# Patient Record
Sex: Male | Born: 1970 | Race: White | Hispanic: No | Marital: Married | Smoking: Former smoker
Health system: Southern US, Community
[De-identification: ages and names within clinical notes are randomized; demographics above are authoritative.]

## PROBLEM LIST (undated history)

## (undated) DIAGNOSIS — M1990 Unspecified osteoarthritis, unspecified site: Secondary | ICD-10-CM

## (undated) DIAGNOSIS — R002 Palpitations: Secondary | ICD-10-CM

## (undated) DIAGNOSIS — E785 Hyperlipidemia, unspecified: Secondary | ICD-10-CM

## (undated) DIAGNOSIS — F419 Anxiety disorder, unspecified: Secondary | ICD-10-CM

## (undated) DIAGNOSIS — M545 Low back pain, unspecified: Secondary | ICD-10-CM

## (undated) DIAGNOSIS — R5381 Other malaise: Secondary | ICD-10-CM

## (undated) DIAGNOSIS — F32A Depression, unspecified: Secondary | ICD-10-CM

## (undated) DIAGNOSIS — R12 Heartburn: Secondary | ICD-10-CM

## (undated) DIAGNOSIS — E559 Vitamin D deficiency, unspecified: Secondary | ICD-10-CM

## (undated) DIAGNOSIS — F329 Major depressive disorder, single episode, unspecified: Secondary | ICD-10-CM

## (undated) DIAGNOSIS — R5383 Other fatigue: Secondary | ICD-10-CM

## (undated) DIAGNOSIS — R319 Hematuria, unspecified: Secondary | ICD-10-CM

## (undated) DIAGNOSIS — K219 Gastro-esophageal reflux disease without esophagitis: Secondary | ICD-10-CM

## (undated) DIAGNOSIS — R7989 Other specified abnormal findings of blood chemistry: Secondary | ICD-10-CM

## (undated) DIAGNOSIS — E119 Type 2 diabetes mellitus without complications: Secondary | ICD-10-CM

## (undated) HISTORY — DX: Other malaise: R53.81

## (undated) HISTORY — DX: Heartburn: R12

## (undated) HISTORY — DX: Other fatigue: R53.83

## (undated) HISTORY — DX: Palpitations: R00.2

## (undated) HISTORY — DX: Low back pain: M54.5

## (undated) HISTORY — DX: Other specified abnormal findings of blood chemistry: R79.89

## (undated) HISTORY — DX: Hematuria, unspecified: R31.9

## (undated) HISTORY — DX: Low back pain, unspecified: M54.50

## (undated) HISTORY — DX: Hyperlipidemia, unspecified: E78.5

## (undated) HISTORY — DX: Vitamin D deficiency, unspecified: E55.9

---

## 2002-02-11 ENCOUNTER — Encounter: Admission: RE | Admit: 2002-02-11 | Discharge: 2002-02-11 | Payer: Self-pay | Admitting: *Deleted

## 2002-02-11 ENCOUNTER — Encounter: Payer: Self-pay | Admitting: *Deleted

## 2003-03-25 ENCOUNTER — Encounter
Admission: RE | Admit: 2003-03-25 | Discharge: 2003-06-23 | Payer: Self-pay | Admitting: Physical Medicine and Rehabilitation

## 2004-02-26 ENCOUNTER — Ambulatory Visit: Payer: Self-pay | Admitting: Internal Medicine

## 2004-03-26 ENCOUNTER — Ambulatory Visit: Payer: Self-pay | Admitting: Internal Medicine

## 2004-11-30 ENCOUNTER — Ambulatory Visit: Payer: Self-pay | Admitting: Internal Medicine

## 2005-04-29 ENCOUNTER — Ambulatory Visit: Payer: Self-pay | Admitting: Internal Medicine

## 2009-09-18 ENCOUNTER — Ambulatory Visit: Payer: Self-pay | Admitting: Internal Medicine

## 2009-09-18 DIAGNOSIS — M5137 Other intervertebral disc degeneration, lumbosacral region: Secondary | ICD-10-CM

## 2009-09-18 DIAGNOSIS — S43429A Sprain of unspecified rotator cuff capsule, initial encounter: Secondary | ICD-10-CM

## 2009-09-18 DIAGNOSIS — K219 Gastro-esophageal reflux disease without esophagitis: Secondary | ICD-10-CM

## 2009-09-18 DIAGNOSIS — G894 Chronic pain syndrome: Secondary | ICD-10-CM | POA: Insufficient documentation

## 2009-09-18 DIAGNOSIS — M545 Low back pain, unspecified: Secondary | ICD-10-CM | POA: Insufficient documentation

## 2009-09-18 DIAGNOSIS — M7730 Calcaneal spur, unspecified foot: Secondary | ICD-10-CM | POA: Insufficient documentation

## 2009-11-12 ENCOUNTER — Telehealth: Payer: Self-pay | Admitting: Internal Medicine

## 2009-11-17 ENCOUNTER — Encounter: Payer: Self-pay | Admitting: Internal Medicine

## 2009-12-14 ENCOUNTER — Telehealth: Payer: Self-pay | Admitting: Internal Medicine

## 2010-03-14 ENCOUNTER — Encounter: Payer: Self-pay | Admitting: Internal Medicine

## 2010-03-23 NOTE — Medication Information (Signed)
Summary: Dates of Oxycodone Dispensed 12-19-08 to 11-17-09  Dates of Oxycodone Dispensed 12-19-08 to 11-17-09   Imported By: Sherian Rein 12/16/2009 11:49:55  _____________________________________________________________________  External Attachment:    Type:   Image     Comment:   External Document

## 2010-03-23 NOTE — Progress Notes (Signed)
Summary: Oxycodone  Phone Note Call from Patient Call back at Home Phone [phone]   Caller: Patient Summary of Call: Pt called stating he has appt scheduled with Dr Robby Sermon at [facility] Dec 9th. Pt is requesting either a 45 day supply of pain med or a 30 day supply and 15 day supply to last until this appt. Please advise.  Appt verified with [facility] 12/09 @ 9:15a Initial call taken by: Margaret Pyle, CMA,  December 14, 2009 2:16 PM  Follow-up for Phone Call        controlled substance database reviewed;  ok for rx as per pt reqeust  done hardcopy to [facility] side B - dahlia  Follow-up by: Corwin Levins MD,  December 14, 2009 2:34 PM  Additional Follow-up for Phone Call Additional follow up Details #1::        Pt informed and Rx in cabinet for pt pick up Additional Follow-up by: Margaret Pyle, CMA,  December 14, 2009 2:42 PM    New/Updated Medications: OXYCODONE HCL 30 MG TABS (OXYCODONE HCL) 1 by mouth every 4 to 6 hours as needed- to fill Dec 15, 2009 OXYCODONE HCL 30 MG TABS (OXYCODONE HCL) 1 by mouth every 4 to 6 hours as needed- to fill Jan 14, 2010 Prescriptions: OXYCODONE HCL 30 MG TABS (OXYCODONE HCL) 1 by mouth every 4 to 6 hours as needed- to fill Jan 14, 2010  #90 x 0   Entered and Authorized by:   Corwin Levins MD   Signed by:   Corwin Levins MD on 12/14/2009   Method used:   Print then Give to Patient   RxID:   [id number] OXYCODONE HCL 30 MG TABS (OXYCODONE HCL) 1 by mouth every 4 to 6 hours as needed- to fill Dec 15, 2009  #180 x 0   Entered and Authorized by:   Corwin Levins MD   Signed by:   Corwin Levins MD on 12/14/2009   Method used:   Print then Give to Patient   RxID:   [id number]

## 2010-03-23 NOTE — Progress Notes (Signed)
Summary: Oxycodone/referral  Phone Note Call from Patient Call back at Home Phone [phone]   Caller: Patient Summary of Call: Pt called stating that he has not heard anything from our office regarding pain clinic referral. I informed pt that [facility] declined eval but a new referral was done to [facility]. Pt stated that he referred himself to another clinic but he willnot be able to be seen until Dec and he will be out of pain meds on Monday. Pt was a little upset that he was not informed about referral status. Please advise. Initial call taken by: Margaret Pyle, CMA,  November 12, 2009 11:25 AM  Follow-up for Phone Call        please ask for name of pain clinic and verify  rx done hardcopy to [facility] side B - dahlia  Follow-up by: Corwin Levins MD,  November 12, 2009 12:38 PM  Additional Follow-up for Phone Call Additional follow up Details #1::        Pt informed, Rx in cabinet for pt pick up [facility] Dr Gale Journey. Additional Follow-up by: Margaret Pyle, CMA,  November 12, 2009 1:41 PM    New/Updated Medications: OXYCODONE HCL 30 MG TABS (OXYCODONE HCL) 1 by mouth every 4 to 6 hours as needed- to fill sept 26, 2011 Prescriptions: OXYCODONE HCL 30 MG TABS (OXYCODONE HCL) 1 by mouth every 4 to 6 hours as needed- to fill sept 26, 2011  #180 x 0   Entered and Authorized by:   Corwin Levins MD   Signed by:   Corwin Levins MD on 11/12/2009   Method used:   Print then Give to Patient   RxID:   [id number]
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
## 2010-03-26 ENCOUNTER — Encounter: Payer: Self-pay | Admitting: Internal Medicine

## 2010-04-08 NOTE — Letter (Signed)
Summary: [facility]   Imported By: Sherian Rein 04/01/2010 10:39:51  _____________________________________________________________________  External Attachment:    Type:   Image     Comment:   External Document

## 2010-07-09 NOTE — Group Therapy Note (Signed)
REASON FOR CONSULTATION:  Degenerative disk disease.   CHIEF COMPLAINT:  Chronic low back pain.   HISTORY OF PRESENT ILLNESS:  Taylor Kidd is a 40 year old gentleman who has  a 1 to 2-year history of progressive low back pain.  He has had an MRI back  in December, 2003 which showed broad based disk possibly pushing on the  thecal sac in either S1 nerve root.   He denies any bowel or bladder symptoms, denies any numbness or tingling,  denies any weakness.  He denies any gait problems.  He has had some problem  with a shoulder in the past but it has improved somewhat.  His back is his  main problem, it goes up to about a 7 on a scale of 10.  He does not have  any kind of leg pain.  He is a high functioning gentleman.  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  He was a previous patient of  Dr. Lahoma Rocker and had been on OxyContin and Lorcet.  He is asking if he can  have these medications again.  He overall is very high functioning.  In  addition to working he at times has time to do some exercises which include  leg lifts and free weights.  Overall his emotional status is good.  He has  good coping skills.  Denies harm to self or others.  Denies any suicidal  ideation.   SOCIAL HISTORY:  The patient has been married for 10 years, has a 9-year-  old, an 34-year-old and a 28-year-old.  The last time he worked was today.  He  denies alcohol and illegal drug use.  He does smoke a half-pack of  cigarettes a day.   FAMILY HISTORY:  Mother and father are both age 29 and he denies any medical  problems with them.  He has a brother who is also healthy.   ALLERGIES:  None.   MEDICATIONS:  None.   PAST MEDICAL HISTORY:  Is unremarkable.   PAST SURGICAL HISTORY:  Unremarkable.   REVIEW OF SYSTEMS:  Negative for fever, chills, sweats, weight gain or  weight loss.  No problems with diabetes,  ulcers, cancers, kidney problems,  thyroid problems, heart problems or high blood pressure.   PHYSICAL EXAMINATION:  He appeared well-groomed, comfortable.  He is a well-  developed, well-nourished gentleman in no apparent distress.  He is  cooperative as well as appropriate during our interview.  His blood pressure  is 133/76, pulse of 100, with 97% saturation on room air.   He was able to walk in the room without any difficulty, was able to get of  the chair without any difficulty.  His cranial nerves were grossly intact.  Sensory exam was normal to pin prick.  Romberg's test was negative.  Gait  was normal.  Seated, reflexes were 2+ upper and lower extremities.  Toes  were down going, no clonus was noted.  Motor strength is 5/5 in lower  extremities, no obvious wasting was noted in the lower extremities, no  spasticity or tremors were noted.  He had a negative straight leg raise.   IMPRESSION:  1. 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  2. Bilateral shoulder pain which is mild as well.   This patient's pain had been controlled by Dr. Lew Dawes using 80 mg of  OxyContin t.i.d. which  would equal 240 mg per day of OxyContin.  He was also  given some breakthrough medication to go along with this.   PLAN:  We will be switching Taylor Kidd over from his OxyContin to a  Duragesic patch, he is given one 100 mcg/Hr. patch as well as a 25 mcg/Hr.  patch, he will use the 2 together.  He was given 5 units of each in his  prescription.  We will follow him back up in approximately one month.  He  will call and let us know if he has any problems with this.  He tells me he  has some Oxycodone left over.  He has about 100 tablets of 15 mg Oxycodone  which he can use for breakthrough.   Will see him back in one month.     Taylor Kidd, M.D.   DMK/MedQ  D:  03/26/2003 18:02:00  T:  03/26/2003 18:54:21  Job #:  [id number]   cc:   Corwin Levins, M.D. [facility]

## 2012-05-15 ENCOUNTER — Other Ambulatory Visit: Payer: Self-pay | Admitting: *Deleted

## 2012-05-15 MED ORDER — OXYCODONE HCL 30 MG PO TABS: ORAL_TABLET | ORAL | Status: DC

## 2012-05-28 ENCOUNTER — Other Ambulatory Visit: Payer: BC Managed Care – PPO

## 2012-05-28 ENCOUNTER — Other Ambulatory Visit: Payer: Self-pay | Admitting: *Deleted

## 2012-05-28 DIAGNOSIS — R739 Hyperglycemia, unspecified: Secondary | ICD-10-CM

## 2012-05-28 DIAGNOSIS — E785 Hyperlipidemia, unspecified: Secondary | ICD-10-CM

## 2012-05-28 DIAGNOSIS — R5383 Other fatigue: Secondary | ICD-10-CM

## 2012-05-29 LAB — CBC WITH DIFFERENTIAL/PLATELET
Eos: 2 % (ref 0–5)
Eosinophils Absolute: 0.2 10*3/uL (ref 0.0–0.4)
Immature Grans (Abs): 0.1 10*3/uL (ref 0.0–0.1)
Immature Granulocytes: 1 % (ref 0–2)
MCV: 80 fL (ref 79–97)
Monocytes Absolute: 0.5 10*3/uL (ref 0.1–0.9)
Neutrophils Relative %: 68 % (ref 40–74)
RDW: 12.7 % (ref 12.3–15.4)
WBC: 8.6 10*3/uL (ref 3.4–10.8)

## 2012-05-29 LAB — COMPREHENSIVE METABOLIC PANEL
ALT: 29 IU/L (ref 0–44)
AST: 22 IU/L (ref 0–40)
Albumin/Globulin Ratio: 1.5 (ref 1.1–2.5)
Albumin: 4.3 g/dL (ref 3.5–5.5)
Alkaline Phosphatase: 114 IU/L (ref 39–117)
BUN/Creatinine Ratio: 18 (ref 9–20)
BUN: 16 mg/dL (ref 6–24)
CO2: 25 mmol/L (ref 19–28)
Calcium: 9.5 mg/dL (ref 8.7–10.2)
Chloride: 99 mmol/L (ref 97–108)
Creatinine, Ser: 0.9 mg/dL (ref 0.76–1.27)
GFR calc Af Amer: 122 mL/min/{1.73_m2} (ref >59)
GFR calc non Af Amer: 106 mL/min/{1.73_m2} (ref >59)
Globulin, Total: 2.9 g/dL (ref 1.5–4.5)
Glucose: 107 mg/dL — ABNORMAL HIGH (ref 65–99)
Potassium: 4.4 mmol/L (ref 3.5–5.2)
Sodium: 139 mmol/L (ref 134–144)
Total Bilirubin: 0.3 mg/dL (ref 0.0–1.2)
Total Protein: 7.2 g/dL (ref 6.0–8.5)

## 2012-05-29 LAB — LIPID PANEL
HDL: 41 mg/dL (ref >39)
VLDL Cholesterol Cal: 54 mg/dL — ABNORMAL HIGH (ref 5–40)

## 2012-06-11 ENCOUNTER — Other Ambulatory Visit: Payer: Self-pay | Admitting: *Deleted

## 2012-06-11 MED ORDER — OXYCODONE HCL 30 MG PO TABS: ORAL_TABLET | ORAL | Status: DC

## 2012-07-09 ENCOUNTER — Other Ambulatory Visit: Payer: Self-pay | Admitting: *Deleted

## 2012-07-09 MED ORDER — OXYCODONE HCL 30 MG PO TABS: ORAL_TABLET | ORAL | Status: DC

## 2012-08-03 ENCOUNTER — Other Ambulatory Visit: Payer: Self-pay | Admitting: *Deleted

## 2012-08-06 ENCOUNTER — Other Ambulatory Visit: Payer: Self-pay | Admitting: Geriatric Medicine

## 2012-08-06 MED ORDER — OXYCODONE HCL 30 MG PO TABS: ORAL_TABLET | ORAL | Status: DC

## 2012-09-03 ENCOUNTER — Other Ambulatory Visit: Payer: Self-pay | Admitting: Geriatric Medicine

## 2012-09-04 ENCOUNTER — Other Ambulatory Visit: Payer: Self-pay | Admitting: Geriatric Medicine

## 2012-09-04 MED ORDER — OXYCODONE HCL 30 MG PO TABS: ORAL_TABLET | ORAL | Status: DC

## 2012-10-01 ENCOUNTER — Other Ambulatory Visit: Payer: Self-pay | Admitting: *Deleted

## 2012-10-01 MED ORDER — OXYCODONE HCL 30 MG PO TABS: ORAL_TABLET | ORAL | Status: DC

## 2012-10-02 ENCOUNTER — Other Ambulatory Visit: Payer: Self-pay | Admitting: Geriatric Medicine

## 2012-10-02 MED ORDER — OXYCODONE HCL 30 MG PO TABS: ORAL_TABLET | ORAL | Status: DC

## 2012-10-17 ENCOUNTER — Encounter: Payer: Self-pay | Admitting: *Deleted

## 2012-10-17 ENCOUNTER — Ambulatory Visit: Payer: BC Managed Care – PPO | Admitting: Internal Medicine

## 2012-10-30 ENCOUNTER — Other Ambulatory Visit: Payer: Self-pay | Admitting: *Deleted

## 2012-10-30 MED ORDER — OXYCODONE HCL 30 MG PO TABS: ORAL_TABLET | ORAL | Status: DC

## 2012-11-26 ENCOUNTER — Other Ambulatory Visit: Payer: Self-pay | Admitting: *Deleted

## 2012-11-26 MED ORDER — OXYCODONE HCL 30 MG PO TABS: ORAL_TABLET | ORAL | Status: DC

## 2012-12-24 ENCOUNTER — Other Ambulatory Visit: Payer: Self-pay | Admitting: *Deleted

## 2012-12-26 ENCOUNTER — Other Ambulatory Visit: Payer: Self-pay | Admitting: *Deleted

## 2012-12-26 MED ORDER — OXYCODONE HCL 30 MG PO TABS: ORAL_TABLET | ORAL | Status: DC

## 2013-01-23 ENCOUNTER — Other Ambulatory Visit: Payer: Self-pay | Admitting: *Deleted

## 2013-01-23 MED ORDER — OXYCODONE HCL 30 MG PO TABS: ORAL_TABLET | ORAL | Status: DC

## 2013-02-19 ENCOUNTER — Other Ambulatory Visit: Payer: Self-pay | Admitting: *Deleted

## 2013-02-19 MED ORDER — OXYCODONE HCL 30 MG PO TABS: ORAL_TABLET | ORAL | Status: DC

## 2013-03-20 ENCOUNTER — Other Ambulatory Visit: Payer: Self-pay | Admitting: *Deleted

## 2013-03-20 MED ORDER — OXYCODONE HCL 30 MG PO TABS: ORAL_TABLET | ORAL | Status: DC

## 2013-04-16 ENCOUNTER — Other Ambulatory Visit: Payer: Self-pay | Admitting: *Deleted

## 2013-04-16 MED ORDER — OXYCODONE HCL 30 MG PO TABS: ORAL_TABLET | ORAL | Status: DC

## 2013-04-16 NOTE — Telephone Encounter (Signed)
Patient picked up 2 days early due to the weather and snow.

## 2013-05-08 ENCOUNTER — Encounter: Payer: Self-pay | Admitting: Internal Medicine

## 2013-05-13 ENCOUNTER — Other Ambulatory Visit: Payer: Self-pay | Admitting: *Deleted

## 2013-05-13 MED ORDER — OXYCODONE HCL 30 MG PO TABS: ORAL_TABLET | ORAL | Status: DC

## 2013-06-12 ENCOUNTER — Other Ambulatory Visit: Payer: Self-pay | Admitting: *Deleted

## 2013-06-12 MED ORDER — OXYCODONE HCL 30 MG PO TABS: ORAL_TABLET | ORAL | Status: DC

## 2013-06-21 ENCOUNTER — Other Ambulatory Visit: Payer: Self-pay | Admitting: *Deleted

## 2013-06-21 ENCOUNTER — Other Ambulatory Visit: Payer: BC Managed Care – PPO

## 2013-06-21 DIAGNOSIS — Z0000 Encounter for general adult medical examination without abnormal findings: Secondary | ICD-10-CM

## 2013-06-22 LAB — COMPREHENSIVE METABOLIC PANEL
ALBUMIN: 4.4 g/dL (ref 3.5–5.5)
ALT: 21 IU/L (ref 0–44)
AST: 20 IU/L (ref 0–40)
Albumin/Globulin Ratio: 1.8 (ref 1.1–2.5)
Alkaline Phosphatase: 115 IU/L (ref 39–117)
BUN/Creatinine Ratio: 13 (ref 9–20)
BUN: 12 mg/dL (ref 6–24)
CALCIUM: 9.5 mg/dL (ref 8.7–10.2)
CO2: 25 mmol/L (ref 18–29)
CREATININE: 0.92 mg/dL (ref 0.76–1.27)
Chloride: 100 mmol/L (ref 97–108)
GFR calc Af Amer: 118 mL/min/{1.73_m2} (ref >59)
GFR, EST NON AFRICAN AMERICAN: 102 mL/min/{1.73_m2} (ref >59)
GLOBULIN, TOTAL: 2.4 g/dL (ref 1.5–4.5)
Glucose: 93 mg/dL (ref 65–99)
Potassium: 4.5 mmol/L (ref 3.5–5.2)
Sodium: 139 mmol/L (ref 134–144)
TOTAL PROTEIN: 6.8 g/dL (ref 6.0–8.5)
Total Bilirubin: 0.4 mg/dL (ref 0.0–1.2)

## 2013-06-22 LAB — CBC WITH DIFFERENTIAL/PLATELET
BASOS ABS: 0 10*3/uL (ref 0.0–0.2)
Basos: 0 %
EOS ABS: 0.1 10*3/uL (ref 0.0–0.4)
Eos: 1 %
HCT: 44 % (ref 37.5–51.0)
HEMOGLOBIN: 14.9 g/dL (ref 12.6–17.7)
Immature Grans (Abs): 0 10*3/uL (ref 0.0–0.1)
Immature Granulocytes: 0 %
LYMPHS ABS: 2.5 10*3/uL (ref 0.7–3.1)
Lymphs: 26 %
MCH: 26.8 pg (ref 26.6–33.0)
MCHC: 33.9 g/dL (ref 31.5–35.7)
MCV: 79 fL (ref 79–97)
MONOS ABS: 0.5 10*3/uL (ref 0.1–0.9)
Monocytes: 5 %
NEUTROS ABS: 6.5 10*3/uL (ref 1.4–7.0)
Neutrophils Relative %: 68 %
RBC: 5.56 x10E6/uL (ref 4.14–5.80)
RDW: 13.6 % (ref 12.3–15.4)
WBC: 9.7 10*3/uL (ref 3.4–10.8)

## 2013-06-22 LAB — VITAMIN D 25 HYDROXY (VIT D DEFICIENCY, FRACTURES): VIT D 25 HYDROXY: 24.7 ng/mL — AB (ref 30.0–100.0)

## 2013-06-22 LAB — PSA: PSA: 0.5 ng/mL (ref 0.0–4.0)

## 2013-06-26 ENCOUNTER — Encounter: Payer: Self-pay | Admitting: Internal Medicine

## 2013-06-26 ENCOUNTER — Ambulatory Visit (INDEPENDENT_AMBULATORY_CARE_PROVIDER_SITE_OTHER): Payer: BC Managed Care – PPO | Admitting: Internal Medicine

## 2013-06-26 VITALS — BP 142/78 | HR 85 | Temp 98.5°F | Ht 75.0 in | Wt 258.0 lb

## 2013-06-26 DIAGNOSIS — M545 Low back pain, unspecified: Secondary | ICD-10-CM

## 2013-06-26 DIAGNOSIS — K219 Gastro-esophageal reflux disease without esophagitis: Secondary | ICD-10-CM

## 2013-06-26 DIAGNOSIS — R739 Hyperglycemia, unspecified: Secondary | ICD-10-CM

## 2013-06-26 DIAGNOSIS — M51379 Other intervertebral disc degeneration, lumbosacral region without mention of lumbar back pain or lower extremity pain: Secondary | ICD-10-CM

## 2013-06-26 DIAGNOSIS — E785 Hyperlipidemia, unspecified: Secondary | ICD-10-CM

## 2013-06-26 DIAGNOSIS — M5430 Sciatica, unspecified side: Secondary | ICD-10-CM

## 2013-06-26 DIAGNOSIS — G894 Chronic pain syndrome: Secondary | ICD-10-CM

## 2013-06-26 DIAGNOSIS — E559 Vitamin D deficiency, unspecified: Secondary | ICD-10-CM

## 2013-06-26 DIAGNOSIS — M5137 Other intervertebral disc degeneration, lumbosacral region: Secondary | ICD-10-CM

## 2013-06-26 DIAGNOSIS — R7309 Other abnormal glucose: Secondary | ICD-10-CM

## 2013-06-26 DIAGNOSIS — K648 Other hemorrhoids: Secondary | ICD-10-CM

## 2013-06-26 NOTE — Patient Instructions (Signed)
Use Vitamin D daily.

## 2013-06-26 NOTE — Progress Notes (Signed)
Patient ID: Taylor MediciDavid Kidd, male   DOB: 11-27-1970, 43 y.o.   MRN: [id number]    Location:    [facility]  Place of Service:  OFFICE  PCP: Kimber RelicGREEN, Siddarth Hsiung G, MD  Code Status: LIVING WILL  Extended Emergency Contact Information Primary Emergency Contact: Mirkin,TONYA Address: [address]          [place],  [id number] Home Phone: [phone] Relation: None  No Known Allergies  Chief Complaint  Patient presents with  . Annual Exam    physical & discuss labs (printed)  . other    pain in LT buttocks/leg when flexingLT foot & groin area x 3 months.    HPI:  Unspecified vitamin D deficiency - continues to take Vitamin D, 25-hydroxy  Chronic pain syndrome: continues to benefit from his pain medications. He asks about adding a long acting pain medication. He continues to drive and work for [employer] event though he is using oxycodone.  LOW BACK PAIN, CHRONIC - persistent. seems to be mainly in the right lower area. He does stretching exercises that he has been taught like Williams Flexion exercises. He has been going to [place] pain [place] [facility] [place] called [facility] a the division of [facility]. He is using oxycodone and OxyContin 40 mg tablets prescribed by [facility]. His contact there is Haskell RilingHeather [place]arrison, [place]. He has never had surgery of his back.  Current pain meds seem to control pain most of the time.  Pain is now reported as worst in the left buttocks and leg especially when flexing the left foot and groin area. Pains seemed to increase about 3 months ago. Using Aleve, but it does not provide relief. Oxycodone helps. Has had steroid injections in the past. They did not help.   DISC DISEASE, LUMBAR: contributes to his chronic back pain.  GERD: Using Tums  a couple of days each week.  Sciatica: pain down the left leg  Hyperglycemia - mild and diet controlled  Other and unspecified hyperlipidemia - controlled  Internal hemorrhoids without  mention of complication: no bleeding or pain      Past Medical History  Diagnosis Date  . Unspecified vitamin D deficiency   . Other abnormal blood chemistry   . Unspecified essential hypertension   . Hematuria, unspecified   . Lumbago   . Other malaise and fatigue   . Palpitations   . Heartburn     History reviewed. No pertinent past surgical history.  CONSULTANTS Orthopedic: Farris HasKramer Urol: Annabell HowellsWrenn Podiatry:  PAST PROCEDURES 02/11/2002 MRI Lumbar Spine, single level pathology at L5-S1 with a moderate sized broad based disc herniation which indents. The ventral aspect of the thecal sac and could affect either S1 nerve root. 02/11/2002 MRI Thoracic Spine, small central to right sided disc herniation at T7-8 of questionable s significance.Minimal right sided spondylosis at T3-4 of questionable significance.  2013 Cystoscopy (Dr. Annabell HowellsWrenn) for microscopic hematuria. No abnormalities.   Social History: History   Social History  . Marital Status: Married    Spouse Name: N/A    Number of Children: N/A  . Years of Education: N/A   Social History Main Topics  . Smoking status: Current Every Day Smoker -- 0.50 packs/day    Types: Cigarettes  . Smokeless tobacco: None  . Alcohol Use: Yes     Comment: socially/moderation  . Drug Use: No  . Sexual Activity: None   Other Topics Concern  . None   Social History Narrative  . None  Family History Family Status  Relation Status Death Age  . Mother Alive   . Father Alive   . Brother Alive   . Daughter Alive   . Son Alive   . Daughter Alive    History reviewed. No pertinent family history.   Medications: Patient's Medications  New Prescriptions   No medications on file  Previous Medications   CHOLECALCIFEROL (VITAMIN D-3) 5000 UNITS TABS    Take 5,000 Units by mouth. Take 1 tablet on Mon, Wed, Fri for vitamin supplement.   OXYCODONE (ROXICODONE) 30 MG IMMEDIATE RELEASE TABLET    Take one tablet every 3 hours daily  as needed for pain  Modified Medications   No medications on file  Discontinued Medications   TESTOSTERONE (AXIRON) 30 MG/ACT SOLN    Place onto the skin. Apply daily under the arm to supplement testosterone.    Immunization History  Administered Date(s) Administered  . Td 08/22/2007     Review of Systems  Constitutional: Positive for fatigue.  HENT: Negative.   Eyes: Negative.   Respiratory: Negative.   Cardiovascular: Positive for palpitations (mild. No pains).  Gastrointestinal: Negative.   Endocrine: Negative.   Genitourinary: Negative.   Musculoskeletal: Positive for back pain and myalgias.  Skin: Negative.   Allergic/Immunologic: Negative.   Neurological: Negative.   Hematological: Negative.   Psychiatric/Behavioral: Negative.       Filed Vitals:   06/26/13 1355  BP: 142/78  Pulse: 85  Temp: 98.5 F (36.9 C)  TempSrc: Oral  Height: 6\' 3"  (1.905 m)  Weight: 258 lb (117.028 kg)  SpO2: 96%   Physical Exam  Constitutional: He is oriented to person, place, and time. He appears well-developed and well-nourished. No distress.  HENT:  Right Ear: External ear normal.  Left Ear: External ear normal.  Nose: Nose normal.  Mouth/Throat: Oropharynx is clear and moist. No oropharyngeal exudate.  Eyes: Conjunctivae and EOM are normal. Pupils are equal, round, and reactive to light.  Neck: No JVD present. No tracheal deviation present. No thyromegaly present.  Cardiovascular: Normal rate, regular rhythm, normal heart sounds and intact distal pulses.  Exam reveals no gallop and no friction rub.   No murmur heard. Respiratory: No respiratory distress. He has no wheezes. He has no rales. He exhibits no tenderness.  GI: He exhibits no distension. There is no tenderness. There is no rebound.  Genitourinary: Rectum normal, prostate normal and penis normal. Guaiac negative stool. No penile tenderness.  Musculoskeletal: Normal range of motion. He exhibits no edema and no  tenderness.  Lymphadenopathy:    He has no cervical adenopathy.  Neurological: He is alert and oriented to person, place, and time. He has normal reflexes. No cranial nerve deficit. Coordination normal.  Skin: No rash noted. No erythema. No pallor.  Psychiatric: He has a normal mood and affect. His behavior is normal. Judgment and thought content normal.       Labs reviewed: Appointment on 06/21/2013  Component Date Value Ref Range Status  . WBC 06/21/2013 9.7  3.4 - 10.8 x10E3/uL Final  . RBC 06/21/2013 5.56  4.14 - 5.80 x10E6/uL Final  . Hemoglobin 06/21/2013 14.9  12.6 - 17.7 g/dL Final  . HCT 16/10/960405/02/2013 44.0  37.5 - 51.0 % Final  . MCV 06/21/2013 79  79 - 97 fL Final  . MCH 06/21/2013 26.8  26.6 - 33.0 pg Final  . MCHC 06/21/2013 33.9  31.5 - 35.7 g/dL Final  . RDW 54/09/811905/02/2013 13.6  12.3 - 15.4 %  Final  . Neutrophils Relative % 06/21/2013 68   Final  . Lymphs 06/21/2013 26   Final  . Monocytes 06/21/2013 5   Final  . Eos 06/21/2013 1   Final  . Basos 06/21/2013 0   Final  . Neutrophils Absolute 06/21/2013 6.5  1.4 - 7.0 x10E3/uL Final  . Lymphocytes Absolute 06/21/2013 2.5  0.7 - 3.1 x10E3/uL Final  . Monocytes Absolute 06/21/2013 0.5  0.1 - 0.9 x10E3/uL Final  . Eosinophils Absolute 06/21/2013 0.1  0.0 - 0.4 x10E3/uL Final  . Basophils Absolute 06/21/2013 0.0  0.0 - 0.2 x10E3/uL Final  . Immature Granulocytes 06/21/2013 0   Final  . Immature Grans (Abs) 06/21/2013 0.0  0.0 - 0.1 x10E3/uL Final  . Glucose 06/21/2013 93  65 - 99 mg/dL Final  . BUN 16/11/9602 12  6 - 24 mg/dL Final  . Creatinine, Ser 06/21/2013 0.92  0.76 - 1.27 mg/dL Final  . GFR calc non Af Amer 06/21/2013 102  >59 mL/min/1.73 Final  . GFR calc Af Amer 06/21/2013 118  >59 mL/min/1.73 Final  . BUN/Creatinine Ratio 06/21/2013 13  9 - 20 Final  . Sodium 06/21/2013 139  134 - 144 mmol/L Final  . Potassium 06/21/2013 4.5  3.5 - 5.2 mmol/L Final  . Chloride 06/21/2013 100  97 - 108 mmol/L Final  . CO2  06/21/2013 25  18 - 29 mmol/L Final  . Calcium 06/21/2013 9.5  8.7 - 10.2 mg/dL Final  . Total Protein 06/21/2013 6.8  6.0 - 8.5 g/dL Final  . Albumin 54/10/8117 4.4  3.5 - 5.5 g/dL Final  . Globulin, Total 06/21/2013 2.4  1.5 - 4.5 g/dL Final  . Albumin/Globulin Ratio 06/21/2013 1.8  1.1 - 2.5 Final  . Total Bilirubin 06/21/2013 0.4  0.0 - 1.2 mg/dL Final  . Alkaline Phosphatase 06/21/2013 115  39 - 117 IU/L Final  . AST 06/21/2013 20  0 - 40 IU/L Final  . ALT 06/21/2013 21  0 - 44 IU/L Final  . PSA 06/21/2013 0.5  0.0 - 4.0 ng/mL Final   Comment: Roche ECLIA methodology.                          According to the American Urological Association, Serum PSA should                          decrease and remain at undetectable levels after radical                          prostatectomy. The AUA defines biochemical recurrence as an initial                          PSA value 0.2 ng/mL or greater followed by a subsequent confirmatory                          PSA value 0.2 ng/mL or greater.                          Values obtained with different assay methods or kits cannot be used                          interchangeably. Results cannot be interpreted as absolute evidence  of the presence or absence of malignant disease.  . Vit D, 25-Hydroxy 06/21/2013 24.7* 30.0 - 100.0 ng/mL Final   Comment: Vitamin D deficiency has been defined by the Institute of                          Medicine and an Endocrine Society practice guideline as a                          level of serum 25-OH vitamin D less than 20 ng/mL (1,2).                          The Endocrine Society went on to further define vitamin D                          insufficiency as a level between 21 and 29 ng/mL (2).                          1. IOM (Institute of Medicine). 2010. Dietary reference                             intakes for calcium and D. [place]: The                             [employer].                          2. Holick MF, Binkley [place], Bischoff-Ferrari HA, et al.                             Evaluation, treatment, and prevention of vitamin D                             deficiency: an Endocrine Society clinical practice                             guideline. JCEM. 2011 Jul; 96(7):1911-30.     Assessment/Plan  1. Unspecified vitamin D deficiency Take supplement regularly - Vitamin D, 25-hydroxy; Future  2. Chronic pain syndrome Continue oxycodone  3. LOW BACK PAIN, CHRONIC Continue oxycodone - PSA; Future  4. DISC DISEASE, LUMBAR Offered to arrange referral to neurosurgeon, but he refused.  5. GERD Continue Tums as needed  6. Sciatica refer to neurologist when he is ready  7. Hyperglycemia Controlled with diet alone - Comprehensive metabolic panel; Future  8. Other and unspecified hyperlipidemia controlled - Lipid panel; Future  9. Internal hemorrhoids without mention of complication observe

## 2013-07-11 ENCOUNTER — Other Ambulatory Visit: Payer: Self-pay | Admitting: *Deleted

## 2013-07-11 MED ORDER — OXYCODONE HCL 30 MG PO TABS: ORAL_TABLET | ORAL | Status: DC

## 2013-08-08 ENCOUNTER — Other Ambulatory Visit: Payer: Self-pay | Admitting: *Deleted

## 2013-08-08 MED ORDER — OXYCODONE HCL 30 MG PO TABS: ORAL_TABLET | ORAL | Status: DC

## 2013-09-05 ENCOUNTER — Other Ambulatory Visit: Payer: Self-pay | Admitting: *Deleted

## 2013-09-05 MED ORDER — OXYCODONE HCL 30 MG PO TABS: ORAL_TABLET | ORAL | Status: DC

## 2013-09-05 NOTE — Telephone Encounter (Signed)
Patient Requested 

## 2013-10-03 ENCOUNTER — Other Ambulatory Visit: Payer: Self-pay | Admitting: *Deleted

## 2013-10-03 MED ORDER — OXYCODONE HCL 30 MG PO TABS: ORAL_TABLET | ORAL | Status: DC

## 2013-10-31 ENCOUNTER — Other Ambulatory Visit: Payer: Self-pay | Admitting: *Deleted

## 2013-10-31 MED ORDER — OXYCODONE HCL 30 MG PO TABS: ORAL_TABLET | ORAL | Status: DC

## 2013-10-31 NOTE — Telephone Encounter (Signed)
Patient Requested and Taylor Kidd will pick up

## 2013-11-27 ENCOUNTER — Other Ambulatory Visit: Payer: Self-pay | Admitting: *Deleted

## 2013-11-27 MED ORDER — OXYCODONE HCL 30 MG PO TABS: ORAL_TABLET | ORAL | Status: DC

## 2013-11-27 NOTE — Telephone Encounter (Signed)
Patient Requested and will pick up. Patient is leaving for [place] in the morning with [employer].

## 2013-12-25 ENCOUNTER — Other Ambulatory Visit: Payer: Self-pay | Admitting: *Deleted

## 2013-12-25 MED ORDER — OXYCODONE HCL 30 MG PO TABS: ORAL_TABLET | ORAL | Status: DC

## 2013-12-25 NOTE — Telephone Encounter (Signed)
Patient Requested and Taylor EvertWilliam Kidd will pick up

## 2014-01-21 ENCOUNTER — Other Ambulatory Visit: Payer: Self-pay | Admitting: *Deleted

## 2014-01-21 MED ORDER — OXYCODONE HCL 30 MG PO TABS: ORAL_TABLET | ORAL | Status: DC

## 2014-01-21 NOTE — Telephone Encounter (Signed)
Patient called and requested and will pick up. Patient is going out of town tonight with [employer] to [place] and will not be able to pick up on Friday.

## 2014-02-18 ENCOUNTER — Other Ambulatory Visit: Payer: Self-pay | Admitting: *Deleted

## 2014-02-18 MED ORDER — OXYCODONE HCL 30 MG PO TABS: ORAL_TABLET | ORAL | Status: DC

## 2014-02-18 NOTE — Telephone Encounter (Signed)
Patient Requested and will pick up 

## 2014-03-17 ENCOUNTER — Other Ambulatory Visit: Payer: Self-pay | Admitting: *Deleted

## 2014-03-17 MED ORDER — OXYCODONE HCL 30 MG PO TABS: ORAL_TABLET | ORAL | Status: DC
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
## 2014-04-08 ENCOUNTER — Encounter: Payer: Self-pay | Admitting: Internal Medicine

## 2014-04-08 ENCOUNTER — Ambulatory Visit (INDEPENDENT_AMBULATORY_CARE_PROVIDER_SITE_OTHER): Payer: BLUE CROSS/BLUE SHIELD | Admitting: Internal Medicine

## 2014-04-08 ENCOUNTER — Other Ambulatory Visit: Payer: Self-pay | Admitting: *Deleted

## 2014-04-08 VITALS — BP 136/64 | HR 101 | Temp 98.3°F | Resp 20 | Ht 75.0 in | Wt 233.4 lb

## 2014-04-08 DIAGNOSIS — F17200 Nicotine dependence, unspecified, uncomplicated: Secondary | ICD-10-CM

## 2014-04-08 DIAGNOSIS — M5432 Sciatica, left side: Secondary | ICD-10-CM

## 2014-04-08 DIAGNOSIS — M545 Low back pain: Secondary | ICD-10-CM

## 2014-04-08 DIAGNOSIS — M51379 Other intervertebral disc degeneration, lumbosacral region without mention of lumbar back pain or lower extremity pain: Secondary | ICD-10-CM

## 2014-04-08 DIAGNOSIS — F411 Generalized anxiety disorder: Secondary | ICD-10-CM

## 2014-04-08 DIAGNOSIS — Z720 Tobacco use: Secondary | ICD-10-CM

## 2014-04-08 DIAGNOSIS — M5137 Other intervertebral disc degeneration, lumbosacral region: Secondary | ICD-10-CM

## 2014-04-08 DIAGNOSIS — R739 Hyperglycemia, unspecified: Secondary | ICD-10-CM

## 2014-04-08 DIAGNOSIS — E785 Hyperlipidemia, unspecified: Secondary | ICD-10-CM

## 2014-04-08 DIAGNOSIS — F329 Major depressive disorder, single episode, unspecified: Secondary | ICD-10-CM | POA: Insufficient documentation

## 2014-04-08 DIAGNOSIS — G894 Chronic pain syndrome: Secondary | ICD-10-CM

## 2014-04-08 DIAGNOSIS — F32A Depression, unspecified: Secondary | ICD-10-CM

## 2014-04-08 MED ORDER — LEVOMILNACIPRAN HCL ER 80 MG PO CP24: ORAL_CAPSULE | ORAL | Status: DC

## 2014-04-08 MED ORDER — OXYCODONE HCL 30 MG PO TABS: ORAL_TABLET | ORAL | Status: DC

## 2014-04-08 MED ORDER — OXYCODONE HCL ER 30 MG PO T12A: EXTENDED_RELEASE_TABLET | ORAL | Status: DC

## 2014-04-08 NOTE — Telephone Encounter (Signed)
Patient requested at appointment 04/08/2014 with Dr. Chilton SiGreen and Dr. Chilton SiGreen approved to print for patient. Printed and Dr. Chilton SiGreen signed.

## 2014-04-08 NOTE — Progress Notes (Signed)
Patient ID: Taylor Kidd, male   DOB: 08/28/1970, 44 y.o.   MRN: [id number]    Facility  [facility]    Place of Service:   OFFICE   No Known Allergies  Chief Complaint  Patient presents with  . Acute Visit    HPI:   Chronic pain syndrome: Chronic low back discomfort related to lumbar disc disease. Some radiation down the left leg. No loss of strength.  Hyperglycemia: Last lab done May 2015. Follow-up labs be done next visit.  Hyperlipidemia: Follow-up labs be done next visit  Sciatica, left: Discomfort intermittently down the left leg  Anxiety state: Distressed about his marriage failing  Tobacco use disorder: Continues to smoke.   Social Patient and his wife have separated. She has moved out. He is living in a house with his children. He says he still loves her. It is not certain whether there is a chance to get back together. He is distressed about this and sad.   Medications: Patient's Medications  New Prescriptions   No medications on file  Previous Medications   CHOLECALCIFEROL (VITAMIN D-3) 5000 UNITS TABS    Take 5,000 Units by mouth. Take 1 tablet on Mon, Wed, Fri for vitamin supplement.   OXYCODONE (ROXICODONE) 30 MG IMMEDIATE RELEASE TABLET    Take one tablet every 3 hours daily as needed for pain  Modified Medications   No medications on file  Discontinued Medications   No medications on file     Review of Systems  Constitutional: Positive for fatigue.  HENT: Negative.   Eyes: Negative.   Respiratory: Negative.   Cardiovascular: Positive for palpitations (mild. No pains).  Gastrointestinal: Negative.   Endocrine: Negative.   Genitourinary: Negative.   Musculoskeletal: Positive for myalgias and back pain.  Skin: Negative.   Allergic/Immunologic: Negative.   Neurological: Negative.   Hematological: Negative.   Psychiatric/Behavioral: Positive for dysphoric mood.    Filed Vitals:   04/08/14 1558  BP: 136/64  Pulse: 101  Temp: 98.3 F (36.8 C)    TempSrc: Oral  Resp: 20  Height:  (1.905 m)  Weight: 233 lb 6.4 oz (105.87 kg)  SpO2: 98%   Body mass index is 29.17 kg/(m^2).  Physical Exam  Constitutional: He is oriented to person, place, and time. He appears well-developed and well-nourished. No distress.  HENT:  Right Ear: External ear normal.  Left Ear: External ear normal.  Nose: Nose normal.  Mouth/Throat: Oropharynx is clear and moist. No oropharyngeal exudate.  Eyes: Conjunctivae and EOM are normal. Pupils are equal, round, and reactive to light.  Neck: No JVD present. No tracheal deviation present. No thyromegaly present.  Cardiovascular: Normal rate, regular rhythm, normal heart sounds and intact distal pulses.  Exam reveals no gallop and no friction rub.   No murmur heard. Pulmonary/Chest: No respiratory distress. He has no wheezes. He has no rales. He exhibits no tenderness.  Abdominal: He exhibits no distension and no mass. There is no tenderness.  Musculoskeletal: Normal range of motion. He exhibits no edema or tenderness.  Lymphadenopathy:    He has no cervical adenopathy.  Neurological: He is alert and oriented to person, place, and time. He has normal reflexes. No cranial nerve deficit. Coordination normal.  Skin: No rash noted. No erythema. No pallor.  Psychiatric: He has a normal mood and affect. His behavior is normal. Judgment and thought content normal.     Labs reviewed: No visits with results within 3 Month(s) from this visit. Latest known visit  with results is:  Appointment on 06/21/2013  Component Date Value Ref Range Status  . WBC 06/21/2013 9.7  3.4 - 10.8 x10E3/uL Final  . RBC 06/21/2013 5.56  4.14 - 5.80 x10E6/uL Final  . Hemoglobin 06/21/2013 14.9  12.6 - 17.7 g/dL Final  . HCT 64/40/3474 44.0  37.5 - 51.0 % Final  . MCV 06/21/2013 79  79 - 97 fL Final  . MCH 06/21/2013 26.8  26.6 - 33.0 pg Final  . MCHC 06/21/2013 33.9  31.5 - 35.7 g/dL Final  . RDW 25/95/6387 13.6  12.3 - 15.4 %  Final  . Neutrophils Relative % 06/21/2013 68   Final  . Lymphs 06/21/2013 26   Final  . Monocytes 06/21/2013 5   Final  . Eos 06/21/2013 1   Final  . Basos 06/21/2013 0   Final  . Neutrophils Absolute 06/21/2013 6.5  1.4 - 7.0 x10E3/uL Final  . Lymphocytes Absolute 06/21/2013 2.5  0.7 - 3.1 x10E3/uL Final  . Monocytes Absolute 06/21/2013 0.5  0.1 - 0.9 x10E3/uL Final  . Eosinophils Absolute 06/21/2013 0.1  0.0 - 0.4 x10E3/uL Final  . Basophils Absolute 06/21/2013 0.0  0.0 - 0.2 x10E3/uL Final  . Immature Granulocytes 06/21/2013 0   Final  . Immature Grans (Abs) 06/21/2013 0.0  0.0 - 0.1 x10E3/uL Final  . Glucose 06/21/2013 93  65 - 99 mg/dL Final  . BUN 56/43/3295 12  6 - 24 mg/dL Final  . Creatinine, Ser 06/21/2013 0.92  0.76 - 1.27 mg/dL Final  . GFR calc non Af Amer 06/21/2013 102  >59 mL/min/1.73 Final  . GFR calc Af Amer 06/21/2013 118  >59 mL/min/1.73 Final  . BUN/Creatinine Ratio 06/21/2013 13  9 - 20 Final  . Sodium 06/21/2013 139  134 - 144 mmol/L Final  . Potassium 06/21/2013 4.5  3.5 - 5.2 mmol/L Final  . Chloride 06/21/2013 100  97 - 108 mmol/L Final  . CO2 06/21/2013 25  18 - 29 mmol/L Final  . Calcium 06/21/2013 9.5  8.7 - 10.2 mg/dL Final  . Total Protein 06/21/2013 6.8  6.0 - 8.5 g/dL Final  . Albumin 18/84/1660 4.4  3.5 - 5.5 g/dL Final  . Globulin, Total 06/21/2013 2.4  1.5 - 4.5 g/dL Final  . Albumin/Globulin Ratio 06/21/2013 1.8  1.1 - 2.5 Final  . Total Bilirubin 06/21/2013 0.4  0.0 - 1.2 mg/dL Final  . Alkaline Phosphatase 06/21/2013 115  39 - 117 IU/L Final  . AST 06/21/2013 20  0 - 40 IU/L Final  . ALT 06/21/2013 21  0 - 44 IU/L Final  . PSA 06/21/2013 0.5  0.0 - 4.0 ng/mL Final   Comment: Roche ECLIA methodology.                          According to the American Urological Association, Serum PSA should                          decrease and remain at undetectable levels after radical                          prostatectomy. The AUA defines biochemical  recurrence as an initial                          PSA value 0.2 ng/mL or greater followed by a subsequent confirmatory  PSA value 0.2 ng/mL or greater.                          Values obtained with different assay methods or kits cannot be used                          interchangeably. Results cannot be interpreted as absolute evidence                          of the presence or absence of malignant disease.  . Vit D, 25-Hydroxy 06/21/2013 24.7* 30.0 - 100.0 ng/mL Final   Comment: Vitamin D deficiency has been defined by the Institute of                          Medicine and an Endocrine Society practice guideline as a                          level of serum 25-OH vitamin D less than 20 ng/mL (1,2).                          The Endocrine Society went on to further define vitamin D                          insufficiency as a level between 21 and 29 ng/mL (2).                          1. IOM (Institute of Medicine). 2010. Dietary reference                             intakes for calcium and D. [place]: The                             [employer].                          2. Holick MF, Binkley [place], Bischoff-Ferrari HA, et al.                             Evaluation, treatment, and prevention of vitamin D                             deficiency: an Endocrine Society clinical practice                             guideline. JCEM. 2011 Jul; 96(7):1911-30.     Assessment/Plan   1. Chronic pain syndrome Continue use oxycodone  2. DISC DISEASE, LUMBAR He does not want surgical referral at this time  3. Left low back pain, with sciatica presence unspecified - OxyCODONE HCl ER 30 MG T12A; One every 12 hours to relieve pain  Dispense: 60 each; Refill: 0  4. Hyperglycemia -CMP, A1c--future  5. Hyperlipidemia -Lipid panel, future  6. Sciatica, left Continue with pain medications  7. Anxiety state Given samples of Fetzima  8. Tobacco use  disorder  Counseled on smoking cessation  9. Depression - Levomilnacipran HCl ER (FETZIMA) 80 MG CP24; One daily to help anxiety and depression  Dispense: 30 capsule; Refill: 5

## 2014-05-06 ENCOUNTER — Other Ambulatory Visit: Payer: Self-pay | Admitting: *Deleted

## 2014-05-06 DIAGNOSIS — M545 Low back pain: Secondary | ICD-10-CM

## 2014-05-06 MED ORDER — OXYCODONE HCL 30 MG PO TABS: ORAL_TABLET | ORAL | Status: DC

## 2014-05-06 MED ORDER — OXYCODONE HCL ER 30 MG PO T12A: EXTENDED_RELEASE_TABLET | ORAL | Status: DC

## 2014-05-06 NOTE — Telephone Encounter (Signed)
Patient Requested and will pick up 

## 2014-06-03 ENCOUNTER — Ambulatory Visit (INDEPENDENT_AMBULATORY_CARE_PROVIDER_SITE_OTHER): Payer: BLUE CROSS/BLUE SHIELD | Admitting: Internal Medicine

## 2014-06-03 ENCOUNTER — Encounter: Payer: Self-pay | Admitting: Internal Medicine

## 2014-06-03 VITALS — BP 120/78 | HR 90 | Temp 97.8°F | Resp 18 | Ht 75.0 in | Wt 234.6 lb

## 2014-06-03 DIAGNOSIS — M5137 Other intervertebral disc degeneration, lumbosacral region: Secondary | ICD-10-CM | POA: Diagnosis not present

## 2014-06-03 DIAGNOSIS — F411 Generalized anxiety disorder: Secondary | ICD-10-CM | POA: Diagnosis not present

## 2014-06-03 DIAGNOSIS — E785 Hyperlipidemia, unspecified: Secondary | ICD-10-CM

## 2014-06-03 DIAGNOSIS — G894 Chronic pain syndrome: Secondary | ICD-10-CM

## 2014-06-03 DIAGNOSIS — F17200 Nicotine dependence, unspecified, uncomplicated: Secondary | ICD-10-CM

## 2014-06-03 DIAGNOSIS — M545 Low back pain: Secondary | ICD-10-CM

## 2014-06-03 DIAGNOSIS — Z720 Tobacco use: Secondary | ICD-10-CM

## 2014-06-03 DIAGNOSIS — R739 Hyperglycemia, unspecified: Secondary | ICD-10-CM

## 2014-06-03 MED ORDER — OXYCODONE HCL ER 30 MG PO T12A
EXTENDED_RELEASE_TABLET | ORAL | Status: DC
Start: 2014-06-03 — End: 2014-07-02

## 2014-06-03 MED ORDER — ALPRAZOLAM 0.5 MG PO TBDP: 0.5000 mg | ORAL_TABLET | Freq: Two times a day (BID) | ORAL | Status: DC | PRN

## 2014-06-03 MED ORDER — OXYCODONE HCL 30 MG PO TABS: ORAL_TABLET | ORAL | Status: DC

## 2014-06-03 NOTE — Progress Notes (Signed)
Patient ID: Taylor Kidd, male   DOB: 12-Oct-1970, 44 y.o.   MRN: [id number]    Facility  [facility]    Place of Service:   OFFICE    No Known Allergies  Chief Complaint  Patient presents with  . Medical Management of Chronic Issues    2 month follow-up, no recent labs   . Medication Refill    Discuss if Oxycodone rx's can be filled 3 days early    HPI:  Fetzima gave him confusion and weird dreams. Does not want to take anymore.  Left low back pain, with sciatica presence unspecified - benefits from OxyCODONE HCl ER 30 MG T12A  Chronic pain syndrome: Using oxycodone for control  DISC DISEASE, LUMBAR: Chronic pain. Using oxycodone for pain control.  Anxiety state: Continues to be separated from his wife although he states that they are "getting along better"  Hyperglycemia: Follow-up lab  Hyperlipidemia: Follow-up lab  Tobacco use disorder: Advised to stop smoking   Medications: Patient's Medications  New Prescriptions   No medications on file  Previous Medications   CHOLECALCIFEROL (VITAMIN D-3) 5000 UNITS TABS    Take 5,000 Units by mouth. Take 1 tablet on Mon, Wed, Fri for vitamin supplement.   LEVOMILNACIPRAN HCL ER (FETZIMA) 80 MG CP24    One daily to help anxiety and depression   OXYCODONE (ROXICODONE) 30 MG IMMEDIATE RELEASE TABLET    Take one tablet every 3 hours daily as needed for pain   OXYCODONE HCL ER 30 MG T12A    One every 12 hours to relieve pain  Modified Medications   No medications on file  Discontinued Medications   No medications on file     Review of Systems  Constitutional: Positive for fatigue.  HENT: Negative.   Eyes: Negative.   Respiratory: Negative.   Cardiovascular: Positive for palpitations (mild. No pains).  Gastrointestinal: Negative.   Endocrine: Negative.   Genitourinary: Negative.   Musculoskeletal: Positive for myalgias and back pain.  Skin: Negative.   Allergic/Immunologic: Negative.   Neurological: Negative.     Hematological: Negative.   Psychiatric/Behavioral: Positive for dysphoric mood.    Filed Vitals:   06/03/14 1609  BP: 120/78  Pulse: 90  Temp: 97.8 F (36.6 C)  TempSrc: Oral  Resp: 18  Height: 6\' 3"  (1.905 m)  Weight: 234 lb 9.6 oz (106.414 kg)  SpO2: 96%   Body mass index is 29.32 kg/(m^2).  Physical Exam  Constitutional: He is oriented to person, place, and time. He appears well-developed and well-nourished. No distress.  HENT:  Right Ear: External ear normal.  Left Ear: External ear normal.  Nose: Nose normal.  Mouth/Throat: Oropharynx is clear and moist. No oropharyngeal exudate.  Eyes: Conjunctivae and EOM are normal. Pupils are equal, round, and reactive to light.  Neck: No JVD present. No tracheal deviation present. No thyromegaly present.  Cardiovascular: Normal rate, regular rhythm, normal heart sounds and intact distal pulses.  Exam reveals no gallop and no friction rub.   No murmur heard. Pulmonary/Chest: No respiratory distress. He has no wheezes. He has no rales. He exhibits no tenderness.  Abdominal: He exhibits no distension and no mass. There is no tenderness.  Musculoskeletal: Normal range of motion. He exhibits no edema or tenderness.  Lymphadenopathy:    He has no cervical adenopathy.  Neurological: He is alert and oriented to person, place, and time. He has normal reflexes. No cranial nerve deficit. Coordination normal.  Skin: No rash noted. No erythema. No pallor.  Psychiatric: He has a normal mood and affect. His behavior is normal. Judgment and thought content normal.     Labs reviewed: No visits with results within 3 Month(s) from this visit. Latest known visit with results is:  Appointment on 06/21/2013  Component Date Value Ref Range Status  . WBC 06/21/2013 9.7  3.4 - 10.8 x10E3/uL Final  . RBC 06/21/2013 5.56  4.14 - 5.80 x10E6/uL Final  . Hemoglobin 06/21/2013 14.9  12.6 - 17.7 g/dL Final  . HCT 16/10/960405/02/2013 44.0  37.5 - 51.0 % Final  .  MCV 06/21/2013 79  79 - 97 fL Final  . MCH 06/21/2013 26.8  26.6 - 33.0 pg Final  . MCHC 06/21/2013 33.9  31.5 - 35.7 g/dL Final  . RDW 54/09/811905/02/2013 13.6  12.3 - 15.4 % Final  . Neutrophils Relative % 06/21/2013 68   Final  . Lymphs 06/21/2013 26   Final  . Monocytes 06/21/2013 5   Final  . Eos 06/21/2013 1   Final  . Basos 06/21/2013 0   Final  . Neutrophils Absolute 06/21/2013 6.5  1.4 - 7.0 x10E3/uL Final  . Lymphocytes Absolute 06/21/2013 2.5  0.7 - 3.1 x10E3/uL Final  . Monocytes Absolute 06/21/2013 0.5  0.1 - 0.9 x10E3/uL Final  . Eosinophils Absolute 06/21/2013 0.1  0.0 - 0.4 x10E3/uL Final  . Basophils Absolute 06/21/2013 0.0  0.0 - 0.2 x10E3/uL Final  . Immature Granulocytes 06/21/2013 0   Final  . Immature Grans (Abs) 06/21/2013 0.0  0.0 - 0.1 x10E3/uL Final  . Glucose 06/21/2013 93  65 - 99 mg/dL Final  . BUN 14/78/295605/02/2013 12  6 - 24 mg/dL Final  . Creatinine, Ser 06/21/2013 0.92  0.76 - 1.27 mg/dL Final  . GFR calc non Af Amer 06/21/2013 102  >59 mL/min/1.73 Final  . GFR calc Af Amer 06/21/2013 118  >59 mL/min/1.73 Final  . BUN/Creatinine Ratio 06/21/2013 13  9 - 20 Final  . Sodium 06/21/2013 139  134 - 144 mmol/L Final  . Potassium 06/21/2013 4.5  3.5 - 5.2 mmol/L Final  . Chloride 06/21/2013 100  97 - 108 mmol/L Final  . CO2 06/21/2013 25  18 - 29 mmol/L Final  . Calcium 06/21/2013 9.5  8.7 - 10.2 mg/dL Final  . Total Protein 06/21/2013 6.8  6.0 - 8.5 g/dL Final  . Albumin 21/30/865705/02/2013 4.4  3.5 - 5.5 g/dL Final  . Globulin, Total 06/21/2013 2.4  1.5 - 4.5 g/dL Final  . Albumin/Globulin Ratio 06/21/2013 1.8  1.1 - 2.5 Final  . Total Bilirubin 06/21/2013 0.4  0.0 - 1.2 mg/dL Final  . Alkaline Phosphatase 06/21/2013 115  39 - 117 IU/L Final  . AST 06/21/2013 20  0 - 40 IU/L Final  . ALT 06/21/2013 21  0 - 44 IU/L Final  . PSA 06/21/2013 0.5  0.0 - 4.0 ng/mL Final   Comment: Roche ECLIA methodology.                          According to the American Urological Association,  Serum PSA should                          decrease and remain at undetectable levels after radical                          prostatectomy. The AUA defines biochemical recurrence as an initial  PSA value 0.2 ng/mL or greater followed by a subsequent confirmatory                          PSA value 0.2 ng/mL or greater.                          Values obtained with different assay methods or kits cannot be used                          interchangeably. Results cannot be interpreted as absolute evidence                          of the presence or absence of malignant disease.  . Vit D, 25-Hydroxy 06/21/2013 24.7* 30.0 - 100.0 ng/mL Final   Comment: Vitamin D deficiency has been defined by the Institute of                          Medicine and an Endocrine Society practice guideline as a                          level of serum 25-OH vitamin D less than 20 ng/mL (1,2).                          The Endocrine Society went on to further define vitamin D                          insufficiency as a level between 21 and 29 ng/mL (2).                          1. IOM (Institute of Medicine). 2010. Dietary reference                             intakes for calcium and D. [place]: The                             [employer].                          2. Holick MF, Binkley [place], Bischoff-Ferrari HA, et al.                             Evaluation, treatment, and prevention of vitamin D                             deficiency: an Endocrine Society clinical practice                             guideline. JCEM. 2011 Jul; 96(7):1911-30.     Assessment/Plan  1. Left low back pain, with sciatica presence unspecified - OxyCODONE HCl ER 30 MG T12A; One every 12 hours to relieve pain  Dispense: 60 each; Refill: 0  2. Chronic pain syndrome - oxycodone (ROXICODONE) 30 MG immediate release tablet; Take one tablet every 3 hours daily  as needed for pain  Dispense: 240 tablet;  Refill: 0 - OxyCODONE HCl ER 30 MG T12A; One every 12 hours to relieve pain  Dispense: 60 each; Refill: 0  3. DISC DISEASE, LUMBAR Refill oxycodone  4. Anxiety state Discontinued Fetzima - ALPRAZolam (NIRAVAM) 0.5 MG dissolvable tablet; Take 1 tablet (0.5 mg total) by mouth 2 (two) times daily as needed for anxiety.  Dispense: 60 tablet; Refill: 3  5. Hyperglycemia -CMP  6. Hyperlipidemia -Lipid panel  7. Tobacco use disorder Advised to stop smoking

## 2014-06-03 NOTE — Patient Instructions (Signed)
Return for lab work prior to the next visit.

## 2014-07-02 ENCOUNTER — Other Ambulatory Visit: Payer: Self-pay | Admitting: *Deleted

## 2014-07-02 DIAGNOSIS — M545 Low back pain: Secondary | ICD-10-CM

## 2014-07-02 DIAGNOSIS — G894 Chronic pain syndrome: Secondary | ICD-10-CM

## 2014-07-02 MED ORDER — VITAMIN D3 125 MCG (5000 UT) PO CAPS
ORAL_CAPSULE | ORAL | Status: DC
Start: 2014-07-02 — End: 2014-12-03

## 2014-07-02 MED ORDER — OXYCODONE HCL 30 MG PO TABS: ORAL_TABLET | ORAL | Status: DC

## 2014-07-02 MED ORDER — OXYCODONE HCL ER 30 MG PO T12A: EXTENDED_RELEASE_TABLET | ORAL | Status: DC

## 2014-07-02 NOTE — Telephone Encounter (Signed)
Patient Requested and will pick up 

## 2014-08-01 ENCOUNTER — Other Ambulatory Visit: Payer: Self-pay

## 2014-08-01 DIAGNOSIS — G894 Chronic pain syndrome: Secondary | ICD-10-CM

## 2014-08-01 DIAGNOSIS — M545 Low back pain: Secondary | ICD-10-CM

## 2014-08-01 MED ORDER — OXYCODONE HCL ER 30 MG PO T12A: EXTENDED_RELEASE_TABLET | ORAL | Status: DC

## 2014-08-01 MED ORDER — OXYCODONE HCL 30 MG PO TABS: ORAL_TABLET | ORAL | Status: DC

## 2014-08-01 NOTE — Telephone Encounter (Signed)
Patient called for refill on both Oxycodone last refill 07/02/14

## 2014-08-01 NOTE — Telephone Encounter (Signed)
Re-printed Oxycodone HCI ER 30 mg because didn't print on blue paper

## 2014-08-20 ENCOUNTER — Telehealth: Payer: Self-pay | Admitting: *Deleted

## 2014-08-20 NOTE — Telephone Encounter (Signed)
Patient called and wants to know if you will prescribe him a Rx for Chantix to help him stop smoking or whatever you prefer. Also wants to know if he can pick up his Pain Medication early due to having to pick up his mothers also.  Please Advise.

## 2014-08-21 MED ORDER — VARENICLINE TARTRATE 0.5 MG PO TABS
0.5000 mg | ORAL_TABLET | Freq: Two times a day (BID) | ORAL | Status: DC
Start: 2014-08-21 — End: 2015-06-30

## 2014-08-21 NOTE — Telephone Encounter (Signed)
Per Dr. Chilton SiGreen- ok to fax Rx for Chantix to patient's pharmacy.  Patient notified and Rx faxed.

## 2014-08-29 ENCOUNTER — Other Ambulatory Visit: Payer: Self-pay | Admitting: *Deleted

## 2014-08-29 DIAGNOSIS — G894 Chronic pain syndrome: Secondary | ICD-10-CM

## 2014-08-29 DIAGNOSIS — M545 Low back pain: Secondary | ICD-10-CM

## 2014-08-29 MED ORDER — OXYCODONE HCL ER 30 MG PO T12A: EXTENDED_RELEASE_TABLET | ORAL | Status: DC

## 2014-08-29 MED ORDER — OXYCODONE HCL 30 MG PO TABS: ORAL_TABLET | ORAL | Status: DC

## 2014-09-02 ENCOUNTER — Telehealth: Payer: Self-pay | Admitting: *Deleted

## 2014-09-02 ENCOUNTER — Encounter: Payer: Self-pay | Admitting: Nurse Practitioner

## 2014-09-02 ENCOUNTER — Ambulatory Visit (INDEPENDENT_AMBULATORY_CARE_PROVIDER_SITE_OTHER): Payer: BLUE CROSS/BLUE SHIELD | Admitting: Nurse Practitioner

## 2014-09-02 VITALS — BP 128/82 | HR 89 | Temp 98.1°F | Resp 18 | Ht 75.0 in | Wt 248.0 lb

## 2014-09-02 DIAGNOSIS — J209 Acute bronchitis, unspecified: Secondary | ICD-10-CM

## 2014-09-02 MED ORDER — DOXYCYCLINE HYCLATE 100 MG PO TABS: 100.0000 mg | ORAL_TABLET | Freq: Two times a day (BID) | ORAL | Status: DC

## 2014-09-02 NOTE — Progress Notes (Signed)
Patient ID: Taylor Kidd, male   DOB: May 23, 1970, 44 y.o.   MRN: [id number]    PCP: Taylor Relic, MD  No Known Allergies  Chief Complaint  Patient presents with  . Acute Visit    congestion and cough     HPI: Patient is a 44 y.o. male seen in the office today for cough and congestion for 5 days.  Reports congestion in chest, feels it rattling. Stuffy nose, not runny. Headache at times.  Granddaughter brought home something from daycare.  Does not have a cough all the times but does at times.  No fevers or chills No shortness of breath.  Taken dayquil and drinking orange juice.   Review of Systems:  Review of Systems  Constitutional: Positive for fatigue. Negative for fever, chills, activity change and appetite change.  HENT: Positive for congestion and postnasal drip. Negative for ear pain, facial swelling, hearing loss, nosebleeds, rhinorrhea, sinus pressure, sore throat and trouble swallowing.   Respiratory: Negative for shortness of breath and wheezing.   Cardiovascular: Negative for chest pain and palpitations.  Neurological: Positive for headaches.    Past Medical History  Diagnosis Date  . Unspecified vitamin D deficiency   . Other abnormal blood chemistry   . Unspecified essential hypertension   . Hematuria, unspecified   . Lumbago   . Other malaise and fatigue   . Palpitations   . Heartburn    History reviewed. No pertinent past surgical history. Social History:   reports that he has been smoking Cigarettes.  He has been smoking about 0.50 packs per day. He has never used smokeless tobacco. He reports that he drinks alcohol. He reports that he does not use illicit drugs.  History reviewed. No pertinent family history.  Medications: Patient's Medications  New Prescriptions   No medications on file  Previous Medications   ALPRAZOLAM (NIRAVAM) 0.5 MG DISSOLVABLE TABLET    Take 1 tablet (0.5 mg total) by mouth 2 (two) times daily as needed for anxiety.   CHOLECALCIFEROL (VITAMIN D3) 5000 UNITS CAPS    Take one tablet by mouth Monday, Wednesday and Friday for Vitamin D Supplement.   OXYCODONE (ROXICODONE) 30 MG IMMEDIATE RELEASE TABLET    Take one tablet every 3 hours daily as needed for pain   OXYCODONE HCL ER 30 MG T12A    One every 12 hours to relieve pain   VARENICLINE (CHANTIX) 0.5 MG TABLET    Take 1 tablet (0.5 mg total) by mouth 2 (two) times daily. For smoking cessation  Modified Medications   No medications on file  Discontinued Medications   CHOLECALCIFEROL (VITAMIN D-3) 5000 UNITS TABS    Take 5,000 Units by mouth. Take 1 tablet on Mon, Wed, Fri for vitamin supplement.     Physical Exam:  Filed Vitals:   09/02/14 1508  BP: 128/82  Pulse: 89  Temp: 98.1 F (36.7 C)  TempSrc: Oral  Resp: 18  Height:  (1.905 m)  Weight: 248 lb (112.492 kg)  SpO2: 96%    Physical Exam  Constitutional: He appears well-developed and well-nourished. No distress.  HENT:  Head: Normocephalic and atraumatic.  Right Ear: External ear normal.  Left Ear: External ear normal.  Nose: Nose normal.  Mouth/Throat: Oropharynx is clear and moist. No oropharyngeal exudate.  Eyes: Conjunctivae and EOM are normal. Pupils are equal, round, and reactive to light.  Neck: Normal range of motion. Neck supple.  Cardiovascular: Normal rate, regular rhythm and normal heart sounds.  Pulmonary/Chest: Effort normal.  Decreased breath sounds throughout   Neurological: He is alert.  Skin: Skin is warm and dry. He is not diaphoretic.  Psychiatric: He has a normal mood and affect.    Labs reviewed: Basic Metabolic Panel: No results for input(s): NA, K, CL, CO2, GLUCOSE, BUN, CREATININE, CALCIUM, MG, PHOS, TSH in the last 8760 hours. Liver Function Tests: No results for input(s): AST, ALT, ALKPHOS, BILITOT, PROT, ALBUMIN in the last 8760 hours. No results for input(s): LIPASE, AMYLASE in the last 8760 hours. No results for input(s): AMMONIA in the last  8760 hours. CBC: No results for input(s): WBC, NEUTROABS, HGB, HCT, MCV, PLT in the last 8760 hours. Lipid Panel: No results for input(s): CHOL, HDL, LDLCALC, TRIG, CHOLHDL, LDLDIRECT in the last 8760 hours. TSH: No results for input(s): TSH in the last 8760 hours. A1C: No results found for: HGBA1C   Assessment/Plan  1. Acute bronchitis, unspecified organism -supportive care, most likely viral due to sick contact -to take mucinex DM twice daily for next 5 days with full glass of water -if persist after 1 week or gets worse may take doxycyline  - doxycycline (VIBRA-TABS) 100 MG tablet; Take 1 tablet (100 mg total) by mouth 2 (two) times daily.  Dispense: 14 tablet; Refill: 0 - follow up/seek medical attention if symptoms persist or get worse despite treatment   Taylor Kidd, AGNP  [facility] [phone](Monday-Friday 8 am - 5 pm) [phone] (after hours)

## 2014-09-02 NOTE — Patient Instructions (Signed)
Mucinex DM by mouth twice daily for next 5 days then as needed Increase water intake    Acute Bronchitis Bronchitis is inflammation of the airways that extend from the windpipe into the lungs (bronchi). The inflammation often causes mucus to develop. This leads to a cough, which is the most common symptom of bronchitis.  In acute bronchitis, the condition usually develops suddenly and goes away over time, usually in a couple weeks. Smoking, allergies, and asthma can make bronchitis worse. Repeated episodes of bronchitis may cause further lung problems.  CAUSES Acute bronchitis is most often caused by the same virus that causes a cold. The virus can spread from person to person (contagious) through coughing, sneezing, and touching contaminated objects. SIGNS AND SYMPTOMS   Cough.   Fever.   Coughing up mucus.   Body aches.   Chest congestion.   Chills.   Shortness of breath.   Sore throat.  DIAGNOSIS  Acute bronchitis is usually diagnosed through a physical exam. Your health care provider will also ask you questions about your medical history. Tests, such as chest X-rays, are sometimes done to rule out other conditions.  TREATMENT  Acute bronchitis usually goes away in a couple weeks. Oftentimes, no medical treatment is necessary. Medicines are sometimes given for relief of fever or cough. Antibiotic medicines are usually not needed but may be prescribed in certain situations. In some cases, an inhaler may be recommended to help reduce shortness of breath and control the cough. A cool mist vaporizer may also be used to help thin bronchial secretions and make it easier to clear the chest.  HOME CARE INSTRUCTIONS  Get plenty of rest.   Drink enough fluids to keep your urine clear or pale yellow (unless you have a medical condition that requires fluid restriction). Increasing fluids may help thin your respiratory secretions (sputum) and reduce chest congestion, and it will  prevent dehydration.   Take medicines only as directed by your health care provider.  If you were prescribed an antibiotic medicine, finish it all even if you start to feel better.  Avoid smoking and secondhand smoke. Exposure to cigarette smoke or irritating chemicals will make bronchitis worse. If you are a smoker, consider using nicotine gum or skin patches to help control withdrawal symptoms. Quitting smoking will help your lungs heal faster.   Reduce the chances of another bout of acute bronchitis by washing your hands frequently, avoiding people with cold symptoms, and trying not to touch your hands to your mouth, nose, or eyes.   Keep all follow-up visits as directed by your health care provider.  SEEK MEDICAL CARE IF: Your symptoms do not improve after 1 week of treatment.  SEEK IMMEDIATE MEDICAL CARE IF:  You develop an increased fever or chills.   You have chest pain.   You have severe shortness of breath.  You have bloody sputum.   You develop dehydration.  You faint or repeatedly feel like you are going to pass out.  You develop repeated vomiting.  You develop a severe headache. MAKE SURE YOU:   Understand these instructions.  Will watch your condition.  Will get help right away if you are not doing well or get worse. Document Released: 03/17/2004 Document Revised: 06/24/2013 Document Reviewed: 07/31/2012 [facility] Patient Information 2015 [employer] [place]. This information is not intended to replace advice given to you by your health care provider. Make sure you discuss any questions you have with your health care provider.

## 2014-09-02 NOTE — Telephone Encounter (Signed)
Patient called and left message and requested something be called in for Congestion and Head Cold. Called patient and left message that he would need an appointment to be evaluated for antibiotic.

## 2014-09-24 ENCOUNTER — Telehealth: Payer: Self-pay | Admitting: *Deleted

## 2014-09-24 NOTE — Telephone Encounter (Signed)
Called regarding his pain medications wants to pick them up on Friday. I return his call and informed him it will be ready after 9:00am for pick- up.

## 2014-09-26 ENCOUNTER — Other Ambulatory Visit: Payer: Self-pay | Admitting: *Deleted

## 2014-09-26 DIAGNOSIS — M545 Low back pain: Secondary | ICD-10-CM

## 2014-09-26 DIAGNOSIS — G894 Chronic pain syndrome: Secondary | ICD-10-CM

## 2014-09-26 MED ORDER — OXYCODONE HCL ER 30 MG PO T12A: EXTENDED_RELEASE_TABLET | ORAL | Status: DC

## 2014-09-26 MED ORDER — OXYCODONE HCL 30 MG PO TABS: ORAL_TABLET | ORAL | Status: DC

## 2014-09-26 MED ORDER — ALPRAZOLAM 0.5 MG PO TABS: ORAL_TABLET | ORAL | Status: DC

## 2014-09-26 NOTE — Telephone Encounter (Signed)
Patient requested and will pick up 

## 2014-10-10 ENCOUNTER — Telehealth: Payer: Self-pay

## 2014-10-10 NOTE — Telephone Encounter (Signed)
Schedule him for 11 AM next Wednesday, 10/15/2014.

## 2014-10-10 NOTE — Telephone Encounter (Signed)
Patient with constant left knee pain x 2 weeks, no known injury. Patient requesting appointment for next week.  Patient unable to be seen on Monday with Dr. Renato Gails. Patient requested to be seen on Tuesday or Wednesday. Please advise if patient can be worked on Tuesday or Wednesday.

## 2014-10-10 NOTE — Telephone Encounter (Signed)
Discussed with patient, scheduled appointment for next Wednesday at 11:00 am

## 2014-10-14 ENCOUNTER — Other Ambulatory Visit: Payer: BLUE CROSS/BLUE SHIELD

## 2014-10-14 DIAGNOSIS — R739 Hyperglycemia, unspecified: Secondary | ICD-10-CM

## 2014-10-14 DIAGNOSIS — E785 Hyperlipidemia, unspecified: Secondary | ICD-10-CM

## 2014-10-15 ENCOUNTER — Ambulatory Visit: Payer: BLUE CROSS/BLUE SHIELD | Admitting: Internal Medicine

## 2014-10-15 LAB — LIPID PANEL
CHOLESTEROL TOTAL: 168 mg/dL (ref 100–199)
Chol/HDL Ratio: 3.2 ratio units (ref 0.0–5.0)
HDL: 52 mg/dL (ref >39)
LDL CALC: 87 mg/dL (ref 0–99)
Triglycerides: 143 mg/dL (ref 0–149)
VLDL CHOLESTEROL CAL: 29 mg/dL (ref 5–40)

## 2014-10-15 LAB — COMPREHENSIVE METABOLIC PANEL
ALK PHOS: 88 IU/L (ref 39–117)
ALT: 21 IU/L (ref 0–44)
AST: 20 IU/L (ref 0–40)
Albumin/Globulin Ratio: 1.7 (ref 1.1–2.5)
Albumin: 4.3 g/dL (ref 3.5–5.5)
BUN/Creatinine Ratio: 23 — ABNORMAL HIGH (ref 9–20)
BUN: 16 mg/dL (ref 6–24)
Bilirubin Total: 0.3 mg/dL (ref 0.0–1.2)
CO2: 27 mmol/L (ref 18–29)
CREATININE: 0.69 mg/dL — AB (ref 0.76–1.27)
Calcium: 9.7 mg/dL (ref 8.7–10.2)
Chloride: 97 mmol/L (ref 97–108)
GFR calc Af Amer: 133 mL/min/{1.73_m2} (ref >59)
GFR calc non Af Amer: 115 mL/min/{1.73_m2} (ref >59)
GLUCOSE: 100 mg/dL — AB (ref 65–99)
Globulin, Total: 2.6 g/dL (ref 1.5–4.5)
Potassium: 4.3 mmol/L (ref 3.5–5.2)
Sodium: 139 mmol/L (ref 134–144)
Total Protein: 6.9 g/dL (ref 6.0–8.5)

## 2014-10-15 LAB — HEMOGLOBIN A1C
Est. average glucose Bld gHb Est-mCnc: 114 mg/dL
HEMOGLOBIN A1C: 5.6 % (ref 4.8–5.6)

## 2014-10-16 LAB — PSA: Prostate Specific Ag, Serum: 0.5 ng/mL (ref 0.0–4.0)

## 2014-10-16 LAB — SPECIMEN STATUS REPORT

## 2014-10-17 ENCOUNTER — Encounter: Payer: Self-pay | Admitting: Internal Medicine

## 2014-10-24 ENCOUNTER — Other Ambulatory Visit: Payer: Self-pay

## 2014-10-24 DIAGNOSIS — M545 Low back pain: Secondary | ICD-10-CM

## 2014-10-24 DIAGNOSIS — G894 Chronic pain syndrome: Secondary | ICD-10-CM

## 2014-10-24 MED ORDER — OXYCODONE HCL 30 MG PO TABS: ORAL_TABLET | ORAL | Status: DC

## 2014-10-24 MED ORDER — OXYCODONE HCL ER 30 MG PO T12A: EXTENDED_RELEASE_TABLET | ORAL | Status: DC

## 2014-10-24 NOTE — Telephone Encounter (Signed)
Refill request

## 2014-11-21 ENCOUNTER — Other Ambulatory Visit: Payer: Self-pay | Admitting: *Deleted

## 2014-11-21 DIAGNOSIS — G894 Chronic pain syndrome: Secondary | ICD-10-CM

## 2014-11-21 DIAGNOSIS — M545 Low back pain: Secondary | ICD-10-CM

## 2014-11-21 MED ORDER — OXYCODONE HCL ER 30 MG PO T12A: EXTENDED_RELEASE_TABLET | ORAL | Status: DC

## 2014-11-21 MED ORDER — OXYCODONE HCL 30 MG PO TABS: ORAL_TABLET | ORAL | Status: DC

## 2014-11-21 NOTE — Telephone Encounter (Signed)
Patient requested and Taylor Kidd will pick up

## 2014-12-03 ENCOUNTER — Encounter: Payer: Self-pay | Admitting: Internal Medicine

## 2014-12-03 ENCOUNTER — Ambulatory Visit (INDEPENDENT_AMBULATORY_CARE_PROVIDER_SITE_OTHER): Payer: BLUE CROSS/BLUE SHIELD | Admitting: Internal Medicine

## 2014-12-03 VITALS — BP 118/82 | HR 74 | Temp 98.0°F | Resp 18 | Ht 75.0 in | Wt 245.4 lb

## 2014-12-03 DIAGNOSIS — E291 Testicular hypofunction: Secondary | ICD-10-CM

## 2014-12-03 DIAGNOSIS — G894 Chronic pain syndrome: Secondary | ICD-10-CM

## 2014-12-03 DIAGNOSIS — M5432 Sciatica, left side: Secondary | ICD-10-CM

## 2014-12-03 DIAGNOSIS — E785 Hyperlipidemia, unspecified: Secondary | ICD-10-CM | POA: Diagnosis not present

## 2014-12-03 DIAGNOSIS — E559 Vitamin D deficiency, unspecified: Secondary | ICD-10-CM

## 2014-12-03 DIAGNOSIS — F17200 Nicotine dependence, unspecified, uncomplicated: Secondary | ICD-10-CM

## 2014-12-03 DIAGNOSIS — R739 Hyperglycemia, unspecified: Secondary | ICD-10-CM

## 2014-12-03 DIAGNOSIS — R7989 Other specified abnormal findings of blood chemistry: Secondary | ICD-10-CM

## 2014-12-03 MED ORDER — VITAMIN D3 125 MCG (5000 UT) PO CAPS: ORAL_CAPSULE | ORAL | Status: DC

## 2014-12-03 NOTE — Progress Notes (Signed)
Patient ID: Taylor Kidd, male   DOB: 10-03-1970, 44 y.o.   MRN: [id number]    Facility  [facility] [facility]    Place of Service:   OFFICE    No Known Allergies  Chief Complaint  Patient presents with  . Medical Management of Chronic Issues    follow-up    HPI:  Sciatica of left side - patient had some pain in the left knee in August. He says the pain actually started in his back and radiated through the hip down to the left knee. It is resolved this time. Patient has a previous history of sciatica was also on this left side. He has known disc disease in lower back. He says he may want to see a specialist next year.  Hyperglycemia - very mild elevations in glucose in the past. Most recently normal.  Hyperlipidemia - controlled  Chronic pain syndrome - related to chronic back problems  Tobacco use disorder - continues to smoke. Using Chantix to try to quit.  Low testosterone - prior history of low testosterone for which he uses AndroGel. He did not like the gel form and asked if he could start on oral supplement. I recommended that he get a repeat testosterone level done first.  Vitamin D deficiency - continues to take 5000 units vitamin D supplement 3 days weekly.    Medications: Patient's Medications  New Prescriptions   No medications on file  Previous Medications   ALPRAZOLAM (XANAX) 0.5 MG TABLET    Take one tablet by mouth twice daily as needed for anxiety   CHOLECALCIFEROL (VITAMIN D3) 5000 UNITS CAPS    Take one tablet by mouth Monday, Wednesday and Friday for Vitamin D Supplement.   DOXYCYCLINE (VIBRA-TABS) 100 MG TABLET    Take 1 tablet (100 mg total) by mouth 2 (two) times daily.   OXYCODONE (ROXICODONE) 30 MG IMMEDIATE RELEASE TABLET    Take one tablet every 3 hours daily as needed for pain   OXYCODONE HCL ER 30 MG T12A    One every 12 hours to relieve pain   VARENICLINE (CHANTIX) 0.5 MG TABLET    Take 1 tablet (0.5 mg total) by mouth 2 (two) times daily. For smoking cessation    Modified Medications   No medications on file  Discontinued Medications   No medications on file    Review of Systems  Constitutional: Positive for fatigue.  HENT: Negative.   Eyes: Negative.   Respiratory: Negative.   Cardiovascular: Positive for palpitations (mild. No pains).  Gastrointestinal: Negative.   Endocrine: Negative.   Genitourinary: Negative.   Musculoskeletal: Positive for myalgias and back pain (DDD of low back).  Skin: Negative.   Allergic/Immunologic: Negative.   Neurological: Negative.   Hematological: Negative.   Psychiatric/Behavioral: Positive for dysphoric mood.    Filed Vitals:   12/03/14 1133  BP: 118/82  Pulse: 74  Temp: 98 F (36.7 C)  TempSrc: Oral  Resp: 18  Height: _0  (1.905 m)  Weight: 245 lb 6.4 oz (111.313 kg)  SpO2: 96%   Body mass index is 30.67 kg/(m^2).  Physical Exam  Constitutional: He is oriented to person, place, and time. He appears well-developed and well-nourished. No distress.  HENT:  Right Ear: External ear normal.  Left Ear: External ear normal.  Nose: Nose normal.  Mouth/Throat: Oropharynx is clear and moist. No oropharyngeal exudate.  Eyes: Conjunctivae and EOM are normal. Pupils are equal, round, and reactive to light.  Neck: No JVD present. No tracheal deviation  present. No thyromegaly present.  Cardiovascular: Normal rate, regular rhythm, normal heart sounds and intact distal pulses.  Exam reveals no gallop and no friction rub.   No murmur heard. Pulmonary/Chest: No respiratory distress. He has no wheezes. He has no rales. He exhibits no tenderness.  Abdominal: He exhibits no distension and no mass. There is no tenderness.  Musculoskeletal: Normal range of motion. He exhibits no edema or tenderness.  Lymphadenopathy:    He has no cervical adenopathy.  Neurological: He is alert and oriented to person, place, and time. He has normal reflexes. No cranial nerve deficit. Coordination normal.  Skin: No rash noted.  No erythema. No pallor.  Psychiatric: He has a normal mood and affect. His behavior is normal. Judgment and thought content normal.    Labs reviewed: Lab Summary Latest Ref Rng 10/14/2014 06/21/2013  Hemoglobin 12.6 - 17.7 g/dL (None) 14.9  Hematocrit 37.5 - 51.0 % (None) 44.0  White count 3.4 - 10.8 x10E3/uL (None) 9.7  Platelet count - (None) (None)  Sodium 134 - 144 mmol/L 139 139  Potassium 3.5 - 5.2 mmol/L 4.3 4.5  Calcium 8.7 - 10.2 mg/dL 9.7 9.5  Phosphorus - (None) (None)  Creatinine 0.76 - 1.27 mg/dL 0.69(L) 0.92  AST 0 - 40 IU/L 20 20  Alk Phos 39 - 117 IU/L 88 115  Bilirubin 0.0 - 1.2 mg/dL 0.3 0.4  Glucose 65 - 99 mg/dL 100(H) 93  Cholesterol - (None) (None)  HDL cholesterol >39 mg/dL 52 (None)  Triglycerides 0 - 149 mg/dL 143 (None)  LDL Direct - (None) (None)  LDL Calc 0 - 99 mg/dL 87 (None)  Total protein - (None) (None)  Albumin 3.5 - 5.5 g/dL 4.3 4.4   Lab Results  Component Value Date   TSH 0.875 05/28/2012   Lab Results  Component Value Date   BUN 16 10/14/2014   Lab Results  Component Value Date   HGBA1C 5.6 10/14/2014    Assessment/Plan  1. Sciatica of left side Continue current pain medication  2. Hyperglycemia - Basic metabolic panel; Future  3. Hyperlipidemia Lipid values were normal this time. Repeat in one year.  4. Chronic pain syndrome Continue current pain medication  5. Tobacco use disorder Advised to stop smoking. Continue Chantix.  6. Low testosterone - Testosterone  7. Vitamin D deficiency - Cholecalciferol (VITAMIN D3) 5000 UNITS CAPS; Take one tablet by mouth Monday, Wednesday and Friday for Vitamin D Supplement.  Dispense: 30 capsule; Refill: 4

## 2014-12-04 ENCOUNTER — Other Ambulatory Visit: Payer: Self-pay | Admitting: Internal Medicine

## 2014-12-04 ENCOUNTER — Telehealth: Payer: Self-pay

## 2014-12-04 DIAGNOSIS — R7989 Other specified abnormal findings of blood chemistry: Secondary | ICD-10-CM

## 2014-12-04 LAB — TESTOSTERONE: Testosterone: 130 ng/dL — ABNORMAL LOW (ref 348–1197)

## 2014-12-04 MED ORDER — METHYLTESTOSTERONE 10 MG PO TABS: ORAL_TABLET | ORAL | Status: DC

## 2014-12-04 NOTE — Telephone Encounter (Signed)
-----   Message from Kimber RelicArthur G Green, MD sent at 12/04/2014 11:29 AM EDT ----- Tell patient that we did confirm a low testosterone level again. We will need to fax a prescription for methyl testosterone 10 mg 3 times daily. I added this to his drug list.

## 2014-12-04 NOTE — Telephone Encounter (Signed)
Discussed with patient, patient verbalized understanding of results. RX printed [facility]

## 2014-12-08 ENCOUNTER — Telehealth: Payer: Self-pay | Admitting: *Deleted

## 2014-12-08 MED ORDER — TESTOSTERONE 30 MG/ACT TD SOLN: TRANSDERMAL | Status: DC

## 2014-12-08 NOTE — Telephone Encounter (Signed)
Faxed Rx to pharmacy and patient notified. 

## 2014-12-08 NOTE — Telephone Encounter (Signed)
Rx: Axiron  Disp: 90 cc Sig: Apply one pump actuation under arm daily Refill: 5  Advise patient there may be a money saving coupon on the internet.

## 2014-12-08 NOTE — Telephone Encounter (Signed)
Patient called and stated that the Testosterone is too much and needs an alternative, he stated it was $6400.84. Patient stated that he is ok going back on Andro Gel. Please Advise.

## 2014-12-09 ENCOUNTER — Other Ambulatory Visit: Payer: Self-pay | Admitting: *Deleted

## 2014-12-09 MED ORDER — TESTOSTERONE 30 MG/ACT TD SOLN: TRANSDERMAL | Status: DC

## 2014-12-09 NOTE — Telephone Encounter (Signed)
Pharmacy did not receive Rx. Phoned to pharmacy.

## 2014-12-17 ENCOUNTER — Telehealth: Payer: Self-pay

## 2014-12-17 ENCOUNTER — Other Ambulatory Visit: Payer: Self-pay | Admitting: Internal Medicine

## 2014-12-17 DIAGNOSIS — M545 Low back pain: Secondary | ICD-10-CM

## 2014-12-17 DIAGNOSIS — G894 Chronic pain syndrome: Secondary | ICD-10-CM

## 2014-12-17 MED ORDER — OXYCODONE HCL 30 MG PO TABS: ORAL_TABLET | ORAL | Status: DC

## 2014-12-17 MED ORDER — OXYCODONE HCL ER 30 MG PO T12A: EXTENDED_RELEASE_TABLET | ORAL | Status: DC

## 2014-12-17 NOTE — Telephone Encounter (Signed)
Patient called requesting that rx's for Oxycodone be released tomorrow (last filled 11/21/14). 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 Patient states if Dr.Green does not approve for early release he will need to get his friend Benetta SparLee Rumbley to pick-up rx's on Friday.  Patient did not sign Controlled Substance Contract at last refill and will need to when these rx's are picked up  Dr.Green please advise

## 2014-12-17 NOTE — Telephone Encounter (Signed)
Prescriptions were printed and signed today

## 2014-12-17 NOTE — Telephone Encounter (Signed)
Patient aware rx's available and contract to be signed

## 2015-01-14 ENCOUNTER — Other Ambulatory Visit: Payer: Self-pay

## 2015-01-14 DIAGNOSIS — G894 Chronic pain syndrome: Secondary | ICD-10-CM

## 2015-01-14 DIAGNOSIS — M545 Low back pain: Secondary | ICD-10-CM

## 2015-01-14 MED ORDER — OXYCODONE HCL ER 30 MG PO T12A: EXTENDED_RELEASE_TABLET | ORAL | Status: DC

## 2015-01-14 MED ORDER — OXYCODONE HCL 30 MG PO TABS
ORAL_TABLET | ORAL | Status: DC
Start: 2015-01-14 — End: 2015-02-13

## 2015-02-09 ENCOUNTER — Other Ambulatory Visit: Payer: Self-pay | Admitting: Internal Medicine

## 2015-02-13 ENCOUNTER — Other Ambulatory Visit: Payer: Self-pay | Admitting: *Deleted

## 2015-02-13 DIAGNOSIS — M545 Low back pain: Secondary | ICD-10-CM

## 2015-02-13 DIAGNOSIS — G894 Chronic pain syndrome: Secondary | ICD-10-CM

## 2015-02-13 MED ORDER — OXYCODONE HCL ER 30 MG PO T12A: EXTENDED_RELEASE_TABLET | ORAL | Status: DC

## 2015-02-13 MED ORDER — OXYCODONE HCL 30 MG PO TABS: ORAL_TABLET | ORAL | Status: DC

## 2015-02-13 NOTE — Telephone Encounter (Signed)
Patient requested and will pick up 

## 2015-03-12 ENCOUNTER — Encounter: Payer: Self-pay | Admitting: Nurse Practitioner

## 2015-03-12 ENCOUNTER — Ambulatory Visit (INDEPENDENT_AMBULATORY_CARE_PROVIDER_SITE_OTHER): Payer: BLUE CROSS/BLUE SHIELD | Admitting: Nurse Practitioner

## 2015-03-12 VITALS — BP 130/80 | HR 93 | Temp 98.6°F | Resp 20 | Ht 75.0 in | Wt 258.0 lb

## 2015-03-12 DIAGNOSIS — R7989 Other specified abnormal findings of blood chemistry: Secondary | ICD-10-CM

## 2015-03-12 DIAGNOSIS — M25511 Pain in right shoulder: Secondary | ICD-10-CM

## 2015-03-12 DIAGNOSIS — E291 Testicular hypofunction: Secondary | ICD-10-CM

## 2015-03-12 DIAGNOSIS — E559 Vitamin D deficiency, unspecified: Secondary | ICD-10-CM

## 2015-03-12 NOTE — Progress Notes (Signed)
Patient ID: Taylor Kidd, male   DOB: 1970-03-12, 45 y.o.   MRN: [id number]    PCP: Kimber Relic, MD  No Known Allergies  Chief Complaint  Patient presents with  . Medical Management of Chronic Issues    f/u for testosterone     HPI: Patient is a 45 y.o. male seen in the office today to follow up testosterone.  Was prescribed testosterone tablet however medication was in the thousands of dollars so he opted for gel form. Needs follow up on level since he has been consistently taking medication.  Needs refills on medication. Having mild headaches, notes headaches when he does not take Vit D supplement, no visual changes, no shortness of breath or chest pains.    Right shoulder pain in the last 2-3 months. Has had chronic pain but is now worse. 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 Already taking oxycodone ER and IR Taking aleve which has helped. Still able to move shoulder without problem.  No decrease in strength.    Review of Systems: Review of Systems  Constitutional: Negative for fatigue.  Eyes: Negative.   Respiratory: Negative for cough and shortness of breath.   Cardiovascular: Negative for palpitations.  Gastrointestinal: Negative.   Endocrine: Negative.   Genitourinary: Negative.   Musculoskeletal: Positive for myalgias and back pain (DDD of low back).       Right shoulder tenderness  Skin: Negative.   Allergic/Immunologic: Negative.   Neurological: Positive for headaches (mild). Negative for dizziness, weakness and numbness.  Hematological: Negative.     Past Medical History  Diagnosis Date  . Unspecified vitamin D deficiency   . Other abnormal blood chemistry   . Unspecified essential hypertension   . Hematuria, unspecified   . Lumbago   . Other malaise and fatigue   . Palpitations   . Heartburn    No past surgical history on file. Social History:   reports that he has been smoking  Cigarettes.  He has been smoking about 0.50 packs per day. He has never used smokeless tobacco. He reports that he drinks alcohol. He reports that he does not use illicit drugs.  No family history on file.  Medications: Patient's Medications  New Prescriptions   No medications on file  Previous Medications   ALPRAZOLAM (XANAX) 0.5 MG TABLET    TAKE 1 TABLET TWICE DAILY AS NEEDED FOR ANXIETY.   CHOLECALCIFEROL (VITAMIN D3) 5000 UNITS CAPS    Take one tablet by mouth Monday, Wednesday and Friday for Vitamin D Supplement.   OXYCODONE (ROXICODONE) 30 MG IMMEDIATE RELEASE TABLET    Take one tablet every 3 hours daily as needed for pain   OXYCODONE 30 MG 12 HR TABLET    One every 12 hours to relieve pain   TESTOSTERONE (AXIRON) 30 MG/ACT SOLN    Apply one pump actuation under arm daily   VARENICLINE (CHANTIX) 0.5 MG TABLET    Take 1 tablet (0.5 mg total) by mouth 2 (two) times daily. For smoking cessation  Modified Medications   No medications on file  Discontinued Medications   No medications on file     Physical Exam:  Filed Vitals:   03/12/15 1015  BP: 130/80  Pulse: 93  Temp: 98.6 F (37 C)  TempSrc: Oral  Resp: 20  Height:  (1.905 m)  Weight: 258 lb (117.028 kg)  SpO2: 96%   Body mass index is 32.25 kg/(m^2).  Physical Exam  Constitutional: He is oriented to person, place, and time. He appears well-developed and well-nourished.  Eyes: Conjunctivae and EOM are normal. Pupils are equal, round, and reactive to light.  Neck: Normal range of motion. Neck supple.  Cardiovascular: Normal rate, regular rhythm and normal heart sounds.   Pulmonary/Chest: Effort normal and breath sounds normal.  Musculoskeletal: He exhibits tenderness.       Right shoulder: He exhibits tenderness (point tenderness to posterier lateral shoulder, good ROM and strength). He exhibits normal range of motion, no swelling, no effusion and no crepitus.  Neurological: He is alert and oriented to person,  place, and time.  Skin: Skin is warm and dry.  Psychiatric: He has a normal mood and affect.    Labs reviewed: Basic Metabolic Panel:  Recent Labs  16/10/96 0940  NA 139  K 4.3  CL 97  CO2 27  GLUCOSE 100*  BUN 16  CREATININE 0.69*  CALCIUM 9.7   Liver Function Tests:  Recent Labs  10/14/14 0940  AST 20  ALT 21  ALKPHOS 88  BILITOT 0.3  PROT 6.9  ALBUMIN 4.3   No results for input(s): LIPASE, AMYLASE in the last 8760 hours. No results for input(s): AMMONIA in the last 8760 hours. CBC: No results for input(s): WBC, NEUTROABS, HGB, HCT, MCV, PLT in the last 8760 hours. Lipid Panel:  Recent Labs  10/14/14 0940  CHOL 168  HDL 52  LDLCALC 87  TRIG 143  CHOLHDL 3.2   TSH: No results for input(s): TSH in the last 8760 hours. A1C: Lab Results  Component Value Date   HGBA1C 5.6 10/14/2014     Assessment/Plan 1. Low testosterone -cont on Axiron 1 pump daily, will follow up teosterone level  - Testosterone, free  2. Vitamin D deficiency -discussed taking medication as prescribed, will follow up Vit D level - Vitamin D, 25-hydroxy  3. Right shoulder pain Due to over use over christmas with different truck for work.  - to rest shoulder and avoid overuse (has regular truck back at this time) -to use aleve twice daily routinely for 1 week then as needed -ice twice daily for 20 mins -notify if symptoms worsen of fail to improve   Keep follow up with Dr Chilton Si as scheduled.  Janene Harvey. Biagio Borg  [facility] [phone] 8 am - 5 pm) [phone] (after hours)

## 2015-03-12 NOTE — Patient Instructions (Signed)
To use Aleve twice daily for 1 week, ice shoulder twice daily for 20 mins for 1 week Avoid overuse May use biofreeze as needed throughout the day   Keep follow up with Dr Chilton Si

## 2015-03-13 ENCOUNTER — Other Ambulatory Visit: Payer: Self-pay | Admitting: *Deleted

## 2015-03-13 DIAGNOSIS — G894 Chronic pain syndrome: Secondary | ICD-10-CM

## 2015-03-13 DIAGNOSIS — E559 Vitamin D deficiency, unspecified: Secondary | ICD-10-CM

## 2015-03-13 DIAGNOSIS — M545 Low back pain: Secondary | ICD-10-CM

## 2015-03-13 LAB — VITAMIN D 25 HYDROXY (VIT D DEFICIENCY, FRACTURES): Vit D, 25-Hydroxy: 22.7 ng/mL — ABNORMAL LOW (ref 30.0–100.0)

## 2015-03-13 LAB — TESTOSTERONE, FREE: Testosterone, Free: 12 pg/mL (ref 6.8–21.5)

## 2015-03-13 MED ORDER — OXYCODONE HCL 30 MG PO TABS: ORAL_TABLET | ORAL | Status: DC

## 2015-03-13 MED ORDER — ALPRAZOLAM 0.5 MG PO TABS: ORAL_TABLET | ORAL | Status: DC

## 2015-03-13 MED ORDER — VITAMIN D3 125 MCG (5000 UT) PO CAPS: ORAL_CAPSULE | ORAL | Status: DC

## 2015-03-13 MED ORDER — OXYCODONE HCL ER 30 MG PO T12A: EXTENDED_RELEASE_TABLET | ORAL | Status: DC

## 2015-03-13 NOTE — Telephone Encounter (Signed)
Patient requested and faxed to pharmacy 

## 2015-03-13 NOTE — Telephone Encounter (Signed)
Patient requested and will pick up 

## 2015-04-10 ENCOUNTER — Other Ambulatory Visit: Payer: Self-pay | Admitting: *Deleted

## 2015-04-10 DIAGNOSIS — G894 Chronic pain syndrome: Secondary | ICD-10-CM

## 2015-04-10 DIAGNOSIS — M545 Low back pain: Secondary | ICD-10-CM

## 2015-04-10 MED ORDER — OXYCODONE HCL ER 30 MG PO T12A: EXTENDED_RELEASE_TABLET | ORAL | Status: DC

## 2015-04-10 MED ORDER — ALPRAZOLAM 0.5 MG PO TABS: ORAL_TABLET | ORAL | Status: DC

## 2015-04-10 MED ORDER — OXYCODONE HCL 30 MG PO TABS: ORAL_TABLET | ORAL | Status: DC

## 2015-04-10 NOTE — Telephone Encounter (Signed)
Patient requested and will pick up 

## 2015-05-07 ENCOUNTER — Other Ambulatory Visit: Payer: Self-pay | Admitting: *Deleted

## 2015-05-07 MED ORDER — TESTOSTERONE 30 MG/ACT TD SOLN: TRANSDERMAL | Status: DC

## 2015-05-07 NOTE — Telephone Encounter (Signed)
Medication to be printed tomorrow 05/08/15 with other medication to be picked up. Cannot fax this one to pharmacy e scribe.

## 2015-05-08 ENCOUNTER — Other Ambulatory Visit: Payer: Self-pay | Admitting: *Deleted

## 2015-05-08 DIAGNOSIS — G894 Chronic pain syndrome: Secondary | ICD-10-CM

## 2015-05-08 DIAGNOSIS — M545 Low back pain: Secondary | ICD-10-CM

## 2015-05-08 MED ORDER — TESTOSTERONE 30 MG/ACT TD SOLN: TRANSDERMAL | Status: DC

## 2015-05-08 MED ORDER — OXYCODONE HCL 30 MG PO TABS: ORAL_TABLET | ORAL | Status: DC

## 2015-05-08 MED ORDER — OXYCODONE HCL ER 30 MG PO T12A: EXTENDED_RELEASE_TABLET | ORAL | Status: DC

## 2015-05-08 MED ORDER — ALPRAZOLAM 0.5 MG PO TABS: ORAL_TABLET | ORAL | Status: DC

## 2015-05-08 NOTE — Telephone Encounter (Signed)
Patient requested all Rx's and will pick up

## 2015-06-01 ENCOUNTER — Other Ambulatory Visit: Payer: BLUE CROSS/BLUE SHIELD

## 2015-06-01 DIAGNOSIS — R739 Hyperglycemia, unspecified: Secondary | ICD-10-CM

## 2015-06-02 LAB — BASIC METABOLIC PANEL
BUN/Creatinine Ratio: 21 — ABNORMAL HIGH (ref 9–20)
BUN: 18 mg/dL (ref 6–24)
CALCIUM: 9.7 mg/dL (ref 8.7–10.2)
CHLORIDE: 99 mmol/L (ref 96–106)
CO2: 24 mmol/L (ref 18–29)
Creatinine, Ser: 0.84 mg/dL (ref 0.76–1.27)
GFR calc Af Amer: 123 mL/min/{1.73_m2} (ref >59)
GFR calc non Af Amer: 106 mL/min/{1.73_m2} (ref >59)
GLUCOSE: 124 mg/dL — AB (ref 65–99)
POTASSIUM: 4.3 mmol/L (ref 3.5–5.2)
Sodium: 140 mmol/L (ref 134–144)

## 2015-06-03 ENCOUNTER — Ambulatory Visit: Payer: BLUE CROSS/BLUE SHIELD | Admitting: Internal Medicine

## 2015-06-04 ENCOUNTER — Other Ambulatory Visit: Payer: Self-pay | Admitting: *Deleted

## 2015-06-04 DIAGNOSIS — G894 Chronic pain syndrome: Secondary | ICD-10-CM

## 2015-06-04 DIAGNOSIS — M545 Low back pain: Secondary | ICD-10-CM

## 2015-06-04 MED ORDER — OXYCODONE HCL ER 30 MG PO T12A: EXTENDED_RELEASE_TABLET | ORAL | Status: DC

## 2015-06-04 MED ORDER — OXYCODONE HCL 30 MG PO TABS: ORAL_TABLET | ORAL | Status: DC

## 2015-06-04 MED ORDER — ALPRAZOLAM 0.5 MG PO TABS: ORAL_TABLET | ORAL | Status: DC

## 2015-06-04 NOTE — Telephone Encounter (Signed)
Patient requested and will pick up 

## 2015-06-23 ENCOUNTER — Ambulatory Visit: Payer: Self-pay | Admitting: Internal Medicine

## 2015-06-30 ENCOUNTER — Encounter: Payer: Self-pay | Admitting: Internal Medicine

## 2015-06-30 ENCOUNTER — Ambulatory Visit (INDEPENDENT_AMBULATORY_CARE_PROVIDER_SITE_OTHER): Payer: BLUE CROSS/BLUE SHIELD | Admitting: Internal Medicine

## 2015-06-30 VITALS — BP 142/100 | HR 72 | Temp 98.1°F | Ht 75.0 in | Wt 264.0 lb

## 2015-06-30 DIAGNOSIS — E1140 Type 2 diabetes mellitus with diabetic neuropathy, unspecified: Secondary | ICD-10-CM

## 2015-06-30 DIAGNOSIS — M545 Low back pain: Secondary | ICD-10-CM

## 2015-06-30 DIAGNOSIS — G8929 Other chronic pain: Secondary | ICD-10-CM | POA: Diagnosis not present

## 2015-06-30 DIAGNOSIS — G894 Chronic pain syndrome: Secondary | ICD-10-CM | POA: Diagnosis not present

## 2015-06-30 DIAGNOSIS — E1165 Type 2 diabetes mellitus with hyperglycemia: Secondary | ICD-10-CM | POA: Diagnosis not present

## 2015-06-30 DIAGNOSIS — R5383 Other fatigue: Secondary | ICD-10-CM | POA: Insufficient documentation

## 2015-06-30 DIAGNOSIS — E559 Vitamin D deficiency, unspecified: Secondary | ICD-10-CM

## 2015-06-30 DIAGNOSIS — R5382 Chronic fatigue, unspecified: Secondary | ICD-10-CM

## 2015-06-30 DIAGNOSIS — IMO0002 Reserved for concepts with insufficient information to code with codable children: Secondary | ICD-10-CM | POA: Insufficient documentation

## 2015-06-30 MED ORDER — OXYCODONE HCL ER 30 MG PO T12A: EXTENDED_RELEASE_TABLET | ORAL | Status: DC

## 2015-06-30 MED ORDER — OXYCODONE HCL 30 MG PO TABS: ORAL_TABLET | ORAL | Status: DC

## 2015-06-30 MED ORDER — ALPRAZOLAM 0.5 MG PO TABS: ORAL_TABLET | ORAL | Status: DC

## 2015-06-30 MED ORDER — METFORMIN HCL 500 MG PO TABS: ORAL_TABLET | ORAL | Status: DC

## 2015-06-30 NOTE — Patient Instructions (Signed)
Weight loss is important. \Stop drinking soda with sugar. Sugar free drinks are OK.  Reduce intake of pasta, bread, and potatoes.

## 2015-06-30 NOTE — Progress Notes (Signed)
Patient ID: Taylor Kidd, male   DOB: 1970-04-17, 45 y.o.   MRN: [id number]    Facility  [facility]    Place of Service:   OFFICE    No Known Allergies  Chief Complaint  Patient presents with  . Medical Management of Chronic Issues    follow up on lab work 06/01/15 blood sugar slightly high at 124 mg.   . Fatigue    past few months, hasn't been taking his Testosterone like he should    HPI:  DM type 2, uncontrolled, with neuropathy ([place]) - controlled  Chronic left-sided low back pain, with sciatica presence unspecified - Unchanged. Requesting refill of his chronic pain medication.  Vitamin D deficiency - continues over-the-counter supplementation  Chronic fatigue - etiology uncertain. Patient thinks it may be related to his inability to get the testosterone and use it regularly.    Medications: Patient's Medications  New Prescriptions   No medications on file  Previous Medications   ALPRAZOLAM (XANAX) 0.5 MG TABLET    Take one tablet by mouth twice daily as needed for anxiety   CHOLECALCIFEROL (VITAMIN D3) 5000 UNITS CAPS    Take one tablet by mouth Monday, Wednesday and Friday for Vitamin D Supplement.   OXYCODONE (ROXICODONE) 30 MG IMMEDIATE RELEASE TABLET    Take one tablet every 3 hours daily as needed for pain   OXYCODONE 30 MG 12 HR TABLET    One every 12 hours to relieve pain   TESTOSTERONE (AXIRON) 30 MG/ACT SOLN    Apply one pump actuation under arm daily  Modified Medications   No medications on file  Discontinued Medications   VARENICLINE (CHANTIX) 0.5 MG TABLET    Take 1 tablet (0.5 mg total) by mouth 2 (two) times daily. For smoking cessation    Review of Systems  Constitutional: Positive for fatigue.  HENT: Negative.   Eyes: Negative.   Respiratory: Negative.   Cardiovascular: Positive for palpitations (mild. No pains).  Gastrointestinal: Negative.   Endocrine: Negative.   Genitourinary: Negative.   Musculoskeletal: Positive for myalgias and back pain (DDD  of low back).  Skin: Negative.   Allergic/Immunologic: Negative.   Neurological: Negative.   Hematological: Negative.   Psychiatric/Behavioral: Positive for dysphoric mood.    Filed Vitals:   06/30/15 1630  BP: 142/100  Pulse: 72  Temp: 98.1 F (36.7 C)  TempSrc: Oral  Height: '6\' 3"'  (1.905 m)  Weight: 264 lb (119.75 kg)  SpO2: 97%   Body mass index is 33 kg/(m^2). Filed Weights   06/30/15 1630  Weight: 264 lb (119.75 kg)     Physical Exam  Constitutional: He is oriented to person, place, and time. He appears well-developed and well-nourished. No distress.  HENT:  Right Ear: External ear normal.  Left Ear: External ear normal.  Nose: Nose normal.  Mouth/Throat: Oropharynx is clear and moist. No oropharyngeal exudate.  Eyes: Conjunctivae and EOM are normal. Pupils are equal, round, and reactive to light.  Neck: No JVD present. No tracheal deviation present. No thyromegaly present.  Cardiovascular: Normal rate, regular rhythm, normal heart sounds and intact distal pulses.  Exam reveals no gallop and no friction rub.   No murmur heard. Pulmonary/Chest: No respiratory distress. He has no wheezes. He has no rales. He exhibits no tenderness.  Abdominal: He exhibits no distension and no mass. There is no tenderness.  Musculoskeletal: Normal range of motion. He exhibits no edema or tenderness.  Lymphadenopathy:    He has no cervical adenopathy.  Neurological: He is alert and oriented to person, place, and time. He has normal reflexes. No cranial nerve deficit. Coordination normal.  Skin: No rash noted. No erythema. No pallor.  Psychiatric: He has a normal mood and affect. His behavior is normal. Judgment and thought content normal.    Labs reviewed: Lab Summary Latest Ref Rng 06/01/2015 10/14/2014  Hemoglobin 13.0-17.0 g/dL (None) (None)  Hematocrit 39.0-52.0 % (None) (None)  White count - (None) (None)  Platelet count - (None) (None)  Sodium 134 - 144 mmol/L 140 139    Potassium 3.5 - 5.2 mmol/L 4.3 4.3  Calcium 8.7 - 10.2 mg/dL 9.7 9.7  Phosphorus - (None) (None)  Creatinine 0.76 - 1.27 mg/dL 0.84 0.69(L)  AST 0 - 40 IU/L (None) 20  Alk Phos 39 - 117 IU/L (None) 88  Bilirubin 0.0 - 1.2 mg/dL (None) 0.3  Glucose 65 - 99 mg/dL 124(H) 100(H)  Cholesterol - (None) (None)  HDL cholesterol >39 mg/dL (None) 52  Triglycerides 0 - 149 mg/dL (None) 143  LDL Direct - (None) (None)  LDL Calc 0 - 99 mg/dL (None) 87  Total protein - (None) (None)  Albumin 3.5 - 5.5 g/dL (None) 4.3   Lab Results  Component Value Date   TSH 0.875 05/28/2012   Lab Results  Component Value Date   BUN 18 06/01/2015   BUN 16 10/14/2014   BUN 12 06/21/2013   Lab Results  Component Value Date   HGBA1C 5.6 10/14/2014    Assessment/Plan  1. DM type 2, uncontrolled, with neuropathy ([facility]) - metFORMIN (GLUCOPHAGE) 500 MG tablet; Take one with breakfast each morning to help control blood sugar  Dispense: 90 tablet; Refill: 4 - Hemoglobin A1c; Future - Basic metabolic panel; Future  2. Chronic left-sided low back pain, with sciatica presence unspecified - ALPRAZolam (XANAX) 0.5 MG tablet; Take one tablet by mouth twice daily as needed for anxiety  Dispense: 60 tablet; Refill: 0  3. Vitamin D deficiency Continue OTC supplements  4. Chronic fatigue Uncertain etiology. May be related to use narcotics for his chronic pains. We will continue to be alert for depression, although I do not think this is part of the problem at this time..  5. Chronic pain syndrome - oxycodone (ROXICODONE) 30 MG immediate release tablet; Take one tablet every 3 hours daily as needed for pain  Dispense: 240 tablet; Refill: 0 - oxyCODONE 30 MG 12 hr tablet; One every 12 hours to relieve pain  Dispense: 60 each; Refill: 0  6. Left low back pain, with sciatica presence unspecified - oxyCODONE 30 MG 12 hr tablet; One every 12 hours to relieve pain  Dispense: 60 each; Refill: 0

## 2015-07-30 ENCOUNTER — Other Ambulatory Visit: Payer: Self-pay

## 2015-07-30 DIAGNOSIS — G894 Chronic pain syndrome: Secondary | ICD-10-CM

## 2015-07-30 DIAGNOSIS — M545 Low back pain: Secondary | ICD-10-CM

## 2015-07-30 DIAGNOSIS — G8929 Other chronic pain: Secondary | ICD-10-CM

## 2015-07-30 MED ORDER — OXYCODONE HCL 30 MG PO TABS: ORAL_TABLET | ORAL | Status: DC

## 2015-07-30 MED ORDER — ALPRAZOLAM 0.5 MG PO TABS: ORAL_TABLET | ORAL | Status: DC

## 2015-07-30 MED ORDER — OXYCODONE HCL ER 30 MG PO T12A: EXTENDED_RELEASE_TABLET | ORAL | Status: DC

## 2015-08-28 ENCOUNTER — Other Ambulatory Visit: Payer: Self-pay

## 2015-08-28 DIAGNOSIS — G894 Chronic pain syndrome: Secondary | ICD-10-CM

## 2015-08-28 DIAGNOSIS — M545 Low back pain: Secondary | ICD-10-CM

## 2015-08-28 DIAGNOSIS — G8929 Other chronic pain: Secondary | ICD-10-CM

## 2015-08-28 MED ORDER — OXYCODONE HCL 30 MG PO TABS: ORAL_TABLET | ORAL | Status: DC

## 2015-08-28 MED ORDER — ALPRAZOLAM 0.5 MG PO TABS: ORAL_TABLET | ORAL | Status: DC

## 2015-08-28 MED ORDER — OXYCODONE HCL ER 30 MG PO T12A: EXTENDED_RELEASE_TABLET | ORAL | Status: DC

## 2015-09-24 ENCOUNTER — Other Ambulatory Visit: Payer: Self-pay

## 2015-09-24 DIAGNOSIS — M545 Low back pain: Principal | ICD-10-CM

## 2015-09-24 DIAGNOSIS — G8929 Other chronic pain: Secondary | ICD-10-CM

## 2015-09-24 MED ORDER — ALPRAZOLAM 0.5 MG PO TABS: ORAL_TABLET | ORAL | 0 refills | Status: DC

## 2015-09-25 ENCOUNTER — Other Ambulatory Visit: Payer: Self-pay | Admitting: *Deleted

## 2015-09-25 ENCOUNTER — Other Ambulatory Visit: Payer: Self-pay | Admitting: Internal Medicine

## 2015-09-25 DIAGNOSIS — M545 Low back pain: Principal | ICD-10-CM

## 2015-09-25 DIAGNOSIS — G8929 Other chronic pain: Secondary | ICD-10-CM

## 2015-09-25 DIAGNOSIS — G894 Chronic pain syndrome: Secondary | ICD-10-CM

## 2015-09-25 MED ORDER — OXYCODONE HCL 30 MG PO TABS: ORAL_TABLET | ORAL | 0 refills | Status: DC

## 2015-09-25 MED ORDER — OXYCODONE HCL ER 30 MG PO T12A: EXTENDED_RELEASE_TABLET | ORAL | 0 refills | Status: DC

## 2015-09-25 NOTE — Telephone Encounter (Signed)
Patient requested because pharmacy did not received Rx yesterday through fax.

## 2015-09-25 NOTE — Telephone Encounter (Signed)
Patient requested and will pick up 

## 2015-10-23 ENCOUNTER — Other Ambulatory Visit: Payer: Self-pay | Admitting: *Deleted

## 2015-10-23 DIAGNOSIS — G8929 Other chronic pain: Secondary | ICD-10-CM

## 2015-10-23 DIAGNOSIS — G894 Chronic pain syndrome: Secondary | ICD-10-CM

## 2015-10-23 DIAGNOSIS — M545 Low back pain: Secondary | ICD-10-CM

## 2015-10-23 MED ORDER — OXYCODONE HCL ER 30 MG PO T12A: EXTENDED_RELEASE_TABLET | ORAL | 0 refills | Status: DC

## 2015-10-23 MED ORDER — ALPRAZOLAM 0.5 MG PO TABS: ORAL_TABLET | ORAL | 0 refills | Status: DC

## 2015-10-23 MED ORDER — OXYCODONE HCL 30 MG PO TABS: ORAL_TABLET | ORAL | 0 refills | Status: DC

## 2015-10-23 NOTE — Telephone Encounter (Signed)
Spoke with patient and advised rx ready for pick-up and it will be at the front desk.  

## 2015-10-23 NOTE — Telephone Encounter (Signed)
Patient requested and will pick up 

## 2015-11-05 ENCOUNTER — Telehealth: Payer: Self-pay

## 2015-11-05 NOTE — Telephone Encounter (Signed)
I called [phone] as instructed on [facility]. I was told by representative that med did not require PA at this time for it was filled on 10-27-15 and paid for #[id number]. Patient not due for a PA until 11-15-15  (ok to request 2-3 days prior)  I asked if we will have to complete a PA every 19 days and was told yes, I asked if I could speak with another representative for I had never heard of this process. My call was transferred and ended before I could speak with another representative.  I called back 3 additional times and was given the number [phone]. I was able to speak with a representative, form for PA to be faxed.

## 2015-11-06 NOTE — Telephone Encounter (Signed)
Representative from [facility] [phone] called and needed to ask additional questions regarding Oxycodone Prior Authorization. Answered and went into clinical review. Answer within 24-48 hours.  Patient ID: [id number] Patient Group: [id number]

## 2015-11-09 NOTE — Telephone Encounter (Signed)
Received fax from [facility] and Oxycodone was APPROVED 11/07/2015-11/06/2016

## 2015-11-12 ENCOUNTER — Ambulatory Visit (INDEPENDENT_AMBULATORY_CARE_PROVIDER_SITE_OTHER): Payer: BLUE CROSS/BLUE SHIELD | Admitting: Nurse Practitioner

## 2015-11-12 ENCOUNTER — Encounter: Payer: Self-pay | Admitting: Nurse Practitioner

## 2015-11-12 VITALS — BP 140/90 | HR 78 | Temp 98.0°F | Resp 19 | Ht 75.0 in | Wt 269.0 lb

## 2015-11-12 DIAGNOSIS — E559 Vitamin D deficiency, unspecified: Secondary | ICD-10-CM

## 2015-11-12 DIAGNOSIS — R51 Headache: Secondary | ICD-10-CM | POA: Diagnosis not present

## 2015-11-12 DIAGNOSIS — E1140 Type 2 diabetes mellitus with diabetic neuropathy, unspecified: Secondary | ICD-10-CM | POA: Diagnosis not present

## 2015-11-12 DIAGNOSIS — E1165 Type 2 diabetes mellitus with hyperglycemia: Secondary | ICD-10-CM

## 2015-11-12 DIAGNOSIS — E291 Testicular hypofunction: Secondary | ICD-10-CM | POA: Diagnosis not present

## 2015-11-12 DIAGNOSIS — R7989 Other specified abnormal findings of blood chemistry: Secondary | ICD-10-CM

## 2015-11-12 DIAGNOSIS — R519 Headache, unspecified: Secondary | ICD-10-CM

## 2015-11-12 DIAGNOSIS — E785 Hyperlipidemia, unspecified: Secondary | ICD-10-CM

## 2015-11-12 DIAGNOSIS — IMO0002 Reserved for concepts with insufficient information to code with codable children: Secondary | ICD-10-CM

## 2015-11-12 LAB — CBC WITH DIFFERENTIAL/PLATELET
BASOS PCT: 0 %
Basophils Absolute: 0 cells/uL (ref 0–200)
EOS ABS: 170 {cells}/uL (ref 15–500)
Eosinophils Relative: 2 %
HEMATOCRIT: 46.5 % (ref 38.5–50.0)
Hemoglobin: 15.6 g/dL (ref 13.2–17.1)
Lymphocytes Relative: 22 %
Lymphs Abs: 1870 cells/uL (ref 850–3900)
MCH: 26.3 pg — ABNORMAL LOW (ref 27.0–33.0)
MCHC: 33.5 g/dL (ref 32.0–36.0)
MCV: 78.4 fL — AB (ref 80.0–100.0)
MONO ABS: 425 {cells}/uL (ref 200–950)
MPV: 9.3 fL (ref 7.5–12.5)
Monocytes Relative: 5 %
NEUTROS ABS: 6035 {cells}/uL (ref 1500–7800)
Neutrophils Relative %: 71 %
PLATELETS: 250 10*3/uL (ref 140–400)
RBC: 5.93 MIL/uL — ABNORMAL HIGH (ref 4.20–5.80)
RDW: 13.8 % (ref 11.0–15.0)
WBC: 8.5 10*3/uL (ref 3.8–10.8)

## 2015-11-12 LAB — COMPLETE METABOLIC PANEL WITH GFR
ALT: 26 U/L (ref 9–46)
AST: 23 U/L (ref 10–40)
Albumin: 4.1 g/dL (ref 3.6–5.1)
Alkaline Phosphatase: 90 U/L (ref 40–115)
BUN: 14 mg/dL (ref 7–25)
CO2: 27 mmol/L (ref 20–31)
Calcium: 9.3 mg/dL (ref 8.6–10.3)
Chloride: 102 mmol/L (ref 98–110)
Creat: 0.89 mg/dL (ref 0.60–1.35)
GFR, Est African American: >89 mL/min (ref >=60)
GLUCOSE: 102 mg/dL — AB (ref 65–99)
POTASSIUM: 4.3 mmol/L (ref 3.5–5.3)
SODIUM: 139 mmol/L (ref 135–146)
Total Bilirubin: 0.5 mg/dL (ref 0.2–1.2)
Total Protein: 6.7 g/dL (ref 6.1–8.1)

## 2015-11-12 LAB — LIPID PANEL
CHOL/HDL RATIO: 5.8 ratio — AB (ref <=5.0)
Cholesterol: 204 mg/dL — ABNORMAL HIGH (ref 125–200)
HDL: 35 mg/dL — AB (ref >=40)
LDL CALC: 117 mg/dL (ref <130)
Triglycerides: 261 mg/dL — ABNORMAL HIGH (ref <150)
VLDL: 52 mg/dL — ABNORMAL HIGH (ref <30)

## 2015-11-12 NOTE — Progress Notes (Signed)
Careteam: Patient Care Team: Estill Dooms, MD as PCP - General (Internal Medicine) Suella Broad, MD as Consulting Physician (Physical Medicine and Rehabilitation) Berle Mull, MD as Consulting Physician ([place])  Advanced Directive information Does patient have an advance directive?: No, Would patient like information on creating an advanced directive?: Yes - Educational materials given  No Known Allergies  Chief Complaint  Patient presents with  . Acute Visit    headache/pain in temples and base of skull when turning head x 2 weeks.  . Other    Pt wants testosterone level checked. Does not want flu vaccine                                             HPI: Patient is a 45 y.o. male seen in the office today due to headaches for 2 weeks. Not a constant pain. Happens when he moves his head from side to side. In the base of his skull and into his temples. Was constant when it started and was bilaterally, not it comes and goes. Happens 4-5 times an hour. Not tender to touch. Questions tension headache. Looked this up online Making him feel a little dizzy or light headed for a second when he moves his head and the pain hits. Had had blurred vision with the dizziness that last for a second.  No nausea or vomiting.  No increase in weakness to one side or extremity  Has had some increase in stress.  Does not take blood pressure at home.  Using aleve and tylenol as needed which is helpful.  Never had the flu shot- does not want one.   Does not routinely take his Vit D, forgets a lot.    Review of Systems:  Review of Systems  Constitutional: Negative for chills and fever.  HENT: Negative for congestion, dental problem and tinnitus.   Eyes: Positive for visual disturbance (occasionally with headache).  Respiratory: Negative for cough and shortness of breath.   Cardiovascular: Negative for chest pain, palpitations and leg swelling.  Gastrointestinal: Negative for  constipation and diarrhea.  Musculoskeletal: Positive for back pain (chronic pain). Negative for myalgias.  Skin: Negative.   Neurological: Positive for dizziness, light-headedness and headaches. Negative for tremors, seizures, syncope, facial asymmetry, speech difficulty, weakness and numbness.    Past Medical History:  Diagnosis Date  . Heartburn   . Hematuria, unspecified   . Lumbago   . Other abnormal blood chemistry   . Other malaise and fatigue   . Palpitations   . Unspecified essential hypertension   . Unspecified vitamin D deficiency    History reviewed. No pertinent surgical history. Social History:   reports that he has been smoking Cigarettes.  He has been smoking about 0.50 packs per day. He has never used smokeless tobacco. He reports that he drinks alcohol. He reports that he does not use drugs.  History reviewed. No pertinent family history.  Medications: Patient's Medications  New Prescriptions   No medications on file  Previous Medications   ALPRAZOLAM (XANAX) 0.5 MG TABLET    TAKE 1 TABLET TWICE DAILY AS NEEDED FOR ANXIETY.   CHOLECALCIFEROL (VITAMIN D3) 5000 UNITS CAPS    Take one tablet by mouth Monday, Wednesday and Friday for Vitamin D Supplement.   METFORMIN (GLUCOPHAGE) 500 MG TABLET    Take one with breakfast each morning to  help control blood sugar   OXYCODONE (ROXICODONE) 30 MG IMMEDIATE RELEASE TABLET    Take one tablet every 3 hours daily as needed for pain   OXYCODONE 30 MG 12 HR TABLET    One every 12 hours to relieve pain   TESTOSTERONE (AXIRON) 30 MG/ACT SOLN    Apply one pump actuation under arm daily  Modified Medications   No medications on file  Discontinued Medications   No medications on file     Physical Exam:  Vitals:   11/12/15 0858  BP: 140/90  Pulse: 78  Resp: 19  Temp: 98 F (36.7 C)  TempSrc: Oral  SpO2: 96%  Weight: 269 lb (122 kg)  Height: '6\' 3"'  (1.905 m)   Body mass index is 33.62 kg/m.  Physical Exam    Constitutional: He is oriented to person, place, and time. He appears well-developed and well-nourished. No distress.  HENT:  Head: Normocephalic and atraumatic.  Mouth/Throat: Oropharynx is clear and moist. No oropharyngeal exudate.  Eyes: Conjunctivae and EOM are normal. Pupils are equal, round, and reactive to light.  Neck: Normal range of motion. Neck supple.  Cardiovascular: Normal rate, regular rhythm and normal heart sounds.   Pulmonary/Chest: Effort normal and breath sounds normal.  Abdominal: Soft. Bowel sounds are normal.  Musculoskeletal: Normal range of motion. He exhibits no edema or tenderness.  Neurological: He is alert and oriented to person, place, and time. He has normal reflexes. He displays normal reflexes. No cranial nerve deficit or sensory deficit. He exhibits normal muscle tone. Coordination and gait normal.  Skin: Skin is warm and dry. He is not diaphoretic.  Psychiatric: He has a normal mood and affect.    Labs reviewed: Basic Metabolic Panel:  Recent Labs  06/01/15 0809  NA 140  K 4.3  CL 99  CO2 24  GLUCOSE 124*  BUN 18  CREATININE 0.84  CALCIUM 9.7   Liver Function Tests: No results for input(s): AST, ALT, ALKPHOS, BILITOT, PROT, ALBUMIN in the last 8760 hours. No results for input(s): LIPASE, AMYLASE in the last 8760 hours. No results for input(s): AMMONIA in the last 8760 hours. CBC: No results for input(s): WBC, NEUTROABS, HGB, HCT, MCV, PLT in the last 8760 hours. Lipid Panel: No results for input(s): CHOL, HDL, LDLCALC, TRIG, CHOLHDL, LDLDIRECT in the last 8760 hours. TSH: No results for input(s): TSH in the last 8760 hours. A1C: Lab Results  Component Value Date   HGBA1C 5.6 10/14/2014     Assessment/Plan 1. Low testosterone -conts on gel, will follow up Testosterone level  2. Hyperlipidemia - encouraged lifestyle modifications  - Lipid panel - CMP with eGFR  3. Vitamin D deficiency -encouraged compliance with supplement   - Vitamin D, 25-hydroxy  4. Headache, unspecified headache type -most likely tension headache - CBC with Differential/Platelets - will get CT Head Wo Contrast and DG Cervical Spine Complete; to rule out abnormalities  -given information about lifestyle modifications, to quit smoking -to cont aleve and tylenol PRN which gives relief  -to check blood pressure, notify if elevated <140/90  Jessica K. Harle Battiest  [facility] [phone] 8 am - 5 pm) [phone] (after hours)

## 2015-11-12 NOTE — Patient Instructions (Signed)
Tension Headache A tension headache is a feeling of pain, pressure, or aching that is often felt over the front and sides of the head. The pain can be dull, or it can feel tight (constricting). Tension headaches are not normally associated with nausea or vomiting, and they do not get worse with physical activity. Tension headaches can last from 30 minutes to several days. This is the most common type of headache. CAUSES The exact cause of this condition is not known. Tension headaches often begin after stress, anxiety, or depression. Other triggers may include:  Alcohol.  Too much caffeine, or caffeine withdrawal.  Respiratory infections, such as colds, flu, or sinus infections.  Dental problems or teeth clenching.  Fatigue.  Holding your head and neck in the same position for a long period of time, such as while using a computer.  Smoking. SYMPTOMS Symptoms of this condition include:  A feeling of pressure around the head.  Dull, aching head pain.  Pain felt over the front and sides of the head.  Tenderness in the muscles of the head, neck, and shoulders. DIAGNOSIS This condition may be diagnosed based on your symptoms and a physical exam. Tests may be done, such as a CT scan or an MRI of your head. These tests may be done if your symptoms are severe or unusual. TREATMENT This condition may be treated with lifestyle changes and medicines to help relieve symptoms. HOME CARE INSTRUCTIONS Managing Pain  Take over-the-counter and prescription medicines only as told by your health care provider.  Lie down in a dark, quiet room when you have a headache.  If directed, apply ice to the head and neck area:  Put ice in a plastic bag.  Place a towel between your skin and the bag.  Leave the ice on for 20 minutes, 2-3 times per day.  Use a heating pad or a hot shower to apply heat to the head and neck area as told by your health care provider. Eating and Drinking  Eat meals on  a regular schedule.  Limit alcohol use.  Decrease your caffeine intake, or stop using caffeine. General Instructions  Keep all follow-up visits as told by your health care provider. This is important.  Keep a headache journal to help find out what may trigger your headaches. For example, write down:  What you eat and drink.  How much sleep you get.  Any change to your diet or medicines.  Try massage or other relaxation techniques.  Limit stress.  Sit up straight, and avoid tensing your muscles.  Do not use tobacco products, including cigarettes, chewing tobacco, or e-cigarettes. If you need help quitting, ask your health care provider.  Exercise regularly as told by your health care provider.  Get 7-9 hours of sleep, or the amount recommended by your health care provider. SEEK MEDICAL CARE IF:  Your symptoms are not helped by medicine.  You have a headache that is different from what you normally experience.  You have nausea or you vomit.  You have a fever. SEEK IMMEDIATE MEDICAL CARE IF:  Your headache becomes severe.  You have repeated vomiting.  You have a stiff neck.  You have a loss of vision.  You have problems with speech.  You have pain in your eye or ear.  You have muscular weakness or loss of muscle control.  You lose your balance or you have trouble walking.  You feel faint or you pass out.  You have confusion.     This information is not intended to replace advice given to you by your health care provider. Make sure you discuss any questions you have with your health care provider.   Document Released: 02/07/2005 Document Revised: 10/29/2014 Document Reviewed: 06/02/2014 Elsevier Interactive Patient Education 2016 [employer]  

## 2015-11-13 LAB — TESTOSTERONE: Testosterone: 194 ng/dL — ABNORMAL LOW (ref 250–827)

## 2015-11-13 LAB — VITAMIN D 25 HYDROXY (VIT D DEFICIENCY, FRACTURES): Vit D, 25-Hydroxy: 29 ng/mL — ABNORMAL LOW (ref 30–100)

## 2015-11-19 ENCOUNTER — Ambulatory Visit
Admission: RE | Admit: 2015-11-19 | Discharge: 2015-11-19 | Disposition: A | Discharge status: Home / Self Care | Payer: Self-pay | Source: Ambulatory Visit | Attending: Nurse Practitioner | Admitting: Nurse Practitioner

## 2015-11-19 ENCOUNTER — Other Ambulatory Visit: Payer: Self-pay

## 2015-11-19 ENCOUNTER — Ambulatory Visit
Admission: RE | Admit: 2015-11-19 | Discharge: 2015-11-19 | Disposition: A | Discharge status: Home / Self Care | Payer: BLUE CROSS/BLUE SHIELD | Source: Ambulatory Visit | Attending: Nurse Practitioner | Admitting: Nurse Practitioner

## 2015-11-19 ENCOUNTER — Telehealth: Payer: Self-pay | Admitting: *Deleted

## 2015-11-19 DIAGNOSIS — R519 Headache, unspecified: Secondary | ICD-10-CM

## 2015-11-19 DIAGNOSIS — R51 Headache: Principal | ICD-10-CM

## 2015-11-19 MED ORDER — TESTOSTERONE 30 MG/ACT TD SOLN: TRANSDERMAL | 5 refills | Status: DC

## 2015-11-19 NOTE — Telephone Encounter (Signed)
Patient notified and agreed. Medication list updated and Rx printed.  

## 2015-11-19 NOTE — Telephone Encounter (Signed)
If he is taking the 30  Mg per pump okay to increase it to 60 mg per pump (did not want to increase medication if he was not taking it as prescribed

## 2015-11-19 NOTE — Telephone Encounter (Signed)
Patient called and wanted to know if you can change his Axiron gel to Androgel due to his Testosterone still being low. Please advise.

## 2015-11-19 NOTE — Telephone Encounter (Signed)
I do not have Axiron 60mg  come up in our meds and orders. Should he do 2 pumps once daily under arm? Please Advise.

## 2015-11-19 NOTE — Telephone Encounter (Signed)
Yes if 1 pump is 30 okay to do 2 pumps for 60

## 2015-11-20 ENCOUNTER — Other Ambulatory Visit: Payer: Self-pay | Admitting: *Deleted

## 2015-11-20 DIAGNOSIS — M545 Low back pain: Secondary | ICD-10-CM

## 2015-11-20 DIAGNOSIS — G894 Chronic pain syndrome: Secondary | ICD-10-CM

## 2015-11-20 DIAGNOSIS — G8929 Other chronic pain: Secondary | ICD-10-CM

## 2015-11-20 MED ORDER — OXYCODONE HCL ER 30 MG PO T12A: EXTENDED_RELEASE_TABLET | ORAL | 0 refills | Status: DC

## 2015-11-20 MED ORDER — ALPRAZOLAM 0.5 MG PO TABS: ORAL_TABLET | ORAL | 0 refills | Status: DC

## 2015-11-20 MED ORDER — OXYCODONE HCL 30 MG PO TABS: ORAL_TABLET | ORAL | 0 refills | Status: DC

## 2015-11-20 NOTE — Telephone Encounter (Signed)
Patient requested and Taylor Kidd will pick up

## 2015-12-08 ENCOUNTER — Ambulatory Visit (INDEPENDENT_AMBULATORY_CARE_PROVIDER_SITE_OTHER): Payer: BLUE CROSS/BLUE SHIELD | Admitting: Nurse Practitioner

## 2015-12-08 ENCOUNTER — Encounter: Payer: Self-pay | Admitting: Nurse Practitioner

## 2015-12-08 VITALS — BP 128/82 | HR 86 | Temp 97.7°F | Resp 17 | Ht 75.0 in | Wt 266.0 lb

## 2015-12-08 DIAGNOSIS — E1165 Type 2 diabetes mellitus with hyperglycemia: Secondary | ICD-10-CM | POA: Diagnosis not present

## 2015-12-08 DIAGNOSIS — E1140 Type 2 diabetes mellitus with diabetic neuropathy, unspecified: Secondary | ICD-10-CM | POA: Diagnosis not present

## 2015-12-08 DIAGNOSIS — Z23 Encounter for immunization: Secondary | ICD-10-CM

## 2015-12-08 DIAGNOSIS — T148XXA Other injury of unspecified body region, initial encounter: Secondary | ICD-10-CM

## 2015-12-08 DIAGNOSIS — IMO0002 Reserved for concepts with insufficient information to code with codable children: Secondary | ICD-10-CM

## 2015-12-08 LAB — HEMOGLOBIN A1C
HEMOGLOBIN A1C: 5.5 % (ref <5.7)
MEAN PLASMA GLUCOSE: 111 mg/dL

## 2015-12-08 NOTE — Progress Notes (Signed)
Careteam: Patient Care Team: Kimber RelicArthur G Green, MD as PCP - General (Internal Medicine) Sheran Luzichard Ramos, MD as Consulting Physician (Physical Medicine and Rehabilitation) Pati GalloJames Kramer, MD as Consulting Physician (Sports Medicine)  Advanced Directive information Does patient have an advance directive?: No, Would patient like information on creating an advanced directive?: Yes - Educational materials given  No Known Allergies  Chief Complaint  Patient presents with  . Acute Visit    Has a piece of glass in right foot. Unsure of what happen.      HPI: Patient is a 45 y.o. male seen in the office today due to pain in right foot. Noticed yesterday around 3 pm. Foot was very sore. Started looking at it today and noticed there was something in there. Unsure when it happened. Sore to ball of bottom of foot.   Review of Systems:  Review of Systems  Constitutional: Negative for chills and fever.  HENT: Negative for congestion, dental problem and tinnitus.   Eyes: Positive for visual disturbance (occasionally with headache).  Respiratory: Negative for cough and shortness of breath.   Cardiovascular: Negative for chest pain, palpitations and leg swelling.  Gastrointestinal: Negative for constipation and diarrhea.  Musculoskeletal: Positive for back pain (chronic pain). Negative for myalgias.       Pain to bottom of right foot, notices shiny object in foot  Skin: Negative.   Neurological: Negative for dizziness, tremors, seizures, syncope, facial asymmetry, speech difficulty, weakness, light-headedness, numbness and headaches.       Headache has improved     Past Medical History:  Diagnosis Date  . Heartburn   . Hematuria, unspecified   . Lumbago   . Other abnormal blood chemistry   . Other malaise and fatigue   . Palpitations   . Unspecified essential hypertension   . Unspecified vitamin D deficiency    History reviewed. No pertinent surgical history. Social History:   reports  that he has been smoking Cigarettes.  He has been smoking about 0.50 packs per day. He has never used smokeless tobacco. He reports that he drinks alcohol. He reports that he does not use drugs.  History reviewed. No pertinent family history.  Medications: Patient's Medications  New Prescriptions   No medications on file  Previous Medications   ALPRAZOLAM (XANAX) 0.5 MG TABLET    TAKE 1 TABLET TWICE DAILY AS NEEDED FOR ANXIETY.   CHOLECALCIFEROL (VITAMIN D3) 5000 UNITS CAPS    Take one tablet by mouth Monday, Wednesday and Friday for Vitamin D Supplement.   METFORMIN (GLUCOPHAGE) 500 MG TABLET    Take one with breakfast each morning to help control blood sugar   OXYCODONE (ROXICODONE) 30 MG IMMEDIATE RELEASE TABLET    Take one tablet every 3 hours daily as needed for pain   OXYCODONE 30 MG 12 HR TABLET    One every 12 hours to relieve pain   TESTOSTERONE (AXIRON) 30 MG/ACT SOLN    Apply two pumps actuation under arm daily  Modified Medications   No medications on file  Discontinued Medications   No medications on file     Physical Exam:  Vitals:   12/08/15 1302  BP: 128/82  Pulse: 86  Resp: 17  Temp: 97.7 F (36.5 C)  TempSrc: Oral  SpO2: 97%  Weight: 266 lb (120.7 kg)  Height: 6\' 3"  (1.905 m)   Body mass index is 33.25 kg/m.  Physical Exam  Constitutional: He is oriented to person, place, and time. He appears well-developed  and well-nourished. No distress.  Neck: Normal range of motion. Neck supple.  Musculoskeletal: He exhibits no edema.  Neurological: He is alert and oriented to person, place, and time. He has normal reflexes. He displays normal reflexes. No cranial nerve deficit or sensory deficit. He exhibits normal muscle tone. Coordination and gait normal.  Monofilament intact   Skin: Skin is warm and dry. He is not diaphoretic. No erythema.  0.5 cm needle like splinter pulled from foot  Psychiatric: He has a normal mood and affect.    Labs reviewed: Basic  Metabolic Panel:  Recent Labs  16/10/96 0809 11/12/15 0949  NA 140 139  K 4.3 4.3  CL 99 102  CO2 24 27  GLUCOSE 124* 102*  BUN 18 14  CREATININE 0.84 0.89  CALCIUM 9.7 9.3   Liver Function Tests:  Recent Labs  11/12/15 0949  AST 23  ALT 26  ALKPHOS 90  BILITOT 0.5  PROT 6.7  ALBUMIN 4.1   No results for input(s): LIPASE, AMYLASE in the last 8760 hours. No results for input(s): AMMONIA in the last 8760 hours. CBC:  Recent Labs  11/12/15 0949  WBC 8.5  NEUTROABS 6,035  HGB 15.6  HCT 46.5  MCV 78.4*  PLT 250   Lipid Panel:  Recent Labs  11/12/15 0949  CHOL 204*  HDL 35*  LDLCALC 117  TRIG 261*  CHOLHDL 5.8*   TSH: No results for input(s): TSH in the last 8760 hours. A1C: Lab Results  Component Value Date   HGBA1C 5.6 10/14/2014     Assessment/Plan 1. DM type 2, uncontrolled, with neuropathy (HCC) -currently on metformin 500 mg daily, cont lifestyle modifications - Microalbumin, urine - Hemoglobin A1c  2. Splinter in skin -small 0.5 cm metal splinter pulled from ball of right foot after cleansed with alcohol prep. Pt tolerated well and felt immediate relief.  -to notify if redness, drainage, pain recurs  -Tetanus vaccine updated   Adaliah Hiegel K. Biagio Borg  [facility] [phone] 8 am - 5 pm) [phone] (after hours)

## 2015-12-08 NOTE — Addendum Note (Signed)
Addended by: Chriss DriverLANE, TERRY L on: 12/08/2015 02:04 PM   Modules accepted: Orders

## 2015-12-08 NOTE — Patient Instructions (Signed)
Monitor bottom of foot for the next few days for any swelling, redness or drainage.

## 2015-12-09 ENCOUNTER — Telehealth: Payer: Self-pay | Admitting: *Deleted

## 2015-12-09 LAB — MICROALBUMIN, URINE: MICROALB UR: 1 mg/dL

## 2015-12-09 NOTE — Telephone Encounter (Signed)
Patient stated that he was in yesterday and forgot to ask about getting a Nicotine Patch to help stop smoking. Please Advise.

## 2015-12-09 NOTE — Telephone Encounter (Signed)
Patient notified and agreed.  

## 2015-12-09 NOTE — Telephone Encounter (Signed)
Nicotine patches are OTC, it depends on how many cigarettes he smokes per day as to what dose he needs to be on

## 2015-12-17 ENCOUNTER — Telehealth: Payer: Self-pay

## 2015-12-17 NOTE — Telephone Encounter (Signed)
Patient called to ask about getting his medications refilled. He needs a prescription refill for oxycodone IR 30 mg, oxycodone ER 30 mg, and alprazolam 0.5 mg. All three of these medications were filled on 11/20/15 so they cannot be refilled until tomorrow 12/18/15. I called patient to let him know that the prescriptions would be written in the morning and that staff would call him once they were ready to pick up. He verbalized understanding.

## 2015-12-18 ENCOUNTER — Other Ambulatory Visit: Payer: Self-pay | Admitting: *Deleted

## 2015-12-18 DIAGNOSIS — G8929 Other chronic pain: Secondary | ICD-10-CM

## 2015-12-18 DIAGNOSIS — G894 Chronic pain syndrome: Secondary | ICD-10-CM

## 2015-12-18 DIAGNOSIS — M545 Low back pain: Secondary | ICD-10-CM

## 2015-12-18 MED ORDER — OXYCODONE HCL 30 MG PO TABS: ORAL_TABLET | ORAL | 0 refills | Status: DC

## 2015-12-18 MED ORDER — OXYCODONE HCL ER 30 MG PO T12A: EXTENDED_RELEASE_TABLET | ORAL | 0 refills | Status: DC

## 2015-12-18 MED ORDER — ALPRAZOLAM 0.5 MG PO TABS: ORAL_TABLET | ORAL | 0 refills | Status: DC

## 2015-12-18 NOTE — Telephone Encounter (Signed)
Patient requested and will pick up 

## 2016-01-13 ENCOUNTER — Other Ambulatory Visit: Payer: Self-pay

## 2016-01-13 DIAGNOSIS — G894 Chronic pain syndrome: Secondary | ICD-10-CM

## 2016-01-13 MED ORDER — OXYCODONE HCL ER 30 MG PO T12A: EXTENDED_RELEASE_TABLET | ORAL | 0 refills | Status: DC

## 2016-01-13 MED ORDER — OXYCODONE HCL 30 MG PO TABS: ORAL_TABLET | ORAL | 0 refills | Status: DC

## 2016-01-15 ENCOUNTER — Other Ambulatory Visit: Payer: Self-pay | Admitting: Internal Medicine

## 2016-01-15 DIAGNOSIS — M545 Low back pain: Principal | ICD-10-CM

## 2016-01-15 DIAGNOSIS — G8929 Other chronic pain: Secondary | ICD-10-CM

## 2016-02-11 ENCOUNTER — Encounter: Payer: Self-pay | Admitting: *Deleted

## 2016-02-11 ENCOUNTER — Other Ambulatory Visit: Payer: Self-pay | Admitting: Internal Medicine

## 2016-02-11 ENCOUNTER — Other Ambulatory Visit: Payer: Self-pay | Admitting: *Deleted

## 2016-02-11 ENCOUNTER — Telehealth: Payer: Self-pay | Admitting: *Deleted

## 2016-02-11 ENCOUNTER — Other Ambulatory Visit: Payer: Self-pay

## 2016-02-11 DIAGNOSIS — G894 Chronic pain syndrome: Secondary | ICD-10-CM

## 2016-02-11 DIAGNOSIS — R52 Pain, unspecified: Secondary | ICD-10-CM

## 2016-02-11 MED ORDER — OXYCODONE HCL 30 MG PO TABS: ORAL_TABLET | ORAL | 0 refills | Status: DC

## 2016-02-11 MED ORDER — OXYCODONE HCL ER 30 MG PO T12A: EXTENDED_RELEASE_TABLET | ORAL | 0 refills | Status: DC

## 2016-02-11 NOTE — Telephone Encounter (Signed)
Pt came in today to pick up medication refill, [facility], pt needed to update his controlled substance contract. I advised to pt on the phone call that he needed to pick up his prescription himself which he was hesitant about. I advised to pt he could not send anyone to pick this up. Pt came is and was told to either provide a urine sample or oral swab, pt immediatly stated he couldn't urinate and he didn't have time for a oral swab. Pt stated he would come back tomorrow, at this time Aram BeechamCynthia got involved and stated pt would need to to drug screen today. Pt came back in and wanted to urinate, pt was given the instructions and went into the bathroom and urinated in the drug screen cup. I went in the bathroom to retreive the sample and noticed that the temperature was not registering on the cup. Drug Screen sent to be tested with comments " temperature not registered" pt was given rx's.

## 2016-02-11 NOTE — Telephone Encounter (Signed)
Patient requested and will pick up 

## 2016-02-11 NOTE — Telephone Encounter (Signed)
noted 

## 2016-02-16 ENCOUNTER — Other Ambulatory Visit: Payer: Self-pay | Admitting: *Deleted

## 2016-02-16 DIAGNOSIS — M545 Low back pain: Principal | ICD-10-CM

## 2016-02-16 DIAGNOSIS — G8929 Other chronic pain: Secondary | ICD-10-CM

## 2016-02-16 MED ORDER — TESTOSTERONE 30 MG/ACT TD SOLN: TRANSDERMAL | 5 refills | Status: DC

## 2016-02-16 MED ORDER — ALPRAZOLAM 0.5 MG PO TABS: ORAL_TABLET | ORAL | 0 refills | Status: DC

## 2016-02-16 NOTE — Telephone Encounter (Signed)
Patient called and requested Rx Refills. Phoned to pharmacy.

## 2016-02-18 LAB — PAIN MGMT, PROFILE 6 W/CONF, U
6 Acetylmorphine: NEGATIVE ng/mL (ref <10)
Alcohol Metabolites: NEGATIVE ng/mL (ref <500)
Alphahydroxyalprazolam: 175 ng/mL — ABNORMAL HIGH (ref <25)
Alphahydroxymidazolam: NEGATIVE ng/mL (ref <50)
Alphahydroxytriazolam: NEGATIVE ng/mL (ref <50)
Aminoclonazepam: NEGATIVE ng/mL (ref <25)
Amphetamines: NEGATIVE ng/mL (ref <500)
Barbiturates: NEGATIVE ng/mL (ref <300)
Benzodiazepines: POSITIVE ng/mL — AB (ref <100)
Cocaine Metabolite: NEGATIVE ng/mL (ref <150)
Codeine: NEGATIVE ng/mL (ref <50)
Creatinine: 164 mg/dL (ref >=20.0)
Hydrocodone: NEGATIVE ng/mL (ref <50)
Hydromorphone: NEGATIVE ng/mL (ref <50)
Hydroxyethylflurazepam: NEGATIVE ng/mL (ref <50)
Lorazepam: NEGATIVE ng/mL (ref <50)
Marijuana Metabolite: NEGATIVE ng/mL (ref <20)
Methadone Metabolite: NEGATIVE ng/mL (ref <100)
Morphine: NEGATIVE ng/mL (ref <50)
Nordiazepam: NEGATIVE ng/mL (ref <50)
Norhydrocodone: NEGATIVE ng/mL (ref <50)
Noroxycodone: >10000 ng/mL — ABNORMAL HIGH (ref <50)
Opiates: NEGATIVE ng/mL (ref <100)
Oxazepam: NEGATIVE ng/mL (ref <50)
Oxidant: NEGATIVE ug/mL (ref <200)
Oxycodone: 11006 ng/mL — ABNORMAL HIGH (ref <50)
Oxycodone: POSITIVE ng/mL — AB (ref <100)
Oxymorphone: 27017 ng/mL — ABNORMAL HIGH (ref <50)
Phencyclidine: NEGATIVE ng/mL (ref <25)
Please note:: 0
Temazepam: NEGATIVE ng/mL (ref <50)
pH: 7.13 (ref 4.5–9.0)

## 2016-03-09 ENCOUNTER — Ambulatory Visit: Payer: BLUE CROSS/BLUE SHIELD | Admitting: Internal Medicine

## 2016-03-11 ENCOUNTER — Other Ambulatory Visit: Payer: Self-pay | Admitting: *Deleted

## 2016-03-11 ENCOUNTER — Other Ambulatory Visit: Payer: Self-pay | Admitting: Internal Medicine

## 2016-03-11 DIAGNOSIS — M545 Low back pain: Principal | ICD-10-CM

## 2016-03-11 DIAGNOSIS — G8929 Other chronic pain: Secondary | ICD-10-CM

## 2016-03-11 DIAGNOSIS — G894 Chronic pain syndrome: Secondary | ICD-10-CM

## 2016-03-11 MED ORDER — OXYCODONE HCL 30 MG PO TABS: ORAL_TABLET | ORAL | 0 refills | Status: DC

## 2016-03-11 MED ORDER — OXYCODONE HCL ER 30 MG PO T12A: EXTENDED_RELEASE_TABLET | ORAL | 0 refills | Status: DC

## 2016-03-11 NOTE — Telephone Encounter (Signed)
Patient requested and will pick up 

## 2016-03-20 ENCOUNTER — Other Ambulatory Visit: Payer: Self-pay | Admitting: Internal Medicine

## 2016-03-20 DIAGNOSIS — M545 Low back pain: Principal | ICD-10-CM

## 2016-03-20 DIAGNOSIS — G8929 Other chronic pain: Secondary | ICD-10-CM

## 2016-03-21 MED ORDER — ALPRAZOLAM 0.5 MG PO TABS: ORAL_TABLET | ORAL | 0 refills | Status: DC

## 2016-03-21 NOTE — Telephone Encounter (Signed)
Spoke with patient, patient is using [facility] bid [facility] Patient would like for me to delete [facility] from his pharmacy list.  [facility] rx [facility].

## 2016-03-21 NOTE — Telephone Encounter (Signed)
Left message on voicemail for patient to return call when available:   Reason for call verify instructions for testosterone pump

## 2016-03-29 ENCOUNTER — Encounter: Payer: Self-pay | Admitting: Internal Medicine

## 2016-03-29 ENCOUNTER — Ambulatory Visit (INDEPENDENT_AMBULATORY_CARE_PROVIDER_SITE_OTHER): Payer: BLUE CROSS/BLUE SHIELD | Admitting: Internal Medicine

## 2016-03-29 VITALS — BP 142/62 | HR 88 | Temp 98.2°F | Ht 75.0 in | Wt 255.0 lb

## 2016-03-29 DIAGNOSIS — K2100 Gastro-esophageal reflux disease with esophagitis, without bleeding: Secondary | ICD-10-CM

## 2016-03-29 DIAGNOSIS — F17200 Nicotine dependence, unspecified, uncomplicated: Secondary | ICD-10-CM | POA: Diagnosis not present

## 2016-03-29 DIAGNOSIS — F909 Attention-deficit hyperactivity disorder, unspecified type: Secondary | ICD-10-CM | POA: Diagnosis not present

## 2016-03-29 DIAGNOSIS — IMO0002 Reserved for concepts with insufficient information to code with codable children: Secondary | ICD-10-CM

## 2016-03-29 DIAGNOSIS — G8929 Other chronic pain: Secondary | ICD-10-CM | POA: Diagnosis not present

## 2016-03-29 DIAGNOSIS — M545 Low back pain: Secondary | ICD-10-CM | POA: Diagnosis not present

## 2016-03-29 DIAGNOSIS — M25511 Pain in right shoulder: Secondary | ICD-10-CM | POA: Diagnosis not present

## 2016-03-29 DIAGNOSIS — E1165 Type 2 diabetes mellitus with hyperglycemia: Secondary | ICD-10-CM

## 2016-03-29 DIAGNOSIS — E1140 Type 2 diabetes mellitus with diabetic neuropathy, unspecified: Secondary | ICD-10-CM

## 2016-03-29 DIAGNOSIS — S43421D Sprain of right rotator cuff capsule, subsequent encounter: Secondary | ICD-10-CM | POA: Diagnosis not present

## 2016-03-29 MED ORDER — METFORMIN HCL 500 MG PO TABS
ORAL_TABLET | ORAL | 4 refills | Status: DC
Start: 2016-03-29 — End: 2017-05-31

## 2016-03-29 NOTE — Progress Notes (Signed)
Facility  [place]    Place of Service:   OFFICE    No Known Allergies  Chief Complaint  Patient presents with  . Medical Management of Chronic Issues    medication management blood sugar, chronic back pain    HPI:   Chronic left-sided low back pain, with sciatica presence unspecified  Chronic right shoulder pain - has had off and on since 2011. Has seen Dr. Nelva Bush and had injection in the past.  Hyperactive -- feels like he must be doing something all the time  Sprain of right rotator cuff capsule, subsequent encounter - has had off and on since 2011. Has seen Dr. Nelva Bush and had injection in the past.  Tobacco use disorder - 1/2 PPD  Gastroesophageal reflux disease with esophagitis - using Tums and Pecid  DM type 2, uncontrolled, with neuropathy ([place]) - does not check glucose at home    Medications: Patient's Medications  New Prescriptions   No medications on file  Previous Medications   ALPRAZOLAM (XANAX) 0.5 MG TABLET    Take one tablet by mouth twice daily as needed for anxiety   CHOLECALCIFEROL (VITAMIN D3) 5000 UNITS CAPS    Take one tablet by mouth Monday, Wednesday and Friday for Vitamin D Supplement.   METFORMIN (GLUCOPHAGE) 500 MG TABLET    Take one with breakfast each morning to help control blood sugar   OXYCODONE (ROXICODONE) 30 MG IMMEDIATE RELEASE TABLET    Take one tablet every 3 hours daily as needed for pain   OXYCODONE 30 MG 12 HR TABLET    One every 12 hours to relieve pain   TESTOSTERONE (AXIRON) 30 MG/ACT SOLN    Apply two pumps actuation under arm daily  Modified Medications   No medications on file  Discontinued Medications   No medications on file    Review of Systems  Constitutional: Positive for fatigue.  HENT: Negative.   Eyes: Negative.   Respiratory: Negative.   Cardiovascular: Positive for palpitations (mild. No pains).  Gastrointestinal: Negative.   Endocrine: Negative.   Genitourinary: Negative.   Musculoskeletal: Positive for  back pain (DDD of low back) and myalgias.  Skin: Negative.   Allergic/Immunologic: Negative.   Neurological: Negative.   Hematological: Negative.   Psychiatric/Behavioral: Positive for dysphoric mood.    Vitals:   03/29/16 1500  BP: (!) 142/62  Pulse: 88  Temp: 98.2 F (36.8 C)  TempSrc: Oral  SpO2: 96%  Weight: 255 lb (115.7 kg)  Height: '6\' 3"'  (1.905 m)   Body mass index is 31.87 kg/m. Wt Readings from Last 3 Encounters:  03/29/16 255 lb (115.7 kg)  12/08/15 266 lb (120.7 kg)  11/12/15 269 lb (122 kg)      Physical Exam  Constitutional: He is oriented to person, place, and time. He appears well-developed and well-nourished. No distress.  HENT:  Right Ear: External ear normal.  Left Ear: External ear normal.  Nose: Nose normal.  Mouth/Throat: Oropharynx is clear and moist. No oropharyngeal exudate.  Eyes: Conjunctivae and EOM are normal. Pupils are equal, round, and reactive to light.  Neck: No JVD present. No tracheal deviation present. No thyromegaly present.  Cardiovascular: Normal rate, regular rhythm, normal heart sounds and intact distal pulses.  Exam reveals no gallop and no friction rub.   No murmur heard. Pulmonary/Chest: No respiratory distress. He has no wheezes. He has no rales. He exhibits no tenderness.  Abdominal: He exhibits no distension and no mass. There is no tenderness.  Musculoskeletal: Normal range of motion. He exhibits no edema or tenderness.  Lymphadenopathy:    He has no cervical adenopathy.  Neurological: He is alert and oriented to person, place, and time. He has normal reflexes. No cranial nerve deficit. Coordination normal.  Skin: No rash noted. No erythema. No pallor.  Psychiatric: He has a normal mood and affect. His behavior is normal. Judgment and thought content normal.    Labs reviewed: Lab Summary Latest Ref Rng & Units 02/11/2016 11/12/2015 06/01/2015  Hemoglobin 13.2 - 17.1 g/dL (None) 15.6 (None)  Hematocrit 38.5 - 50.0 %  (None) 46.5 (None)  White count 3.8 - 10.8 K/uL (None) 8.5 (None)  Platelet count 140 - 400 K/uL (None) 250 (None)  Sodium 135 - 146 mmol/L (None) 139 140  Potassium 3.5 - 5.3 mmol/L (None) 4.3 4.3  Calcium 8.6 - 10.3 mg/dL (None) 9.3 9.7  Phosphorus - (None) (None) (None)  Creatinine > or = 20.0 mg/dL 164.0 0.89 0.84  AST 10 - 40 U/L (None) 23 (None)  Alk Phos 40 - 115 U/L (None) 90 (None)  Bilirubin 0.2 - 1.2 mg/dL (None) 0.5 (None)  Glucose 65 - 99 mg/dL (None) 102(H) 124(H)  Cholesterol 125 - 200 mg/dL (None) 204(H) (None)  HDL cholesterol >=40 mg/dL (None) 35(L) (None)  Triglycerides <150 mg/dL (None) 261(H) (None)  LDL Direct - (None) (None) (None)  LDL Calc <130 mg/dL (None) 117 (None)  Total protein 6.1 - 8.1 g/dL (None) 6.7 (None)  Albumin 3.6 - 5.1 g/dL (None) 4.1 (None)  Some recent data might be hidden   Lab Results  Component Value Date   TSH 0.875 05/28/2012   Lab Results  Component Value Date   BUN 14 11/12/2015   BUN 18 06/01/2015   BUN 16 10/14/2014   Lab Results  Component Value Date   HGBA1C 5.5 12/08/2015   HGBA1C 5.6 10/14/2014    Assessment/Plan 1. Chronic left-sided low back pain, with sciatica presence unspecified The current medical regimen is effective;  continue present plan and medications.  2. Chronic right shoulder pain - Ambulatory referral to Orthopedic Surgery  3. Hyperactive I don't think he meets   4. Sprain of right rotator cuff capsule, subsequent encounter - Ambulatory referral to Orthopedic Surgery  5. Tobacco use disorder Stop smoking. He is not ready.  6. Gastroesophageal reflux disease with esophagitis Try Pepcis complete  7. DM type 2, uncontrolled, with neuropathy ([facility]) - metFORMIN (GLUCOPHAGE) 500 MG tablet; Take one with breakfast each morning to help control blood sugar  Dispense: 90 tablet; Refill: 4 - Hemoglobin A1c; Future - Comprehensive metabolic panel; Future

## 2016-04-08 ENCOUNTER — Telehealth: Payer: Self-pay

## 2016-04-08 ENCOUNTER — Other Ambulatory Visit: Payer: Self-pay

## 2016-04-08 DIAGNOSIS — G894 Chronic pain syndrome: Secondary | ICD-10-CM

## 2016-04-08 MED ORDER — OXYCODONE HCL 30 MG PO TABS: ORAL_TABLET | ORAL | 0 refills | Status: DC

## 2016-04-08 NOTE — Telephone Encounter (Signed)
Medication refills for Oxycodone 30 mg and Oxycodone IR 30 mg. Rx were printed and placed in Dr. Ernest Mallickeed's folder for signing. Patient will be called once Rx have been signed.

## 2016-04-08 NOTE — Telephone Encounter (Signed)
Patient called to say that he was given 2 of the same prescription instead of oxy 30mg  12 hr he was given 2 oxy 30 mg IR. He will bring the incorrect Rx back on Monday and pick up the correct one.

## 2016-04-11 ENCOUNTER — Other Ambulatory Visit: Payer: Self-pay

## 2016-04-11 DIAGNOSIS — G894 Chronic pain syndrome: Secondary | ICD-10-CM

## 2016-04-11 MED ORDER — OXYCODONE HCL ER 30 MG PO T12A: EXTENDED_RELEASE_TABLET | ORAL | 0 refills | Status: DC

## 2016-04-11 NOTE — Telephone Encounter (Signed)
Rx printed and placed in Dr. Ernest Mallickeed's folder for signing.

## 2016-04-18 ENCOUNTER — Ambulatory Visit (INDEPENDENT_AMBULATORY_CARE_PROVIDER_SITE_OTHER): Payer: BLUE CROSS/BLUE SHIELD | Admitting: Orthopedic Surgery

## 2016-04-20 ENCOUNTER — Other Ambulatory Visit: Payer: Self-pay | Admitting: Internal Medicine

## 2016-04-22 ENCOUNTER — Other Ambulatory Visit: Payer: Self-pay | Admitting: Internal Medicine

## 2016-04-25 ENCOUNTER — Telehealth: Payer: Self-pay | Admitting: *Deleted

## 2016-04-25 MED ORDER — TESTOSTERONE 30 MG/ACT TD SOLN: TRANSDERMAL | 0 refills | Status: DC

## 2016-04-25 NOTE — Telephone Encounter (Signed)
Taylor Kidd with [facility] called and stated that they received a Rx for Axiron 5ml and it only comes in 90ml bottles and needs it changed. Called [facility] [phone] and changed to 90ml.

## 2016-05-02 ENCOUNTER — Telehealth: Payer: Self-pay | Admitting: *Deleted
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
## 2016-05-02 NOTE — Telephone Encounter (Signed)
Please defer to Dr Chilton SiGreen, this is his patient

## 2016-05-02 NOTE — Telephone Encounter (Signed)
I will sign a prescription for an early refill tomorrow. Please print it out in the morning.

## 2016-05-02 NOTE — Telephone Encounter (Signed)
That is OK

## 2016-05-02 NOTE — Telephone Encounter (Signed)
Patient notified and stated that it is for Oxycodone 30 IR #240 not the Oxy 30 twice daily. He had the Immediate release in the bottle with the 12 hr tablets. Is it ok to print the Oxycodone 30mg  Immediate release #240 instead.

## 2016-05-02 NOTE — Telephone Encounter (Signed)
Will print Rx 05/03/2016 for Dr. Chilton SiGreen to review and sign. Patient notified we will call him once it is signed, he agreed.

## 2016-05-03 ENCOUNTER — Other Ambulatory Visit: Payer: Self-pay | Admitting: *Deleted

## 2016-05-03 DIAGNOSIS — G894 Chronic pain syndrome: Secondary | ICD-10-CM

## 2016-05-03 MED ORDER — OXYCODONE HCL 30 MG PO TABS: ORAL_TABLET | ORAL | 0 refills | Status: DC

## 2016-05-03 NOTE — Telephone Encounter (Signed)
See Telephone message dated 05/02/2016. Printed early per Dr. Chilton SiGreen. Patient will pick up.

## 2016-05-03 NOTE — Telephone Encounter (Signed)
I spoke with patient to let him know that he has a prescription for oxycodone 30 mg IR, #240  Rx was placed in filing cabinet at front desk.

## 2016-05-04 NOTE — Telephone Encounter (Signed)
Fax received [facility] early RF. LR 04/08/16. Ok to fill early per Dr. Chilton SiGreen. Spoke with Irving BurtonEmily at [facility] [phone]

## 2016-05-09 ENCOUNTER — Other Ambulatory Visit: Payer: Self-pay | Admitting: *Deleted

## 2016-05-09 DIAGNOSIS — M545 Low back pain: Secondary | ICD-10-CM

## 2016-05-09 DIAGNOSIS — G894 Chronic pain syndrome: Secondary | ICD-10-CM

## 2016-05-09 DIAGNOSIS — G8929 Other chronic pain: Secondary | ICD-10-CM

## 2016-05-09 MED ORDER — OXYCODONE HCL ER 30 MG PO T12A: EXTENDED_RELEASE_TABLET | ORAL | 0 refills | Status: DC

## 2016-05-09 MED ORDER — ALPRAZOLAM 0.5 MG PO TABS: ORAL_TABLET | ORAL | 0 refills | Status: DC

## 2016-05-09 NOTE — Telephone Encounter (Signed)
Patient requested and will pick up 

## 2016-05-25 ENCOUNTER — Other Ambulatory Visit: Payer: Self-pay | Admitting: Nurse Practitioner

## 2016-05-31 ENCOUNTER — Ambulatory Visit (INDEPENDENT_AMBULATORY_CARE_PROVIDER_SITE_OTHER): Payer: BLUE CROSS/BLUE SHIELD | Admitting: Nurse Practitioner

## 2016-05-31 ENCOUNTER — Encounter: Payer: Self-pay | Admitting: Nurse Practitioner

## 2016-05-31 VITALS — BP 118/78 | HR 89 | Temp 98.5°F | Resp 18 | Ht 75.0 in | Wt 242.2 lb

## 2016-05-31 DIAGNOSIS — G8929 Other chronic pain: Secondary | ICD-10-CM | POA: Diagnosis not present

## 2016-05-31 DIAGNOSIS — M25511 Pain in right shoulder: Secondary | ICD-10-CM

## 2016-05-31 MED ORDER — NAPROXEN 250 MG PO TABS: 250.0000 mg | ORAL_TABLET | Freq: Two times a day (BID) | ORAL | 1 refills | Status: DC

## 2016-05-31 NOTE — Progress Notes (Signed)
Careteam: Patient Care Team: Kimber Relic, MD as PCP - General (Internal Medicine) Sheran Luz, MD as Consulting Physician (Physical Medicine and Rehabilitation) Pati Gallo, MD as Consulting Physician (Sports Medicine)  Advanced Directive information Does Patient Have a Medical Advance Directive?: No, Would patient like information on creating a medical advance directive?: Yes (MAU/Ambulatory/Procedural Areas - Information given)  No Known Allergies  Chief Complaint  Patient presents with  . Acute Visit    Right shoulder pain     HPI: Patient is a 46 y.o. male seen in the office today for ongoing right  chronic shoulder pain. He has been seen several times for this pain in the past and is on chronic opioid therapy for this. He states that his shoulder pain began in 2005 and has been ongoing, however has recently worsened. He attributes this to overuse and repetition. He works as a Civil Service fast streamer for [employer]. He was referred to Ortho surgery in Feb of this year, but never followed up with them. He has had an MRI in the past which showed tendinopathy. This is most likely worsening at this point. He states that his pain is typically a 6 out of 10 in severity, located in the anterior portion of his shoulder joint. He is able to lift, internal and externally rotate his arm with pain, but no limitations in his strength or ROM. Denies recent trauma, cervical or spine surgeries. Denies numbness or tingling. No other contributory history. Denies blood in stool or urine.    Review of Systems:  Review of Systems  Constitutional: Negative for activity change.  Respiratory: Negative for chest tightness, shortness of breath and wheezing.   Cardiovascular: Negative for chest pain, palpitations and leg swelling.  Gastrointestinal: Negative for blood in stool.  Genitourinary: Negative for hematuria.  Musculoskeletal: Positive for back pain. Negative for joint swelling, myalgias and neck  stiffness.       Right shoulder pain.     Past Medical History:  Diagnosis Date  . Heartburn   . Hematuria, unspecified   . Lumbago   . Other abnormal blood chemistry   . Other malaise and fatigue   . Palpitations   . Unspecified essential hypertension   . Unspecified vitamin D deficiency    History reviewed. No pertinent surgical history. Social History:   reports that he has been smoking Cigarettes.  He has been smoking about 0.50 packs per day. He has never used smokeless tobacco. He reports that he drinks alcohol. He reports that he does not use drugs.  History reviewed. No pertinent family history.  Medications: Patient's Medications  New Prescriptions   No medications on file  Previous Medications   ALPRAZOLAM (XANAX) 0.5 MG TABLET    Take one tablet by mouth twice daily as needed for anxiety   CHOLECALCIFEROL (VITAMIN D3) 5000 UNITS CAPS    Take one tablet by mouth Monday, Wednesday and Friday for Vitamin D Supplement.   METFORMIN (GLUCOPHAGE) 500 MG TABLET    Take one with breakfast each morning to help control blood sugar   OXYCODONE (ROXICODONE) 30 MG IMMEDIATE RELEASE TABLET    Take one tablet every 3 hours daily as needed for pain   OXYCODONE 30 MG 12 HR TABLET    One every 12 hours to relieve pain   TESTOSTERONE 30 MG/ACT SOLN    APPLY 1 PUMP UNDER THE ARM DAILY  Modified Medications   No medications on file  Discontinued Medications   No medications on file  Physical Exam:  Vitals:   05/31/16 0851  BP: 118/78  Pulse: 89  Resp: 18  Temp: 98.5 F (36.9 C)  TempSrc: Oral  SpO2: 97%  Weight: 242 lb 3.2 oz (109.9 kg)  Height:  (1.905 m)   Body mass index is 30.27 kg/m.  Physical Exam  Constitutional: He appears well-developed and well-nourished. No distress.  HENT:  Head: Normocephalic and atraumatic.  Eyes: Conjunctivae and EOM are normal. Pupils are equal, round, and reactive to light. Right eye exhibits no discharge. Left eye exhibits no  discharge.  Neck: Normal range of motion.  Cardiovascular: Normal rate and normal heart sounds.   No murmur heard. Pulmonary/Chest: Effort normal and breath sounds normal. No respiratory distress.  Musculoskeletal:       Right shoulder: He exhibits tenderness and pain. He exhibits normal range of motion, no bony tenderness, no swelling, no crepitus and normal strength.  Negative drop arm test and empty can test. 5/5 strength, Normal ROM    Skin: He is not diaphoretic.   Labs reviewed: Basic Metabolic Panel:  Recent Labs  40/98/11 0949  NA 139  K 4.3  CL 102  CO2 27  GLUCOSE 102*  BUN 14  CREATININE 0.89  CALCIUM 9.3   Liver Function Tests:  Recent Labs  11/12/15 0949  AST 23  ALT 26  ALKPHOS 90  BILITOT 0.5  PROT 6.7  ALBUMIN 4.1   No results for input(s): LIPASE, AMYLASE in the last 8760 hours. No results for input(s): AMMONIA in the last 8760 hours. CBC:  Recent Labs  11/12/15 0949  WBC 8.5  NEUTROABS 6,035  HGB 15.6  HCT 46.5  MCV 78.4*  PLT 250   Lipid Panel:  Recent Labs  11/12/15 0949  CHOL 204*  HDL 35*  LDLCALC 117  TRIG 261*  CHOLHDL 5.8*   TSH: No results for input(s): TSH in the last 8760 hours. A1C: Lab Results  Component Value Date   HGBA1C 5.5 12/08/2015     Assessment/Plan 1. Chronic right shoulder pain -Educated patient regarding decreasing his pain medications.  -Will add Naproxen to help with inflammation.  -Refer to OT for evaluation -Urine drug screen completed today, asked for early refill but will wait until time. -May benefit from pain clinic referral as well -Pt is to follow with Ortho as well for possible MRI and follow up care.   Janene Harvey. Biagio Borg  [facility] [phone] 8 am - 5 pm) [phone] (after hours)

## 2016-05-31 NOTE — Patient Instructions (Signed)
To use naproxen 250 mg by mouth twice daily with meals.  To minimize narcotic use Will get OT referral at this time

## 2016-06-01 ENCOUNTER — Telehealth: Payer: Self-pay | Admitting: *Deleted

## 2016-06-01 NOTE — Telephone Encounter (Signed)
Patient called and left message on Clinical Intake requesting Refill on his pain medication Oxycodone.  [name] saw patient yesterday and informed patient that his Rx wasn't due until Thursday. I also called patient back and explained that it would be 30 days on Thursday. He stated he was out today. I apologized and informed him that we would print his Rx tomorrow and call him when ready for pick up.

## 2016-06-02 ENCOUNTER — Other Ambulatory Visit: Payer: Self-pay

## 2016-06-02 DIAGNOSIS — G894 Chronic pain syndrome: Secondary | ICD-10-CM

## 2016-06-02 MED ORDER — OXYCODONE HCL 30 MG PO TABS: ORAL_TABLET | ORAL | 0 refills | Status: DC

## 2016-06-06 ENCOUNTER — Other Ambulatory Visit: Payer: Self-pay | Admitting: Internal Medicine

## 2016-06-06 DIAGNOSIS — G8929 Other chronic pain: Secondary | ICD-10-CM

## 2016-06-06 DIAGNOSIS — M545 Low back pain: Principal | ICD-10-CM

## 2016-06-06 LAB — DRUG TOX MONITOR 1 W/CONF, ORAL FLD
Amphetamines: NEGATIVE ng/mL (ref <10)
BARBITURATES: NEGATIVE ng/mL (ref <10)
BUPRENORPHINE: NEGATIVE ng/mL (ref <0.025)
Benzodiazepines: NEGATIVE ng/mL (ref <0.50)
COCAINE: NEGATIVE ng/mL (ref <2.5)
COTININE: 238.3 ng/mL — AB (ref <5.0)
Codeine: NEGATIVE ng/mL (ref <2.5)
DIHYDROCODEINE: NEGATIVE ng/mL (ref <2.5)
FENTANYL: NEGATIVE ng/mL (ref <0.10)
Heroin Metabolite: NEGATIVE ng/mL (ref <1.0)
Hydrocodone: NEGATIVE ng/mL (ref <2.5)
Hydromorphone: NEGATIVE ng/mL (ref <2.5)
MARIJUANA: NEGATIVE ng/mL (ref <2.5)
MDMA: NEGATIVE ng/mL (ref <10)
MEPERIDINE: NEGATIVE ng/mL (ref <5.0)
MEPROBAMATE: NEGATIVE ng/mL (ref <2.5)
METHADONE: NEGATIVE ng/mL (ref <5.0)
MORPHINE: NEGATIVE ng/mL (ref <2.5)
NICOTINE METABOLITE: POSITIVE ng/mL — AB (ref <5.0)
NORBUPRENORPHINE: NEGATIVE ng/mL (ref <0.25)
NORHYDROCODONE: NEGATIVE ng/mL (ref <2.5)
Noroxycodone: >250 ng/mL — ABNORMAL HIGH (ref <2.5)
OPIATES: POSITIVE ng/mL — AB (ref <2.5)
OXYMORPHONE: 38.8 ng/mL — AB (ref <2.5)
PHENCYCLIDINE: NEGATIVE ng/mL (ref <10)
PROPOXYPHENE: NEGATIVE ng/mL (ref <5.0)
TRAMADOL: NEGATIVE ng/mL (ref <5.0)
Tapentadol: NEGATIVE ng/mL (ref <5.0)
ZOLPIDEM: NEGATIVE ng/mL (ref <5.0)

## 2016-06-07 ENCOUNTER — Other Ambulatory Visit: Payer: Self-pay | Admitting: Internal Medicine

## 2016-06-07 DIAGNOSIS — M545 Low back pain: Principal | ICD-10-CM

## 2016-06-07 DIAGNOSIS — G8929 Other chronic pain: Secondary | ICD-10-CM

## 2016-06-08 ENCOUNTER — Other Ambulatory Visit: Payer: Self-pay | Admitting: Internal Medicine

## 2016-06-08 ENCOUNTER — Other Ambulatory Visit: Payer: Self-pay | Admitting: *Deleted

## 2016-06-08 DIAGNOSIS — G894 Chronic pain syndrome: Secondary | ICD-10-CM

## 2016-06-08 DIAGNOSIS — G8929 Other chronic pain: Secondary | ICD-10-CM

## 2016-06-08 DIAGNOSIS — M545 Low back pain: Principal | ICD-10-CM

## 2016-06-08 MED ORDER — OXYCODONE HCL ER 30 MG PO T12A: EXTENDED_RELEASE_TABLET | ORAL | 0 refills | Status: DC

## 2016-06-08 NOTE — Telephone Encounter (Signed)
Patient requested and will pick up 

## 2016-06-23 ENCOUNTER — Other Ambulatory Visit: Payer: Self-pay | Admitting: Internal Medicine

## 2016-06-23 NOTE — Telephone Encounter (Signed)
Recheck at next appt

## 2016-06-23 NOTE — Telephone Encounter (Signed)
Last testosterone level was abnormal 194, ref range 250-827 on 11/12/15.  Please indicate how often this is to be rechecked

## 2016-06-24 ENCOUNTER — Other Ambulatory Visit: Payer: Self-pay

## 2016-06-24 DIAGNOSIS — R7989 Other specified abnormal findings of blood chemistry: Secondary | ICD-10-CM

## 2016-06-28 ENCOUNTER — Ambulatory Visit: Payer: BLUE CROSS/BLUE SHIELD | Attending: Nurse Practitioner | Admitting: Occupational Therapy

## 2016-07-01 ENCOUNTER — Telehealth: Payer: Self-pay

## 2016-07-01 ENCOUNTER — Other Ambulatory Visit: Payer: Self-pay

## 2016-07-01 DIAGNOSIS — G894 Chronic pain syndrome: Secondary | ICD-10-CM

## 2016-07-01 MED ORDER — OXYCODONE HCL 30 MG PO TABS: ORAL_TABLET | ORAL | 0 refills | Status: DC

## 2016-07-01 NOTE — Telephone Encounter (Signed)
Called Mr. Haymaker and left a message letting him know this time we would allow Mr. Caroleen HammanRumley to pick up his medication. I also informed  him that next time he wanted Mr. Caroleen HammanRumley to pick up medications, he would need to fill out a new controlled substance contract.

## 2016-07-07 ENCOUNTER — Other Ambulatory Visit: Payer: Self-pay

## 2016-07-07 DIAGNOSIS — G894 Chronic pain syndrome: Secondary | ICD-10-CM

## 2016-07-07 MED ORDER — OXYCODONE HCL ER 30 MG PO T12A: EXTENDED_RELEASE_TABLET | ORAL | 0 refills | Status: DC

## 2016-07-29 ENCOUNTER — Encounter: Payer: Self-pay | Admitting: Internal Medicine

## 2016-07-29 ENCOUNTER — Other Ambulatory Visit: Payer: Self-pay

## 2016-07-29 DIAGNOSIS — G894 Chronic pain syndrome: Secondary | ICD-10-CM

## 2016-07-29 MED ORDER — OXYCODONE HCL 30 MG PO TABS: ORAL_TABLET | ORAL | 0 refills | Status: DC

## 2016-08-04 ENCOUNTER — Other Ambulatory Visit: Payer: Self-pay | Admitting: Internal Medicine

## 2016-08-05 ENCOUNTER — Other Ambulatory Visit: Payer: Self-pay | Admitting: *Deleted

## 2016-08-05 DIAGNOSIS — M545 Low back pain: Principal | ICD-10-CM

## 2016-08-05 DIAGNOSIS — G8929 Other chronic pain: Secondary | ICD-10-CM

## 2016-08-05 DIAGNOSIS — G894 Chronic pain syndrome: Secondary | ICD-10-CM

## 2016-08-05 MED ORDER — OXYCODONE HCL ER 30 MG PO T12A: EXTENDED_RELEASE_TABLET | ORAL | 0 refills | Status: DC

## 2016-08-05 MED ORDER — ALPRAZOLAM 0.5 MG PO TABS: ORAL_TABLET | ORAL | 0 refills | Status: DC

## 2016-08-05 NOTE — Telephone Encounter (Signed)
Patient requested and will pick up 

## 2016-08-15 ENCOUNTER — Ambulatory Visit (INDEPENDENT_AMBULATORY_CARE_PROVIDER_SITE_OTHER): Payer: BLUE CROSS/BLUE SHIELD | Admitting: Nurse Practitioner

## 2016-08-15 ENCOUNTER — Encounter: Payer: Self-pay | Admitting: Nurse Practitioner

## 2016-08-15 VITALS — BP 138/88 | HR 84 | Temp 98.3°F | Resp 18 | Ht 75.0 in | Wt 254.0 lb

## 2016-08-15 DIAGNOSIS — F419 Anxiety disorder, unspecified: Secondary | ICD-10-CM | POA: Diagnosis not present

## 2016-08-15 DIAGNOSIS — M545 Low back pain: Secondary | ICD-10-CM

## 2016-08-15 DIAGNOSIS — G8929 Other chronic pain: Secondary | ICD-10-CM | POA: Diagnosis not present

## 2016-08-15 DIAGNOSIS — G4700 Insomnia, unspecified: Secondary | ICD-10-CM

## 2016-08-15 MED ORDER — TRAZODONE HCL 50 MG PO TABS: 25.0000 mg | ORAL_TABLET | Freq: Every evening | ORAL | 3 refills | Status: DC | PRN

## 2016-08-15 MED ORDER — ALPRAZOLAM 0.25 MG PO TABS: ORAL_TABLET | ORAL | Status: DC

## 2016-08-15 NOTE — Progress Notes (Signed)
Careteam: Patient Care Team: Kimber RelicGreen, Arthur G, MD as PCP - General (Internal Medicine) Sheran Luzamos, Richard, MD as Consulting Physician (Physical Medicine and Rehabilitation) Pati GalloKramer, James, MD as Consulting Physician (Sports Medicine)  Advanced Directive information    No Known Allergies  Chief Complaint  Patient presents with  . Acute Visit    Pt is here to have DOT paperwork complete due to questions over oxycontin and oxycodone.      HPI: Patient is a 46 y.o. male seen in the office today due to needing paperwork signed.  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 Pt has been on these medications since 2003 and at this dose since 05/2014. Does need to take oxycodone every 3 hours. 6 am, 9am, 12pm, 3 pm, 6 pm, 9 pm, midnight.  From 12 pm to 6 pm he is in the most pain. He is taking medication for chronic left-sided back pain with sciatica.  He has not had any episodes of being impaired or had an accident while on the job.  No side effects from medications.   Also has hx of anxiety, does not use xanax during the day. Takes occasionally for sleep. Helps him to sleep and calm his nerves at night.  Also uses melatonin to help him sleep at times.   Review of Systems:  Review of Systems  Constitutional: Negative for chills, fever and weight loss.  Respiratory: Negative for shortness of breath.   Cardiovascular: Negative for chest pain, palpitations and leg swelling.  Gastrointestinal: Positive for constipation (controlled with colace). Negative for abdominal pain, diarrhea and heartburn.  Musculoskeletal: Positive for back pain. Negative for falls, joint pain and myalgias.  Skin: Negative.   Neurological: Negative for dizziness, tremors, sensory change, loss of consciousness, weakness and headaches.  Psychiatric/Behavioral: Negative for depression and memory loss. The patient is nervous/anxious (has improved) and has  insomnia.     Past Medical History:  Diagnosis Date  . Heartburn   . Hematuria, unspecified   . Lumbago   . Other abnormal blood chemistry   . Other malaise and fatigue   . Palpitations   . Unspecified essential hypertension   . Unspecified vitamin D deficiency    No past surgical history on file. Social History:   reports that he has been smoking Cigarettes.  He has been smoking about 0.50 packs per day. He has never used smokeless tobacco. He reports that he drinks alcohol. He reports that he does not use drugs.  No family history on file.  Medications: Patient's Medications  New Prescriptions   No medications on file  Previous Medications   ALPRAZOLAM (XANAX) 0.5 MG TABLET    Take one tablet by mouth twice daily as needed for anxiety   CHOLECALCIFEROL (VITAMIN D3) 5000 UNITS CAPS    Take one tablet by mouth Monday, Wednesday and Friday for Vitamin D Supplement.   METFORMIN (GLUCOPHAGE) 500 MG TABLET    Take one with breakfast each morning to help control blood sugar   NAPROXEN (NAPROSYN) 250 MG TABLET    Take 1 tablet (250 mg total) by mouth 2 (two) times daily with a meal.   OXYCODONE (ROXICODONE) 30 MG IMMEDIATE RELEASE TABLET    Take one tablet every 3 hours daily as needed for pain   OXYCODONE 30 MG 12 HR TABLET    Take One tablet by mouth every 12 hours to relieve pain   TESTOSTERONE 30 MG/ACT SOLN    (  60 DAYS APR 4) APPLY 1 PUMP UNDER THE ARM DAILY  Modified Medications   No medications on file  Discontinued Medications   No medications on file     Physical Exam:  Vitals:   08/15/16 0850  BP: 138/88  Pulse: 84  Resp: 18  Temp: 98.3 F (36.8 C)  TempSrc: Oral  SpO2: 96%  Weight: 254 lb (115.2 kg)  Height: 6\' 3"  (1.905 m)   Body mass index is 31.75 kg/m.  Physical Exam  Constitutional: He is oriented to person, place, and time. He appears well-developed and well-nourished. No distress.  HENT:  Right Ear: External ear normal.  Nose: Nose normal.    Mouth/Throat: No oropharyngeal exudate.  Eyes: Conjunctivae and EOM are normal. Pupils are equal, round, and reactive to light.  Cardiovascular: Normal rate, regular rhythm and normal heart sounds.  Exam reveals no gallop and no friction rub.   No murmur heard. Pulmonary/Chest: No respiratory distress. He has no wheezes. He has no rales. He exhibits no tenderness.  Musculoskeletal: Normal range of motion. He exhibits no edema or tenderness.  Neurological: He is alert and oriented to person, place, and time. He has normal reflexes. No cranial nerve deficit. Coordination normal.  Skin: Skin is warm and dry.  Psychiatric: He has a normal mood and affect. His behavior is normal. Judgment and thought content normal.    Labs reviewed: Basic Metabolic Panel:  Recent Labs  16/10/96 0949  NA 139  K 4.3  CL 102  CO2 27  GLUCOSE 102*  BUN 14  CREATININE 0.89  CALCIUM 9.3   Liver Function Tests:  Recent Labs  11/12/15 0949  AST 23  ALT 26  ALKPHOS 90  BILITOT 0.5  PROT 6.7  ALBUMIN 4.1   No results for input(s): LIPASE, AMYLASE in the last 8760 hours. No results for input(s): AMMONIA in the last 8760 hours. CBC:  Recent Labs  11/12/15 0949  WBC 8.5  NEUTROABS 6,035  HGB 15.6  HCT 46.5  MCV 78.4*  PLT 250   Lipid Panel:  Recent Labs  11/12/15 0949  CHOL 204*  HDL 35*  LDLCALC 117  TRIG 261*  CHOLHDL 5.8*   TSH: No results for input(s): TSH in the last 8760 hours. A1C: Lab Results  Component Value Date   HGBA1C 5.5 12/08/2015    PAST PROCEDURES 02/11/2002 MRI Lumbar Spine, single level pathology at L5-S1 with a moderate sized broad based disc herniation which indents. The ventral aspect of the thecal sac and could affect either S1 nerve root. 02/11/2002 MRI Thoracic Spine, small central to right sided disc herniation at T7-8 of questionable significance.Minimal right sided spondylosis at T3-4 of questionable significance.  Assessment/Plan 1. Chronic  left-sided low back pain, with sciatica presence unspecified Signed form for DOT, this form states that he is able to complete task required on his current pain regimen.   2. Insomnia, unspecified type - traZODone (DESYREL) 50 MG tablet; Take 0.5-1 tablets (25-50 mg total) by mouth at bedtime as needed for sleep.  Dispense: 30 tablet; Refill: 3  3. Anxiety -improved overall. Takes xanax occasionally for sleep due to increase anxiety. Will use trazodone first but if needed will use reduced dose of xanax. Hopefully we can DC xanax all together if trazodone effective.  - ALPRAZolam (XANAX) 0.25 MG tablet; Take one tablet by mouth at bedtime as needed for anxiety  Dispense: 30 tablet  Aamna Mallozzi K. Biagio Borg  [facility] [phone] 8  am - 5 pm) [phone] (after hours)

## 2016-08-15 NOTE — Patient Instructions (Addendum)
To use trazodone 25-50 mg as needed for sleep first.  Minimize xanax at night, can take 1/2 tablet as needed

## 2016-08-26 ENCOUNTER — Other Ambulatory Visit: Payer: Self-pay

## 2016-08-26 DIAGNOSIS — G894 Chronic pain syndrome: Secondary | ICD-10-CM

## 2016-08-26 MED ORDER — OXYCODONE HCL 30 MG PO TABS: ORAL_TABLET | ORAL | 0 refills | Status: DC

## 2016-08-30 ENCOUNTER — Other Ambulatory Visit: Payer: Self-pay | Admitting: Nurse Practitioner

## 2016-08-30 DIAGNOSIS — M25511 Pain in right shoulder: Principal | ICD-10-CM

## 2016-08-30 DIAGNOSIS — G8929 Other chronic pain: Secondary | ICD-10-CM

## 2016-09-02 ENCOUNTER — Other Ambulatory Visit: Payer: Self-pay | Admitting: *Deleted

## 2016-09-02 DIAGNOSIS — G894 Chronic pain syndrome: Secondary | ICD-10-CM

## 2016-09-02 MED ORDER — OXYCODONE HCL ER 30 MG PO T12A: EXTENDED_RELEASE_TABLET | ORAL | 0 refills | Status: DC

## 2016-09-02 MED ORDER — TESTOSTERONE 30 MG/ACT TD SOLN: TRANSDERMAL | 0 refills | Status: DC

## 2016-09-02 NOTE — Telephone Encounter (Signed)
Patient requested and will pick up 

## 2016-09-07 ENCOUNTER — Other Ambulatory Visit: Payer: Self-pay

## 2016-09-07 DIAGNOSIS — E1140 Type 2 diabetes mellitus with diabetic neuropathy, unspecified: Secondary | ICD-10-CM

## 2016-09-07 DIAGNOSIS — IMO0002 Reserved for concepts with insufficient information to code with codable children: Secondary | ICD-10-CM

## 2016-09-07 DIAGNOSIS — E1165 Type 2 diabetes mellitus with hyperglycemia: Principal | ICD-10-CM

## 2016-09-07 DIAGNOSIS — R7989 Other specified abnormal findings of blood chemistry: Secondary | ICD-10-CM

## 2016-09-23 ENCOUNTER — Other Ambulatory Visit: Payer: Self-pay | Admitting: *Deleted

## 2016-09-23 ENCOUNTER — Other Ambulatory Visit: Payer: BLUE CROSS/BLUE SHIELD

## 2016-09-23 DIAGNOSIS — G894 Chronic pain syndrome: Secondary | ICD-10-CM

## 2016-09-23 DIAGNOSIS — E1165 Type 2 diabetes mellitus with hyperglycemia: Principal | ICD-10-CM

## 2016-09-23 DIAGNOSIS — IMO0002 Reserved for concepts with insufficient information to code with codable children: Secondary | ICD-10-CM

## 2016-09-23 DIAGNOSIS — F419 Anxiety disorder, unspecified: Secondary | ICD-10-CM

## 2016-09-23 DIAGNOSIS — E1140 Type 2 diabetes mellitus with diabetic neuropathy, unspecified: Secondary | ICD-10-CM

## 2016-09-23 DIAGNOSIS — R7989 Other specified abnormal findings of blood chemistry: Secondary | ICD-10-CM

## 2016-09-23 LAB — COMPLETE METABOLIC PANEL WITH GFR
ALT: 24 U/L (ref 9–46)
AST: 26 U/L (ref 10–40)
Albumin: 3.9 g/dL (ref 3.6–5.1)
Alkaline Phosphatase: 75 U/L (ref 40–115)
BUN: 13 mg/dL (ref 7–25)
CHLORIDE: 100 mmol/L (ref 98–110)
CO2: 24 mmol/L (ref 20–31)
Calcium: 9.3 mg/dL (ref 8.6–10.3)
Creat: 0.86 mg/dL (ref 0.60–1.35)
GFR, Est African American: >89 mL/min (ref >=60)
GFR, Est Non African American: >89 mL/min (ref >=60)
GLUCOSE: 102 mg/dL — AB (ref 65–99)
POTASSIUM: 4 mmol/L (ref 3.5–5.3)
SODIUM: 137 mmol/L (ref 135–146)
Total Bilirubin: 0.4 mg/dL (ref 0.2–1.2)
Total Protein: 6.5 g/dL (ref 6.1–8.1)

## 2016-09-23 MED ORDER — ALPRAZOLAM 0.25 MG PO TABS
ORAL_TABLET | ORAL | 0 refills | Status: DC
Start: 2016-09-23 — End: 2016-10-21

## 2016-09-23 MED ORDER — OXYCODONE HCL 30 MG PO TABS: ORAL_TABLET | ORAL | 0 refills | Status: DC

## 2016-09-23 NOTE — Telephone Encounter (Signed)
Patient requested and will pick up 

## 2016-09-24 LAB — TESTOSTERONE: Testosterone: 1381 ng/dL — ABNORMAL HIGH (ref 250–827)

## 2016-09-24 LAB — HEMOGLOBIN A1C
Hgb A1c MFr Bld: 5.2 % (ref <5.7)
Mean Plasma Glucose: 103 mg/dL

## 2016-09-27 ENCOUNTER — Ambulatory Visit: Payer: BLUE CROSS/BLUE SHIELD | Admitting: Nurse Practitioner

## 2016-09-30 ENCOUNTER — Other Ambulatory Visit: Payer: Self-pay | Admitting: *Deleted

## 2016-09-30 DIAGNOSIS — G894 Chronic pain syndrome: Secondary | ICD-10-CM

## 2016-09-30 MED ORDER — TESTOSTERONE 30 MG/ACT TD SOLN: TRANSDERMAL | 0 refills | Status: DC

## 2016-09-30 MED ORDER — OXYCODONE HCL ER 30 MG PO T12A: EXTENDED_RELEASE_TABLET | ORAL | 0 refills | Status: DC

## 2016-10-07 ENCOUNTER — Ambulatory Visit: Payer: Self-pay | Admitting: Nurse Practitioner

## 2016-10-17 ENCOUNTER — Other Ambulatory Visit: Payer: Self-pay | Admitting: Nurse Practitioner

## 2016-10-18 MED ORDER — TESTOSTERONE 30 MG/ACT TD SOLN: TRANSDERMAL | 0 refills | Status: DC

## 2016-10-19 ENCOUNTER — Telehealth: Payer: Self-pay

## 2016-10-19 NOTE — Telephone Encounter (Signed)
Pt called stating that he needed to cancel the upcoming appointment on 10/21/16 due his employer not letting him off work. He stated that he could reschedule the appointment when he could get time off from work.   Pt has rescheduled appointment twice this month and had one no show.

## 2016-10-19 NOTE — Telephone Encounter (Signed)
Noted thank you

## 2016-10-21 ENCOUNTER — Ambulatory Visit: Payer: Self-pay | Admitting: Nurse Practitioner

## 2016-10-21 ENCOUNTER — Other Ambulatory Visit: Payer: Self-pay | Admitting: *Deleted

## 2016-10-21 DIAGNOSIS — F419 Anxiety disorder, unspecified: Secondary | ICD-10-CM

## 2016-10-21 DIAGNOSIS — G894 Chronic pain syndrome: Secondary | ICD-10-CM

## 2016-10-21 MED ORDER — ALPRAZOLAM 0.25 MG PO TABS
ORAL_TABLET | ORAL | 0 refills | Status: DC
Start: 2016-10-21 — End: 2016-11-25

## 2016-10-21 MED ORDER — OXYCODONE HCL 30 MG PO TABS: ORAL_TABLET | ORAL | 0 refills | Status: DC

## 2016-10-21 NOTE — Telephone Encounter (Signed)
Patient requested and will pick up [place] Database Checked.  

## 2016-10-28 ENCOUNTER — Other Ambulatory Visit: Payer: Self-pay | Admitting: *Deleted

## 2016-10-28 DIAGNOSIS — G894 Chronic pain syndrome: Secondary | ICD-10-CM

## 2016-10-28 MED ORDER — OXYCODONE HCL ER 30 MG PO T12A: EXTENDED_RELEASE_TABLET | ORAL | 0 refills | Status: DC

## 2016-10-28 NOTE — Telephone Encounter (Signed)
Patient requested and will pick up [place] Database Checked.  

## 2016-11-14 ENCOUNTER — Ambulatory Visit (INDEPENDENT_AMBULATORY_CARE_PROVIDER_SITE_OTHER): Payer: BLUE CROSS/BLUE SHIELD | Admitting: Nurse Practitioner

## 2016-11-14 ENCOUNTER — Encounter: Payer: Self-pay | Admitting: Nurse Practitioner

## 2016-11-14 VITALS — BP 140/82 | HR 97 | Temp 97.9°F | Resp 18 | Ht 75.0 in | Wt 247.2 lb

## 2016-11-14 DIAGNOSIS — L739 Follicular disorder, unspecified: Secondary | ICD-10-CM | POA: Diagnosis not present

## 2016-11-14 DIAGNOSIS — R7989 Other specified abnormal findings of blood chemistry: Secondary | ICD-10-CM | POA: Diagnosis not present

## 2016-11-14 DIAGNOSIS — G894 Chronic pain syndrome: Secondary | ICD-10-CM | POA: Diagnosis not present

## 2016-11-14 DIAGNOSIS — F419 Anxiety disorder, unspecified: Secondary | ICD-10-CM

## 2016-11-14 DIAGNOSIS — G4700 Insomnia, unspecified: Secondary | ICD-10-CM

## 2016-11-14 DIAGNOSIS — M25511 Pain in right shoulder: Secondary | ICD-10-CM

## 2016-11-14 DIAGNOSIS — E559 Vitamin D deficiency, unspecified: Secondary | ICD-10-CM | POA: Diagnosis not present

## 2016-11-14 LAB — CBC WITH DIFFERENTIAL/PLATELET
Basophils Absolute: 48 cells/uL (ref 0–200)
Basophils Relative: 0.4 %
EOS PCT: 1.1 %
Eosinophils Absolute: 131 cells/uL (ref 15–500)
HEMATOCRIT: 48.8 % (ref 38.5–50.0)
HEMOGLOBIN: 16.5 g/dL (ref 13.2–17.1)
LYMPHS ABS: 2225 {cells}/uL (ref 850–3900)
MCH: 26.9 pg — ABNORMAL LOW (ref 27.0–33.0)
MCHC: 33.8 g/dL (ref 32.0–36.0)
MCV: 79.6 fL — ABNORMAL LOW (ref 80.0–100.0)
MPV: 9.4 fL (ref 7.5–12.5)
Monocytes Relative: 5.6 %
NEUTROS ABS: 8830 {cells}/uL — AB (ref 1500–7800)
Neutrophils Relative %: 74.2 %
Platelets: 272 10*3/uL (ref 140–400)
RBC: 6.13 10*6/uL — AB (ref 4.20–5.80)
RDW: 13.2 % (ref 11.0–15.0)
Total Lymphocyte: 18.7 %
WBC mixed population: 666 cells/uL (ref 200–950)
WBC: 11.9 10*3/uL — AB (ref 3.8–10.8)

## 2016-11-14 MED ORDER — VITAMIN D3 125 MCG (5000 UT) PO CAPS: ORAL_CAPSULE | ORAL | 4 refills | Status: DC

## 2016-11-14 MED ORDER — DOXYCYCLINE HYCLATE 100 MG PO TABS: 100.0000 mg | ORAL_TABLET | Freq: Two times a day (BID) | ORAL | 0 refills | Status: DC

## 2016-11-14 MED ORDER — OXYCODONE HCL 30 MG PO TABS: ORAL_TABLET | ORAL | 0 refills | Status: DC

## 2016-11-14 NOTE — Progress Notes (Signed)
Careteam: Patient Care Team: Taylor Seller, NP as PCP - General (Geriatric Medicine) Taylor Luz, MD as Consulting Physician (Physical Medicine and Rehabilitation) Taylor Gallo, MD as Consulting Physician (Sports Medicine)  Advanced Directive information Does Patient Have a Medical Advance Directive?: No, Would patient like information on creating a medical advance directive?: Yes (MAU/Ambulatory/Procedural Areas - Information given)  No Known Allergies  Chief Complaint  Patient presents with  . Medical Management of Chronic Issues    Pt is being seen for a 7 month routine visit. Pt has red, itchy spots on both forearms  . Other    Wife, Taylor Kidd, in room     HPI: Patient is a 46 y.o. male seen in the office today for routine follow up. Pt with chronic pain of the low back, right shoulder, tobacco abuse, GERD, diabetes type 2.   DM- taking metformin 500 mg daily last A1c 5.2  Testosterone gel- taking double the dose. Clarification done.  Does not take daily. Uses OTC supplement.   Does not use trazodone for sleep using xanax for sleep.   Got hurt at work- taking naproxen which has been helping. Would like to cut back on pain medication. Wife here at visit and agrees he is dependant on medication.  Using oxycodone every 4 hours instead of every 3 in attempt to cut back.   Plans to go to orthopedic for shoulder and do PT.   Xanax- using 0.5 mg by mouth daily instead of twice daily.   Review of Systems:  Review of Systems  Constitutional: Negative for chills, fever and weight loss.  Respiratory: Negative for shortness of breath.   Cardiovascular: Negative for chest pain, palpitations and leg swelling.  Gastrointestinal: Positive for constipation (controlled with colace). Negative for abdominal pain, diarrhea and heartburn.  Musculoskeletal: Positive for back pain and joint pain (acute shoulder pain). Negative for falls and myalgias.  Skin: Positive for itching and  rash.  Neurological: Negative for dizziness, tremors, sensory change, loss of consciousness, weakness and headaches.  Psychiatric/Behavioral: Negative for depression and memory loss. The patient is nervous/anxious (has improved) and has insomnia (occasional).     Past Medical History:  Diagnosis Date  . Heartburn   . Hematuria, unspecified   . Lumbago   . Other abnormal blood chemistry   . Other malaise and fatigue   . Palpitations   . Unspecified essential hypertension   . Unspecified vitamin D deficiency    History reviewed. No pertinent surgical history. Social History:   reports that he has been smoking Cigarettes.  He has been smoking about 0.50 packs per day. He has never used smokeless tobacco. He reports that he drinks alcohol. He reports that he does not use drugs.  History reviewed. No pertinent family history.  Medications: Patient's Medications  New Prescriptions   No medications on file  Previous Medications   ALPRAZOLAM (XANAX) 0.25 MG TABLET    Take one tablet by mouth at bedtime as needed for anxiety   CHOLECALCIFEROL (VITAMIN D3) 5000 UNITS CAPS    Take one tablet by mouth Monday, Wednesday and Friday for Vitamin D Supplement.   METFORMIN (GLUCOPHAGE) 500 MG TABLET    Take one with breakfast each morning to help control blood sugar   NAPROXEN (NAPROSYN) 250 MG TABLET    TAKE 1 TABLET (250 MG TOTAL) BY MOUTH 2 (TWO) TIMES DAILY WITH A MEAL.   OXYCODONE (ROXICODONE) 30 MG IMMEDIATE RELEASE TABLET    Take one tablet every 3  hours daily as needed for pain   OXYCODONE 30 MG 12 HR TABLET    Take One tablet by mouth every 12 hours to relieve pain   TESTOSTERONE 30 MG/ACT SOLN    Apply one pump under the arm daily   TRAZODONE (DESYREL) 50 MG TABLET    Take 0.5-1 tablets (25-50 mg total) by mouth at bedtime as needed for sleep.  Modified Medications   No medications on file  Discontinued Medications   No medications on file     Physical Exam:  Vitals:   11/14/16  1456  BP: 140/82  Pulse: 97  Resp: 18  Temp: 97.9 F (36.6 C)  TempSrc: Oral  SpO2: 97%  Weight: 247 lb 3.2 oz (112.1 kg)  Height:  (1.905 m)   Body mass index is 30.9 kg/m.  Physical Exam  Constitutional: He is oriented to person, place, and time. He appears well-developed and well-nourished. No distress.  HENT:  Right Ear: External ear normal.  Nose: Nose normal.  Mouth/Throat: No oropharyngeal exudate.  Eyes: Pupils are equal, round, and reactive to light. Conjunctivae and EOM are normal.  Cardiovascular: Normal rate, regular rhythm and normal heart sounds.  Exam reveals no gallop.   Pulmonary/Chest: Effort normal and breath sounds normal.  Abdominal: Soft. Bowel sounds are normal.  Musculoskeletal: Normal range of motion. He exhibits tenderness (to right shoulder with movement). He exhibits no edema.  Neurological: He is alert and oriented to person, place, and time. He has normal reflexes. No cranial nerve deficit. Coordination normal.  Skin: Skin is warm and dry.  follicular erythematous papules and some pustules to left arm.   Psychiatric: He has a normal mood and affect. His behavior is normal. Judgment and thought content normal.   Labs reviewed: Basic Metabolic Panel:  Recent Labs  16/10/96 0929  NA 137  K 4.0  CL 100  CO2 24  GLUCOSE 102*  BUN 13  CREATININE 0.86  CALCIUM 9.3   Liver Function Tests:  Recent Labs  09/23/16 0929  AST 26  ALT 24  ALKPHOS 75  BILITOT 0.4  PROT 6.5  ALBUMIN 3.9   No results for input(s): LIPASE, AMYLASE in the last 8760 hours. No results for input(s): AMMONIA in the last 8760 hours. CBC: No results for input(s): WBC, NEUTROABS, HGB, HCT, MCV, PLT in the last 8760 hours. Lipid Panel: No results for input(s): CHOL, HDL, LDLCALC, TRIG, CHOLHDL, LDLDIRECT in the last 8760 hours. TSH: No results for input(s): TSH in the last 8760 hours. A1C: Lab Results  Component Value Date   HGBA1C 5.2 09/23/2016      Assessment/Plan 1. Vitamin D deficiency - Cholecalciferol (VITAMIN D3) 5000 units CAPS; Take one tablet by mouth Monday, Wednesday and Friday for Vitamin D Supplement.  Dispense: 30 capsule; Refill: 4 - Vitamin D, 25-hydroxy  2. Low testosterone -pt was taking 4X directed prior to lab work (2 pumps under each armpit) dose however not consistently. Now taking 1 pump under each armpit, educated that he should be using 1 pump under 1 arm. -follow up PSA   3. Folliculitis - doxycycline (VIBRA-TABS) 100 MG tablet; Take 1 tablet (100 mg total) by mouth 2 (two) times daily.  Dispense: 20 tablet; Refill: 0  4. Chronic pain syndrome -risk assessment tool used, 55% risk for overdose or serious opoid-induced respiratory depression in the next 6 month. Discussed this with him and his wife. He agrees he is dependant on medication. Also agreeable to cut back on  frequency at this time. He states he has already decreased to this in attempts to cut back - Drug Tox Monitor 1 w/Conf, Oral Fld - oxycodone (ROXICODONE) 30 MG immediate release tablet; Take one tablet every 4 hours daily as needed for pain  Dispense: 180 tablet; Refill: 0 Prescription not printed- last Rx given Friday 8/31, prior Rx given on 09/23/16. He should have enough medication to last until October with dose reduction and prior Rx history.  -only getting medication from this office, database has been check.  - CBC with Differential/Platelets  5. Insomnia, unspecified type Stable, not using trazodone.   6. Acute pain of right shoulder -going through employee health due to accident at work, ortho referral has been made.   7. Anxiety -stable, currently taking xanax daily, will discuss reduction of this at next visit as well.   Next appt: 2 months for further weaning of narcotics  Taylor Kidd K. Biagio Borg  [facility] [phone] 8 am - 5 pm) [phone] (after hours)

## 2016-11-15 LAB — PSA: PSA: 0.6 ng/mL (ref <=4.0)

## 2016-11-18 LAB — DRUG TOX MONITOR 1 W/CONF, ORAL FLD
Amphetamines: NEGATIVE ng/mL (ref <10)
Barbiturates: NEGATIVE ng/mL (ref <10)
Benzodiazepines: NEGATIVE ng/mL (ref <0.50)
Buprenorphine: NEGATIVE ng/mL (ref <0.025)
COCAINE: NEGATIVE ng/mL (ref <2.5)
COTININE: 83.9 ng/mL — AB (ref <5.0)
Codeine: NEGATIVE ng/mL (ref <2.5)
Dihydrocodeine: NEGATIVE ng/mL (ref <2.5)
Fentanyl: NEGATIVE ng/mL (ref <0.10)
Heroin Metabolite: NEGATIVE ng/mL (ref <1.0)
Hydrocodone: NEGATIVE ng/mL (ref <2.5)
Hydromorphone: NEGATIVE ng/mL (ref <2.5)
MARIJUANA: NEGATIVE ng/mL (ref <2.5)
MDMA: NEGATIVE ng/mL (ref <10)
MEPERIDINE: NEGATIVE ng/mL (ref <5.0)
MEPROBAMATE: NEGATIVE ng/mL (ref <2.5)
METHADONE: NEGATIVE ng/mL (ref <5.0)
MORPHINE: NEGATIVE ng/mL (ref <2.5)
NORHYDROCODONE: NEGATIVE ng/mL (ref <2.5)
NOROXYCODONE: 107.1 ng/mL — AB (ref <2.5)
Nicotine Metabolite: POSITIVE ng/mL — AB (ref <5.0)
OPIATES: POSITIVE ng/mL — AB (ref <2.5)
OXYMORPHONE: 8.5 ng/mL — AB (ref <2.5)
Oxycodone: >250 ng/mL — ABNORMAL HIGH (ref <2.5)
PHENCYCLIDINE: NEGATIVE ng/mL (ref <10)
Propoxyphene: NEGATIVE ng/mL (ref <5.0)
TRAMADOL: NEGATIVE ng/mL (ref <5.0)
Tapentadol: NEGATIVE ng/mL (ref <5.0)
ZOLPIDEM: NEGATIVE ng/mL (ref <5.0)

## 2016-11-18 LAB — VITAMIN D 25 HYDROXY (VIT D DEFICIENCY, FRACTURES): VIT D 25 HYDROXY: 52 ng/mL (ref 30–100)

## 2016-11-21 ENCOUNTER — Other Ambulatory Visit: Payer: Self-pay | Admitting: *Deleted

## 2016-11-21 DIAGNOSIS — G894 Chronic pain syndrome: Secondary | ICD-10-CM

## 2016-11-21 MED ORDER — OXYCODONE HCL 30 MG PO TABS: ORAL_TABLET | ORAL | 0 refills | Status: DC

## 2016-11-21 NOTE — Telephone Encounter (Signed)
Patient requested and will pick up [place] Database Checked.  

## 2016-11-25 ENCOUNTER — Other Ambulatory Visit: Payer: Self-pay | Admitting: *Deleted

## 2016-11-25 DIAGNOSIS — F419 Anxiety disorder, unspecified: Secondary | ICD-10-CM

## 2016-11-25 DIAGNOSIS — G894 Chronic pain syndrome: Secondary | ICD-10-CM

## 2016-11-25 MED ORDER — ALPRAZOLAM 0.25 MG PO TABS: ORAL_TABLET | ORAL | 0 refills | Status: DC

## 2016-11-25 MED ORDER — OXYCODONE HCL ER 30 MG PO T12A: EXTENDED_RELEASE_TABLET | ORAL | 0 refills | Status: DC

## 2016-11-25 NOTE — Telephone Encounter (Signed)
Patient requested and will pick up [place]CC[place] Database Verified.  

## 2016-12-02 ENCOUNTER — Other Ambulatory Visit: Payer: Self-pay | Admitting: Nurse Practitioner

## 2016-12-02 DIAGNOSIS — G8929 Other chronic pain: Secondary | ICD-10-CM

## 2016-12-02 DIAGNOSIS — M25511 Pain in right shoulder: Principal | ICD-10-CM

## 2016-12-12 NOTE — Progress Notes (Signed)
Please place orders in EPIC as patient is being scheduled for a pre-op appointment! Thank you! 

## 2016-12-13 ENCOUNTER — Ambulatory Visit: Payer: Self-pay | Admitting: Orthopedic Surgery

## 2016-12-13 NOTE — H&P (Signed)
Taylor MediciDavid Kidd is an 46 y.o. male.   Chief Complaint: R shoulder pain HPI: The patient is a 46 year old male who presents today for follow up of their shoulder. The patient is being followed for their right shoulder and back pain. They are 4 weeks and 6 days out from injury (DOI 11/02/16). Symptoms reported today include: pain. and report their pain level to be 7 / 10. Current treatment includes: NSAIDs (Naproxen). The following medication has been used for pain control: antiinflammatory medication. The patient presents today following MRI. Note for "Shoulder complaints": The patient was placed on a 10lb. lifting restriction and no overhead work.  patient reports continued pain in the shoulder. No pain prior to this injury.  Past Medical History:  Diagnosis Date  . Heartburn   . Hematuria, unspecified   . Lumbago   . Other abnormal blood chemistry   . Other malaise and fatigue   . Palpitations   . Unspecified essential hypertension   . Unspecified vitamin D deficiency   Thoracic spine pain (M54.6)  Anxiety disorder Chronic pain  Allergies  No Known Drug Allergies [11/23/2016]:  Family History No pertinent family history   Medication History  OxyCODONE HCl (30MG  Tablet, Oral) Active. OxyCONTIN (30MG  Tab 12HR Deter, Oral) Active. MetFORMIN HCl (500MG  Tablet, Oral) Active. ALPRAZolam (0.25MG  Tablet, Oral) Active. (PRN) Testosterone (30MG /ACT Solution, Transdermal) Active. Naproxen (250MG  Tablet, Oral) Active.   Past Surgical History Vasectomy   Social History  Tobacco use  Current every day smoker. 11/23/2016: smoke(d) 1/2 pack(s) per day   reports that he has been smoking Cigarettes.  He has been smoking about 0.50 packs per day. He has never used smokeless tobacco. He reports that he drinks alcohol. He reports that he does not use drugs.  Allergies: No Known Allergies   (Not in a hospital admission)  No results found for this or any previous visit (from the  past 48 hour(s)). No results found.  Review of Systems  Constitutional: Negative.   HENT: Negative.   Eyes: Negative.   Respiratory: Negative.   Cardiovascular: Negative.   Gastrointestinal: Negative.   Genitourinary: Negative.   Musculoskeletal: Positive for joint pain.  Skin: Negative.   Neurological: Negative.   Psychiatric/Behavioral: Negative.     There were no vitals taken for this visit. Physical Exam  Constitutional: He is oriented to person, place, and time. He appears well-developed.  HENT:  Head: Normocephalic.  Eyes: Pupils are equal, round, and reactive to light.  Neck: Normal range of motion.  Cardiovascular: Normal rate.   Respiratory: Effort normal.  GI: Soft.  Musculoskeletal:  Tender in the anterior subacromial region. Positive drop arm sign. Negative Speed's test. Nontender over the before meals. Slightly weak in external rotation. Positive impingement sign. Painless range of motion cervical spine.   Neurological: He is alert and oriented to person, place, and time.  Skin: Skin is warm and dry.    MRI demonstrates a full-thickness tear in the supraspinatus just medial to the footprint  Interstitial tearing of the subscapularis. 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 Some partial tearing of the interest infraspinatus.  Assessment/Plan 1.Small but full-thickness tear of the supraspinatus partial tears of the infraspinatus and the subscapularis. 2. Asymptomatic AC arthrosis.  Plan: Discussed options. Given the symptomatic and unable to increase level of his activity and a full-thickness tear we discussed rotator cuff repair. Arthroscopically assisted. Probable mini open rotator cuff repair with suture anchor. I do not feel that we need to address the [facility].  I had a long discussion with the patient concerning the risks and benefits of a rotator cuff repair, including bleeding, infection, prolonged postoperative recovery, which may require 3 to 5  months until maximum medical improvement. Overight procedure with initiation of early passive range of motion within physical therapy. Avoid any active motion for the first six weeks. This is all in an effort to avoid recurrent tear of the rotator cuff and adhesive capsulitis. Return to work without use of the arm can be obtained following two weeks. However, driving will be a challenge. We also discussed the possibility of requiring implants including bone anchors,as well as an Allograft patch graft if a massive rotator cuff tear is encountered. Removal of any bones for spurs as well as bursitis will be performed during the procedure and also any associated anesthetic complications as well.  We discussed outpatient or overnight. Sutures out in 2 weeks. Physical therapy at max medical improvement 3-5 months postoperatively.  Light duty available at 6 weeks postoperatively.  He does work for [employer]. Continue his light duty in the interim. Otherwise medically clear. Do feel this is related to his work injury.  Plan right shoulder arthroscopy, SAD, mini-open RCR  Taylor Kidd, [name]., PA-C for Dr. [name] 12/13/2016, 10:08 AM

## 2016-12-15 ENCOUNTER — Encounter: Payer: Self-pay | Admitting: *Deleted

## 2016-12-19 NOTE — Patient Instructions (Signed)
Taylor MediciDavid Kidd  12/19/2016   Your procedure is scheduled on: 12/22/2016    [facility]  Entrance Take [place]  elevators to 3rd floor to  Short Stay Center at    0530 AM.  Follow signs to Short Stay on first floor at AM  Call this number if you have problems the morning of surgery [phone]    Remember: ONLY 1 PERSON MAY GO WITH YOU TO SHORT STAY TO GET  READY MORNING OF YOUR SURGERY.  Do not eat food or drink liquids :After Midnight.     Take these medicines the morning of surgery with A SIP OF WATER: Oxycodone if needed  DO NOT TAKE ANY DIABETIC MEDICATIONS DAY OF YOUR SURGERY                               You may not have any metal on your body including hair pins and              piercings  Do not wear jewelry, , lotions, powders or perfumes, deodorant                         Men may shave face and neck.   Do not bring valuables to the hospital. [name] IS NOT             RESPONSIBLE   FOR VALUABLES.  Contacts, dentures or bridgework may not be worn into surgery.      Patients discharged the day of surgery will not be allowed to drive home.  Name and phone number of your driver:                Please read over the following fact sheets you were given: _____________________________________________________________________             [facility] - Preparing for Surgery Before surgery, you can play an important role.  Because skin is not sterile, your skin needs to be as free of germs as possible.  You can reduce the number of germs on your skin by washing with CHG (chlorahexidine gluconate) soap before surgery.  CHG is an antiseptic cleaner which kills germs and bonds with the skin to continue killing germs even after washing. Please DO NOT use if you have an allergy to CHG or antibacterial soaps.  If your skin becomes reddened/irritated stop using the CHG and inform your nurse when you arrive at Short Stay. Do not shave (including  legs and underarms) for at least 48 hours prior to the first CHG shower.  You may shave your face/neck. Please follow these instructions carefully:  1.  Shower with CHG Soap the night before surgery and the  morning of Surgery.  2.  If you choose to wash your hair, wash your hair first as usual with your  normal  shampoo.  3.  After you shampoo, rinse your hair and body thoroughly to remove the  shampoo.                           4.  Use CHG as you would any other liquid soap.  You can apply chg directly  to the skin and wash  Gently with a scrungie or clean washcloth.  5.  Apply the CHG Soap to your body ONLY FROM THE NECK DOWN.   Do not use on face/ open                           Wound or open sores. Avoid contact with eyes, ears mouth and genitals (private parts).                       Wash face,  Genitals (private parts) with your normal soap.             6.  Wash thoroughly, paying special attention to the area where your surgery  will be performed.  7.  Thoroughly rinse your body with warm water from the neck down.  8.  DO NOT shower/wash with your normal soap after using and rinsing off  the CHG Soap.                9.  Pat yourself dry with a clean towel.            10.  Wear clean pajamas.            11.  Place clean sheets on your bed the night of your first shower and do not  sleep with pets. Day of Surgery : Do not apply any lotions/deodorants the morning of surgery.  Please wear clean clothes to the hospital/surgery center.  FAILURE TO FOLLOW THESE INSTRUCTIONS MAY RESULT IN THE CANCELLATION OF YOUR SURGERY PATIENT SIGNATURE_________________________________  NURSE SIGNATURE__________________________________  ________________________________________________________________________   Taylor Kidd  An incentive spirometer is a tool that can help keep your lungs clear and active. This tool measures how well you are filling your lungs with each breath.  Taking long deep breaths may help reverse or decrease the chance of developing breathing (pulmonary) problems (especially infection) following:  A long period of time when you are unable to move or be active. BEFORE THE PROCEDURE   If the spirometer includes an indicator to show your best effort, your nurse or respiratory therapist will set it to a desired goal.  If possible, sit up straight or lean slightly forward. Try not to slouch.  Hold the incentive spirometer in an upright position. INSTRUCTIONS FOR USE  1. Sit on the edge of your bed if possible, or sit up as far as you can in bed or on a chair. 2. Hold the incentive spirometer in an upright position. 3. Breathe out normally. 4. Place the mouthpiece in your mouth and seal your lips tightly around it. 5. Breathe in slowly and as deeply as possible, raising the piston or the ball toward the top of the column. 6. Hold your breath for 3-5 seconds or for as long as possible. Allow the piston or ball to fall to the bottom of the column. 7. Remove the mouthpiece from your mouth and breathe out normally. 8. Rest for a few seconds and repeat Steps 1 through 7 at least 10 times every 1-2 hours when you are awake. Take your time and take a few normal breaths between deep breaths. 9. The spirometer may include an indicator to show your best effort. Use the indicator as a goal to work toward during each repetition. 10. After each set of 10 deep breaths, practice coughing to be sure your lungs are clear. If you have an incision (the cut made at the time of surgery),  support your incision when coughing by placing a pillow or rolled up towels firmly against it. Once you are able to get out of bed, walk around indoors and cough well. You may stop using the incentive spirometer when instructed by your caregiver.  RISKS AND COMPLICATIONS  Take your time so you do not get dizzy or light-headed.  If you are in pain, you may need to take or ask for pain  medication before doing incentive spirometry. It is harder to take a deep breath if you are having pain. AFTER USE  Rest and breathe slowly and easily.  It can be helpful to keep track of a log of your progress. Your caregiver can provide you with a simple table to help with this. If you are using the spirometer at home, follow these instructions: [place] IF:   You are having difficultly using the spirometer.  You have trouble using the spirometer as often as instructed.  Your pain medication is not giving enough relief while using the spirometer.  You develop fever of 100.5 F (38.1 C) or higher. SEEK IMMEDIATE MEDICAL CARE IF:   You cough up bloody sputum that had not been present before.  You develop fever of 102 F (38.9 C) or greater.  You develop worsening pain at or near the incision site. MAKE SURE YOU:   Understand these instructions.  Will watch your condition.  Will get help right away if you are not doing well or get worse. Document Released: 06/20/2006 Document Revised: 05/02/2011 Document Reviewed: 08/21/2006 [facility] Patient Information 2014 [place], [place].   ________________________________________________________________________

## 2016-12-20 ENCOUNTER — Encounter (HOSPITAL_COMMUNITY): Payer: Self-pay

## 2016-12-20 ENCOUNTER — Other Ambulatory Visit: Payer: Self-pay | Admitting: *Deleted

## 2016-12-20 ENCOUNTER — Encounter (HOSPITAL_COMMUNITY)
Admission: RE | Admit: 2016-12-20 | Discharge: 2016-12-20 | Disposition: A | Discharge status: Home / Self Care | Payer: Worker's Compensation | Source: Ambulatory Visit | Attending: Specialist | Admitting: Specialist

## 2016-12-20 ENCOUNTER — Telehealth: Payer: Self-pay

## 2016-12-20 ENCOUNTER — Other Ambulatory Visit: Payer: Self-pay | Admitting: Nurse Practitioner

## 2016-12-20 ENCOUNTER — Encounter (INDEPENDENT_AMBULATORY_CARE_PROVIDER_SITE_OTHER): Payer: Self-pay

## 2016-12-20 DIAGNOSIS — F419 Anxiety disorder, unspecified: Secondary | ICD-10-CM | POA: Diagnosis not present

## 2016-12-20 DIAGNOSIS — F329 Major depressive disorder, single episode, unspecified: Secondary | ICD-10-CM | POA: Diagnosis not present

## 2016-12-20 DIAGNOSIS — F17210 Nicotine dependence, cigarettes, uncomplicated: Secondary | ICD-10-CM | POA: Diagnosis not present

## 2016-12-20 DIAGNOSIS — M19011 Primary osteoarthritis, right shoulder: Secondary | ICD-10-CM | POA: Diagnosis not present

## 2016-12-20 DIAGNOSIS — Z79899 Other long term (current) drug therapy: Secondary | ICD-10-CM | POA: Diagnosis not present

## 2016-12-20 DIAGNOSIS — Y990 Civilian activity done for income or pay: Secondary | ICD-10-CM | POA: Diagnosis not present

## 2016-12-20 DIAGNOSIS — E119 Type 2 diabetes mellitus without complications: Secondary | ICD-10-CM | POA: Diagnosis not present

## 2016-12-20 DIAGNOSIS — Z7984 Long term (current) use of oral hypoglycemic drugs: Secondary | ICD-10-CM | POA: Diagnosis not present

## 2016-12-20 DIAGNOSIS — K219 Gastro-esophageal reflux disease without esophagitis: Secondary | ICD-10-CM | POA: Diagnosis not present

## 2016-12-20 DIAGNOSIS — G894 Chronic pain syndrome: Secondary | ICD-10-CM

## 2016-12-20 DIAGNOSIS — M75121 Complete rotator cuff tear or rupture of right shoulder, not specified as traumatic: Secondary | ICD-10-CM | POA: Diagnosis not present

## 2016-12-20 DIAGNOSIS — X58XXXA Exposure to other specified factors, initial encounter: Secondary | ICD-10-CM | POA: Diagnosis not present

## 2016-12-20 DIAGNOSIS — I10 Essential (primary) hypertension: Secondary | ICD-10-CM | POA: Diagnosis not present

## 2016-12-20 HISTORY — DX: Gastro-esophageal reflux disease without esophagitis: K21.9

## 2016-12-20 HISTORY — DX: Unspecified osteoarthritis, unspecified site: M19.90

## 2016-12-20 HISTORY — DX: Type 2 diabetes mellitus without complications: E11.9

## 2016-12-20 HISTORY — DX: Major depressive disorder, single episode, unspecified: F32.9

## 2016-12-20 HISTORY — DX: Anxiety disorder, unspecified: F41.9

## 2016-12-20 HISTORY — DX: Depression, unspecified: F32.A

## 2016-12-20 LAB — CBC
HCT: 44.2 % (ref 39.0–52.0)
Hemoglobin: 15.2 g/dL (ref 13.0–17.0)
MCH: 27.4 pg (ref 26.0–34.0)
MCHC: 34.4 g/dL (ref 30.0–36.0)
MCV: 79.6 fL (ref 78.0–100.0)
PLATELETS: 248 10*3/uL (ref 150–400)
RBC: 5.55 MIL/uL (ref 4.22–5.81)
RDW: 12.8 % (ref 11.5–15.5)
WBC: 10 10*3/uL (ref 4.0–10.5)

## 2016-12-20 LAB — BASIC METABOLIC PANEL
Anion gap: 9 (ref 5–15)
BUN: 16 mg/dL (ref 6–20)
CHLORIDE: 103 mmol/L (ref 101–111)
CO2: 27 mmol/L (ref 22–32)
CREATININE: 0.83 mg/dL (ref 0.61–1.24)
Calcium: 9.6 mg/dL (ref 8.9–10.3)
GFR calc Af Amer: >60 mL/min (ref >60)
GFR calc non Af Amer: >60 mL/min (ref >60)
Glucose, Bld: 93 mg/dL (ref 65–99)
Potassium: 4.2 mmol/L (ref 3.5–5.1)
SODIUM: 139 mmol/L (ref 135–145)

## 2016-12-20 LAB — HEMOGLOBIN A1C
Hgb A1c MFr Bld: 5.7 % — ABNORMAL HIGH (ref 4.8–5.6)
Mean Plasma Glucose: 116.89 mg/dL

## 2016-12-20 LAB — GLUCOSE, CAPILLARY: Glucose-Capillary: 95 mg/dL (ref 65–99)

## 2016-12-20 MED ORDER — OXYCODONE HCL 30 MG PO TABS: ORAL_TABLET | ORAL | 0 refills | Status: DC

## 2016-12-20 NOTE — Progress Notes (Unsigned)
Please notify pt that Rx is ready (Oxy IR) 30 mg every 4 hours as needed for pain, realize he is having surgery 12/22/16, please let pt know that next month we will be reducing frequency to every 6 hours as needed for pain as discussed for weaning medication down. Can schedule a sooner OV if he would like I know he has a follow up in November.

## 2016-12-20 NOTE — Telephone Encounter (Signed)
Patient was notified and agreed. Rx was placed in from filing cabinet for pick up.      Sharon SellerEubanks, Jessica K, NP routed conversation to You Just now (11:48 AM)    Sharon SellerEubanks, Jessica K, NP 2 minutes ago (11:45 AM)      Please notify pt that Rx is ready (Oxy IR) 30 mg every 4 hours as needed for pain, realize he is having surgery 12/22/16, please let pt know that next month we will be reducing frequency to every 6 hours as needed for pain as discussed for weaning medication down. Can schedule a sooner OV if he would like I know he has a follow up in November.

## 2016-12-20 NOTE — Telephone Encounter (Signed)
Patient requested and will pick up [place]CC[place] Database Verified.  

## 2016-12-21 NOTE — Progress Notes (Signed)
Final EKG done 12/20/16-epic  

## 2016-12-22 ENCOUNTER — Encounter (HOSPITAL_COMMUNITY)
Admission: RE | Disposition: A | Discharge status: Home / Self Care | Payer: Self-pay | Source: Ambulatory Visit | Attending: Specialist

## 2016-12-22 ENCOUNTER — Ambulatory Visit (HOSPITAL_COMMUNITY): Payer: Worker's Compensation | Admitting: Anesthesiology

## 2016-12-22 ENCOUNTER — Encounter (HOSPITAL_COMMUNITY): Payer: Self-pay | Admitting: *Deleted

## 2016-12-22 ENCOUNTER — Ambulatory Visit (HOSPITAL_COMMUNITY)
Admission: RE | Admit: 2016-12-22 | Discharge: 2016-12-22 | Disposition: A | Discharge status: Home / Self Care | Payer: Worker's Compensation | Source: Ambulatory Visit | Attending: Specialist | Admitting: Specialist

## 2016-12-22 DIAGNOSIS — M19011 Primary osteoarthritis, right shoulder: Secondary | ICD-10-CM | POA: Insufficient documentation

## 2016-12-22 DIAGNOSIS — Z79899 Other long term (current) drug therapy: Secondary | ICD-10-CM | POA: Insufficient documentation

## 2016-12-22 DIAGNOSIS — M75120 Complete rotator cuff tear or rupture of unspecified shoulder, not specified as traumatic: Secondary | ICD-10-CM | POA: Diagnosis present

## 2016-12-22 DIAGNOSIS — M75121 Complete rotator cuff tear or rupture of right shoulder, not specified as traumatic: Secondary | ICD-10-CM | POA: Insufficient documentation

## 2016-12-22 DIAGNOSIS — I10 Essential (primary) hypertension: Secondary | ICD-10-CM | POA: Insufficient documentation

## 2016-12-22 DIAGNOSIS — F329 Major depressive disorder, single episode, unspecified: Secondary | ICD-10-CM | POA: Insufficient documentation

## 2016-12-22 DIAGNOSIS — E119 Type 2 diabetes mellitus without complications: Secondary | ICD-10-CM | POA: Insufficient documentation

## 2016-12-22 DIAGNOSIS — F17210 Nicotine dependence, cigarettes, uncomplicated: Secondary | ICD-10-CM | POA: Insufficient documentation

## 2016-12-22 DIAGNOSIS — F419 Anxiety disorder, unspecified: Secondary | ICD-10-CM | POA: Diagnosis not present

## 2016-12-22 DIAGNOSIS — K219 Gastro-esophageal reflux disease without esophagitis: Secondary | ICD-10-CM | POA: Insufficient documentation

## 2016-12-22 DIAGNOSIS — Y990 Civilian activity done for income or pay: Secondary | ICD-10-CM | POA: Insufficient documentation

## 2016-12-22 DIAGNOSIS — Z7984 Long term (current) use of oral hypoglycemic drugs: Secondary | ICD-10-CM | POA: Insufficient documentation

## 2016-12-22 DIAGNOSIS — X58XXXA Exposure to other specified factors, initial encounter: Secondary | ICD-10-CM | POA: Insufficient documentation

## 2016-12-22 HISTORY — PX: SHOULDER ARTHROSCOPY WITH ROTATOR CUFF REPAIR AND SUBACROMIAL DECOMPRESSION: SHX5686

## 2016-12-22 LAB — GLUCOSE, CAPILLARY
GLUCOSE-CAPILLARY: 103 mg/dL — AB (ref 65–99)
GLUCOSE-CAPILLARY: 135 mg/dL — AB (ref 65–99)

## 2016-12-22 SURGERY — SHOULDER ARTHROSCOPY WITH ROTATOR CUFF REPAIR AND SUBACROMIAL DECOMPRESSION
Anesthesia: General | Site: Shoulder | Laterality: Right

## 2016-12-22 MED ORDER — DEXAMETHASONE SODIUM PHOSPHATE 10 MG/ML IJ SOLN
INTRAMUSCULAR | Status: DC | PRN
  Administered 2016-12-22: 10 mg via INTRAVENOUS

## 2016-12-22 MED ORDER — ONDANSETRON HCL 4 MG/2ML IJ SOLN
INTRAMUSCULAR | Status: DC | PRN
  Administered 2016-12-22: 4 mg via INTRAVENOUS

## 2016-12-22 MED ORDER — OXYCODONE-ACETAMINOPHEN 10-325 MG PO TABS: 1.0000 | ORAL_TABLET | Freq: Four times a day (QID) | ORAL | 0 refills | Status: DC | PRN

## 2016-12-22 MED ORDER — HYDROMORPHONE HCL 1 MG/ML IJ SOLN: 1.0000 mg | INTRAMUSCULAR | Status: DC | PRN

## 2016-12-22 MED ORDER — ONDANSETRON HCL 4 MG/2ML IJ SOLN
INTRAMUSCULAR | Status: AC
  Filled 2016-12-22: qty 2

## 2016-12-22 MED ORDER — ONDANSETRON HCL 4 MG/2ML IJ SOLN: 4.0000 mg | Freq: Four times a day (QID) | INTRAMUSCULAR | Status: DC | PRN

## 2016-12-22 MED ORDER — ALUM & MAG HYDROXIDE-SIMETH 200-200-20 MG/5ML PO SUSP: 30.0000 mL | ORAL | Status: DC | PRN

## 2016-12-22 MED ORDER — PHENOL 1.4 % MT LIQD: 1.0000 | OROMUCOSAL | Status: DC | PRN

## 2016-12-22 MED ORDER — BUPIVACAINE-EPINEPHRINE 0.5% -1:200000 IJ SOLN
INTRAMUSCULAR | Status: DC | PRN
  Administered 2016-12-22: 30 mL

## 2016-12-22 MED ORDER — OXYCODONE HCL ER 15 MG PO T12A
30.0000 mg | EXTENDED_RELEASE_TABLET | Freq: Two times a day (BID) | ORAL | Status: DC
  Administered 2016-12-22: 12:00:00 30 mg via ORAL
  Filled 2016-12-22: qty 2

## 2016-12-22 MED ORDER — FENTANYL CITRATE (PF) 100 MCG/2ML IJ SOLN
INTRAMUSCULAR | Status: DC | PRN
  Administered 2016-12-22: 100 ug via INTRAVENOUS
  Administered 2016-12-22: 50 ug via INTRAVENOUS
  Administered 2016-12-22: 100 ug via INTRAVENOUS

## 2016-12-22 MED ORDER — ACETAMINOPHEN 325 MG PO TABS: 650.0000 mg | ORAL_TABLET | ORAL | Status: DC | PRN

## 2016-12-22 MED ORDER — DEXAMETHASONE SODIUM PHOSPHATE 10 MG/ML IJ SOLN
INTRAMUSCULAR | Status: AC
  Filled 2016-12-22: qty 1

## 2016-12-22 MED ORDER — CEFAZOLIN SODIUM-DEXTROSE 2-4 GM/100ML-% IV SOLN
2.0000 g | INTRAVENOUS | Status: AC
  Administered 2016-12-22: 2 g via INTRAVENOUS

## 2016-12-22 MED ORDER — KETOROLAC TROMETHAMINE 10 MG PO TABS: 10.0000 mg | ORAL_TABLET | Freq: Four times a day (QID) | ORAL | 0 refills | Status: DC | PRN

## 2016-12-22 MED ORDER — INSULIN ASPART 100 UNIT/ML ~~LOC~~ SOLN
0.0000 [IU] | Freq: Three times a day (TID) | SUBCUTANEOUS | Status: DC
  Administered 2016-12-22: 2 [IU] via SUBCUTANEOUS

## 2016-12-22 MED ORDER — PROPOFOL 10 MG/ML IV BOLUS
INTRAVENOUS | Status: DC | PRN
  Administered 2016-12-22: 200 mg via INTRAVENOUS

## 2016-12-22 MED ORDER — ROCURONIUM BROMIDE 50 MG/5ML IV SOSY
PREFILLED_SYRINGE | INTRAVENOUS | Status: AC
  Filled 2016-12-22: qty 5

## 2016-12-22 MED ORDER — MIDAZOLAM HCL 2 MG/2ML IJ SOLN
INTRAMUSCULAR | Status: AC
  Filled 2016-12-22: qty 2

## 2016-12-22 MED ORDER — LIDOCAINE 2% (20 MG/ML) 5 ML SYRINGE
INTRAMUSCULAR | Status: AC
  Filled 2016-12-22: qty 5

## 2016-12-22 MED ORDER — SODIUM CHLORIDE 0.9 % IV SOLN
INTRAVENOUS | Status: AC
  Filled 2016-12-22: qty 500000

## 2016-12-22 MED ORDER — SUGAMMADEX SODIUM 200 MG/2ML IV SOLN
INTRAVENOUS | Status: DC | PRN
  Administered 2016-12-22: 250 mg via INTRAVENOUS

## 2016-12-22 MED ORDER — METHOCARBAMOL 500 MG PO TABS: 500.0000 mg | ORAL_TABLET | Freq: Four times a day (QID) | ORAL | 1 refills | Status: DC | PRN

## 2016-12-22 MED ORDER — BISACODYL 5 MG PO TBEC: 5.0000 mg | DELAYED_RELEASE_TABLET | Freq: Every day | ORAL | Status: DC | PRN

## 2016-12-22 MED ORDER — ROCURONIUM BROMIDE 10 MG/ML (PF) SYRINGE
PREFILLED_SYRINGE | INTRAVENOUS | Status: DC | PRN
  Administered 2016-12-22: 20 mg via INTRAVENOUS
  Administered 2016-12-22: 10 mg via INTRAVENOUS
  Administered 2016-12-22: 20 mg via INTRAVENOUS
  Administered 2016-12-22: 50 mg via INTRAVENOUS

## 2016-12-22 MED ORDER — LACTATED RINGERS IV SOLN
INTRAVENOUS | Status: DC
  Administered 2016-12-22 (×2): via INTRAVENOUS

## 2016-12-22 MED ORDER — PROPOFOL 10 MG/ML IV BOLUS
INTRAVENOUS | Status: AC
  Filled 2016-12-22: qty 20

## 2016-12-22 MED ORDER — METOCLOPRAMIDE HCL 5 MG/ML IJ SOLN: 5.0000 mg | Freq: Three times a day (TID) | INTRAMUSCULAR | Status: DC | PRN

## 2016-12-22 MED ORDER — LIDOCAINE 2% (20 MG/ML) 5 ML SYRINGE
INTRAMUSCULAR | Status: DC | PRN
  Administered 2016-12-22: 100 mg via INTRAVENOUS

## 2016-12-22 MED ORDER — METHOCARBAMOL 1000 MG/10ML IJ SOLN
500.0000 mg | Freq: Four times a day (QID) | INTRAVENOUS | Status: DC | PRN
  Administered 2016-12-22: 500 mg via INTRAVENOUS
  Filled 2016-12-22: qty 5

## 2016-12-22 MED ORDER — SODIUM CHLORIDE 0.9 % IV SOLN
INTRAVENOUS | Status: DC | PRN
  Administered 2016-12-22: 500 mL

## 2016-12-22 MED ORDER — DIPHENHYDRAMINE HCL 12.5 MG/5ML PO ELIX: 12.5000 mg | ORAL_SOLUTION | ORAL | Status: DC | PRN

## 2016-12-22 MED ORDER — EPINEPHRINE PF 1 MG/ML IJ SOLN
INTRAMUSCULAR | Status: DC | PRN
  Administered 2016-12-22: 1 mg

## 2016-12-22 MED ORDER — POTASSIUM CHLORIDE IN NACL 20-0.9 MEQ/L-% IV SOLN
INTRAVENOUS | Status: DC
  Administered 2016-12-22: 12:00:00 via INTRAVENOUS
  Filled 2016-12-22: qty 1000

## 2016-12-22 MED ORDER — MIDAZOLAM HCL 2 MG/2ML IJ SOLN
INTRAMUSCULAR | Status: DC | PRN
  Administered 2016-12-22: 2 mg via INTRAVENOUS

## 2016-12-22 MED ORDER — BUPIVACAINE-EPINEPHRINE (PF) 0.5% -1:200000 IJ SOLN
INTRAMUSCULAR | Status: AC
  Filled 2016-12-22: qty 30

## 2016-12-22 MED ORDER — ALPRAZOLAM 0.25 MG PO TABS: 0.2500 mg | ORAL_TABLET | Freq: Every evening | ORAL | Status: DC | PRN

## 2016-12-22 MED ORDER — KETOROLAC TROMETHAMINE 15 MG/ML IJ SOLN: 15.0000 mg | Freq: Four times a day (QID) | INTRAMUSCULAR | Status: DC | PRN

## 2016-12-22 MED ORDER — DOCUSATE SODIUM 100 MG PO CAPS: 100.0000 mg | ORAL_CAPSULE | Freq: Two times a day (BID) | ORAL | Status: DC

## 2016-12-22 MED ORDER — CHLORHEXIDINE GLUCONATE 4 % EX LIQD: 60.0000 mL | Freq: Once | CUTANEOUS | Status: DC

## 2016-12-22 MED ORDER — SUGAMMADEX SODIUM 500 MG/5ML IV SOLN
INTRAVENOUS | Status: AC
  Filled 2016-12-22: qty 5

## 2016-12-22 MED ORDER — ENOXAPARIN SODIUM 40 MG/0.4ML ~~LOC~~ SOLN: 40.0000 mg | SUBCUTANEOUS | Status: DC

## 2016-12-22 MED ORDER — SUCCINYLCHOLINE CHLORIDE 200 MG/10ML IV SOSY
PREFILLED_SYRINGE | INTRAVENOUS | Status: AC
Start: 2016-12-22 — End: 2016-12-22
  Filled 2016-12-22: qty 10

## 2016-12-22 MED ORDER — ACETAMINOPHEN 650 MG RE SUPP: 650.0000 mg | RECTAL | Status: DC | PRN

## 2016-12-22 MED ORDER — CEFAZOLIN SODIUM-DEXTROSE 2-4 GM/100ML-% IV SOLN
2.0000 g | Freq: Four times a day (QID) | INTRAVENOUS | Status: DC
  Administered 2016-12-22: 2 g via INTRAVENOUS

## 2016-12-22 MED ORDER — MEPERIDINE HCL 50 MG/ML IJ SOLN: 6.2500 mg | INTRAMUSCULAR | Status: DC | PRN

## 2016-12-22 MED ORDER — DOCUSATE SODIUM 100 MG PO CAPS: 100.0000 mg | ORAL_CAPSULE | Freq: Two times a day (BID) | ORAL | 1 refills | Status: DC | PRN

## 2016-12-22 MED ORDER — FENTANYL CITRATE (PF) 250 MCG/5ML IJ SOLN
INTRAMUSCULAR | Status: AC
  Filled 2016-12-22: qty 5

## 2016-12-22 MED ORDER — LACTATED RINGERS IR SOLN
Status: DC | PRN
  Administered 2016-12-22: 3000 mL

## 2016-12-22 MED ORDER — ONDANSETRON HCL 4 MG PO TABS: 4.0000 mg | ORAL_TABLET | Freq: Four times a day (QID) | ORAL | Status: DC | PRN

## 2016-12-22 MED ORDER — BUPIVACAINE-EPINEPHRINE (PF) 0.5% -1:200000 IJ SOLN
INTRAMUSCULAR | Status: DC | PRN
  Administered 2016-12-22: 30 mL via PERINEURAL

## 2016-12-22 MED ORDER — METHOCARBAMOL 500 MG PO TABS
500.0000 mg | ORAL_TABLET | Freq: Four times a day (QID) | ORAL | Status: DC | PRN
Start: 2016-12-22 — End: 2016-12-22

## 2016-12-22 MED ORDER — POLYETHYLENE GLYCOL 3350 17 G PO PACK: 17.0000 g | PACK | Freq: Every day | ORAL | 0 refills | Status: DC

## 2016-12-22 MED ORDER — OXYCODONE HCL 5 MG PO TABS
5.0000 mg | ORAL_TABLET | ORAL | Status: DC | PRN
  Administered 2016-12-22: 15:00:00 5 mg via ORAL
  Filled 2016-12-22: qty 1

## 2016-12-22 MED ORDER — TRAZODONE HCL 50 MG PO TABS: 25.0000 mg | ORAL_TABLET | Freq: Every evening | ORAL | Status: DC | PRN

## 2016-12-22 MED ORDER — EPINEPHRINE PF 1 MG/ML IJ SOLN
INTRAMUSCULAR | Status: AC
  Filled 2016-12-22: qty 2

## 2016-12-22 MED ORDER — CEFAZOLIN SODIUM-DEXTROSE 2-4 GM/100ML-% IV SOLN
INTRAVENOUS | Status: AC
  Filled 2016-12-22: qty 100

## 2016-12-22 MED ORDER — HYDROMORPHONE HCL 1 MG/ML IJ SOLN
0.2500 mg | INTRAMUSCULAR | Status: DC | PRN
  Administered 2016-12-22 (×2): 0.5 mg via INTRAVENOUS

## 2016-12-22 MED ORDER — MENTHOL 3 MG MT LOZG: 1.0000 | LOZENGE | OROMUCOSAL | Status: DC | PRN

## 2016-12-22 MED ORDER — POLYETHYLENE GLYCOL 3350 17 G PO PACK: 17.0000 g | PACK | Freq: Every day | ORAL | Status: DC | PRN

## 2016-12-22 MED ORDER — MAGNESIUM CITRATE PO SOLN: 1.0000 | Freq: Once | ORAL | Status: DC | PRN

## 2016-12-22 MED ORDER — ONDANSETRON HCL 4 MG/2ML IJ SOLN: 4.0000 mg | Freq: Once | INTRAMUSCULAR | Status: DC | PRN

## 2016-12-22 MED ORDER — METOCLOPRAMIDE HCL 5 MG PO TABS: 5.0000 mg | ORAL_TABLET | Freq: Three times a day (TID) | ORAL | Status: DC | PRN

## 2016-12-22 MED ORDER — HYDROMORPHONE HCL 1 MG/ML IJ SOLN
INTRAMUSCULAR | Status: AC
  Filled 2016-12-22: qty 1

## 2016-12-22 SURGICAL SUPPLY — 56 items
ANCHOR NEEDLE 9/16 CIR SZ 8 (NEEDLE) IMPLANT
ANCHOR PEEK 4.75X19.1 SWLK C (Anchor) ×3 IMPLANT
BLADE CUDA SHAVER 3.5 (BLADE) ×3 IMPLANT
BLADE SURG SZ11 CARB STEEL (BLADE) ×3 IMPLANT
BUR OVAL 4.0 (BURR) IMPLANT
CANNULA ACUFO 5X76 (CANNULA) ×3 IMPLANT
CLEANER TIP ELECTROSURG 2X2 (MISCELLANEOUS) IMPLANT
CLOSURE STERI-STRIP 1/4X4 (GAUZE/BANDAGES/DRESSINGS) ×3 IMPLANT
CLOSURE WOUND 1/2 X4 (GAUZE/BANDAGES/DRESSINGS)
CLOTH 2% CHLOROHEXIDINE 3PK (PERSONAL CARE ITEMS) ×3 IMPLANT
COVER SURGICAL LIGHT HANDLE (MISCELLANEOUS) ×3 IMPLANT
DRAPE ORTHO SPLIT 77X108 STRL (DRAPES) ×2
DRAPE POUCH INSTRU U-SHP 10X18 (DRAPES) IMPLANT
DRAPE STERI 35X30 U-POUCH (DRAPES) ×3 IMPLANT
DRAPE SURG ORHT 6 SPLT 77X108 (DRAPES) ×1 IMPLANT
DRSG AQUACEL AG ADV 3.5X 4 (GAUZE/BANDAGES/DRESSINGS) IMPLANT
DRSG AQUACEL AG ADV 3.5X 6 (GAUZE/BANDAGES/DRESSINGS) ×3 IMPLANT
DRSG PAD ABDOMINAL 8X10 ST (GAUZE/BANDAGES/DRESSINGS) IMPLANT
DURAPREP 26ML APPLICATOR (WOUND CARE) ×3 IMPLANT
ELECT NEEDLE TIP 2.8 STRL (NEEDLE) ×3 IMPLANT
ELECT REM PT RETURN 15FT ADLT (MISCELLANEOUS) ×3 IMPLANT
FILTER STRAW (MISCELLANEOUS) ×3 IMPLANT
GLOVE BIOGEL PI IND STRL 7.0 (GLOVE) ×1 IMPLANT
GLOVE BIOGEL PI INDICATOR 7.0 (GLOVE) ×2
GLOVE SURG SS PI 7.0 STRL IVOR (GLOVE) ×3 IMPLANT
GLOVE SURG SS PI 7.5 STRL IVOR (GLOVE) ×3 IMPLANT
GLOVE SURG SS PI 8.0 STRL IVOR (GLOVE) ×6 IMPLANT
GOWN STRL REUS W/TWL XL LVL3 (GOWN DISPOSABLE) ×6 IMPLANT
KIT BASIN OR (CUSTOM PROCEDURE TRAY) ×3 IMPLANT
KIT POSITION SHOULDER SCHLEI (MISCELLANEOUS) ×3 IMPLANT
MANIFOLD NEPTUNE II (INSTRUMENTS) ×3 IMPLANT
NEEDLE HYPO 22GX1.5 SAFETY (NEEDLE) ×3 IMPLANT
NEEDLE SCORPION MULTI FIRE (NEEDLE) IMPLANT
NEEDLE SPNL 18GX3.5 QUINCKE PK (NEEDLE) ×3 IMPLANT
PACK SHOULDER (CUSTOM PROCEDURE TRAY) ×3 IMPLANT
POSITIONER SURGICAL ARM (MISCELLANEOUS) ×3 IMPLANT
SLING ARM IMMOBILIZER LRG (SOFTGOODS) ×3 IMPLANT
SLING ARM IMMOBILIZER MED (SOFTGOODS) IMPLANT
SLING ULTRA II L (ORTHOPEDIC SUPPLIES) IMPLANT
SPONGE LAP 4X18 X RAY DECT (DISPOSABLE) ×3 IMPLANT
STRIP CLOSURE SKIN 1/2X4 (GAUZE/BANDAGES/DRESSINGS) IMPLANT
SUT ETHIBOND NAB CT1 #1 30IN (SUTURE) IMPLANT
SUT ETHILON 4 0 PS 2 18 (SUTURE) IMPLANT
SUT FIBERWIRE #2 38 T-5 BLUE (SUTURE)
SUT PROLENE 3 0 PS 2 (SUTURE) ×3 IMPLANT
SUT TIGER TAPE 7 IN WHITE (SUTURE) IMPLANT
SUT VIC AB 1-0 CT2 27 (SUTURE) ×3 IMPLANT
SUT VIC AB 2-0 CT2 27 (SUTURE) ×3 IMPLANT
SUTURE FIBERWR #2 38 T-5 BLUE (SUTURE) IMPLANT
SYR 10ML LL (SYRINGE) ×3 IMPLANT
TAPE FIBER 2MM 7IN #2 BLUE (SUTURE) ×3 IMPLANT
TOWEL OR 17X26 10 PK STRL BLUE (TOWEL DISPOSABLE) ×3 IMPLANT
TUBING ARTHRO INFLOW-ONLY STRL (TUBING) ×3 IMPLANT
TUBING CONNECTING 10 (TUBING) IMPLANT
TUBING CONNECTING 10' (TUBING)
WAND HAND CNTRL MULTIVAC 90 (MISCELLANEOUS) ×3 IMPLANT

## 2016-12-22 NOTE — H&P (View-Only) (Signed)
Taylor MediciDavid Kidd is an 46 y.o. male.   Chief Complaint: R shoulder pain HPI: The patient is a 46 year old male who presents today for follow up of their shoulder. The patient is being followed for their right shoulder and back pain. They are 4 weeks and 6 days out from injury (DOI 11/02/16). Symptoms reported today include: pain. and report their pain level to be 7 / 10. Current treatment includes: NSAIDs (Naproxen). The following medication has been used for pain control: antiinflammatory medication. The patient presents today following MRI. Note for "Shoulder complaints": The patient was placed on a 10lb. lifting restriction and no overhead work.  patient reports continued pain in the shoulder. No pain prior to this injury.  Past Medical History:  Diagnosis Date  . Heartburn   . Hematuria, unspecified   . Lumbago   . Other abnormal blood chemistry   . Other malaise and fatigue   . Palpitations   . Unspecified essential hypertension   . Unspecified vitamin D deficiency   Thoracic spine pain (M54.6)  Anxiety disorder Chronic pain  Allergies  No Known Drug Allergies [11/23/2016]:  Family History No pertinent family history   Medication History  OxyCODONE HCl (30MG  Tablet, Oral) Active. OxyCONTIN (30MG  Tab 12HR Deter, Oral) Active. MetFORMIN HCl (500MG  Tablet, Oral) Active. ALPRAZolam (0.25MG  Tablet, Oral) Active. (PRN) Testosterone (30MG /ACT Solution, Transdermal) Active. Naproxen (250MG  Tablet, Oral) Active.   Past Surgical History Vasectomy   Social History  Tobacco use  Current every day smoker. 11/23/2016: smoke(d) 1/2 pack(s) per day   reports that he has been smoking Cigarettes.  He has been smoking about 0.50 packs per day. He has never used smokeless tobacco. He reports that he drinks alcohol. He reports that he does not use drugs.  Allergies: No Known Allergies   (Not in a hospital admission)  No results found for this or any previous visit (from the  past 48 hour(s)). No results found.  Review of Systems  Constitutional: Negative.   HENT: Negative.   Eyes: Negative.   Respiratory: Negative.   Cardiovascular: Negative.   Gastrointestinal: Negative.   Genitourinary: Negative.   Musculoskeletal: Positive for joint pain.  Skin: Negative.   Neurological: Negative.   Psychiatric/Behavioral: Negative.     There were no vitals taken for this visit. Physical Exam  Constitutional: He is oriented to person, place, and time. He appears well-developed.  HENT:  Head: Normocephalic.  Eyes: Pupils are equal, round, and reactive to light.  Neck: Normal range of motion.  Cardiovascular: Normal rate.   Respiratory: Effort normal.  GI: Soft.  Musculoskeletal:  Tender in the anterior subacromial region. Positive drop arm sign. Negative Speed's test. Nontender over the before meals. Slightly weak in external rotation. Positive impingement sign. Painless range of motion cervical spine.   Neurological: He is alert and oriented to person, place, and time.  Skin: Skin is warm and dry.    MRI demonstrates a full-thickness tear in the supraspinatus just medial to the footprint  Interstitial tearing of the subscapularis. 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 Some partial tearing of the interest infraspinatus.  Assessment/Plan 1.Small but full-thickness tear of the supraspinatus partial tears of the infraspinatus and the subscapularis. 2. Asymptomatic AC arthrosis.  Plan: Discussed options. Given the symptomatic and unable to increase level of his activity and a full-thickness tear we discussed rotator cuff repair. Arthroscopically assisted. Probable mini open rotator cuff repair with suture anchor. I do not feel that we need to address the [facility].  I had a long discussion with the patient concerning the risks and benefits of a rotator cuff repair, including bleeding, infection, prolonged postoperative recovery, which may require 3 to 5  months until maximum medical improvement. Overight procedure with initiation of early passive range of motion within physical therapy. Avoid any active motion for the first six weeks. This is all in an effort to avoid recurrent tear of the rotator cuff and adhesive capsulitis. Return to work without use of the arm can be obtained following two weeks. However, driving will be a challenge. We also discussed the possibility of requiring implants including bone anchors,as well as an Allograft patch graft if a massive rotator cuff tear is encountered. Removal of any bones for spurs as well as bursitis will be performed during the procedure and also any associated anesthetic complications as well.  We discussed outpatient or overnight. Sutures out in 2 weeks. Physical therapy at max medical improvement 3-5 months postoperatively.  Light duty available at 6 weeks postoperatively.  He does work for [employer]. Continue his light duty in the interim. Otherwise medically clear. Do feel this is related to his work injury.  Plan right shoulder arthroscopy, SAD, mini-open RCR  [name], [name]., PA-C for Dr. [name] 12/13/2016, 10:08 AM

## 2016-12-22 NOTE — Discharge Instructions (Signed)

## 2016-12-22 NOTE — Evaluation (Signed)
Occupational Therapy Evaluation Patient Details Name: Taylor Kidd MRN: [id number] DOB: 03-Oct-1970 Today's Date: 12/22/2016    History of Present Illness s/p R RCR, SAD and release of CA ligament   Clinical Impression   This 46 year old man was admitted for the above sx.  Completed education; pt and wife verbalize understanding of all.  Wife did not don sling, but feels she can.  Pt did not perform pendulums. Will have to see if he can tolerate them due to back pain.  Pt's block was still working at time of evaluation. If pt is here, in the morning, I will check back for these 2 things.    Follow Up Recommendations  DC plan and follow up therapy as arranged by surgeon    Equipment Recommendations  None recommended by OT    Recommendations for Other Services       Precautions / Restrictions Precautions Precautions: Shoulder Type of Shoulder Precautions: sling on except bathing/dressing, AROM elbow to fingers. No AROM Precaution Booklet Issued: Yes (comment) Restrictions Weight Bearing Restrictions: Yes Other Position/Activity Restrictions: NWB      Mobility Bed Mobility Overal bed mobility: Needs Assistance             General bed mobility comments: min A sidelying<>sit to protect arm.    Transfers Overall transfer level: Needs assistance Equipment used: None             General transfer comment: min guard for safety    Balance                                           ADL either performed or assessed with clinical judgement   ADL Overall ADL's : Needs assistance/impaired Eating/Feeding: Set up;Sitting   Grooming: Minimal assistance;Sitting   Upper Body Bathing: Moderate assistance;Sitting   Lower Body Bathing: Moderate assistance;Sit to/from stand   Upper Body Dressing : Maximal assistance;Sitting   Lower Body Dressing: Maximal assistance;Sit to/from stand   Toilet Transfer: Min guard;Ambulation;Comfort height toilet              General ADL Comments: reviewed shoulder protocol, washed pt's arm and reapplied sling.  Wife did not feel she needed to practice this.  Pt has back pain and rolls to get OOB, performed this way. He may not tolerate pendulum exercises. Demonstrated these and told him not to perform if they make back hurt.  He has a standard commdoe at home.  He feels he will be able to get up from it.  Reviewed protocol and gave to pt's wife.  Pt and wife verbalize understanding of all education. See education section.     Vision         Perception     Praxis      Pertinent Vitals/Pain Pain Assessment: No/denies pain (pt with numbness could feel washcloth)     Hand Dominance Right   Extremity/Trunk Assessment Upper Extremity Assessment Upper Extremity Assessment: RUE deficits/detail (immobilized). Block still working; pt unable to move fingers, wrist.  Reviewed that he should do this when able and move elbow up and down against body with palm towards body           Communication Communication Communication: No difficulties   Cognition Arousal/Alertness: Awake/alert Behavior During Therapy: WFL for tasks assessed/performed Overall Cognitive Status: Within Functional Limits for tasks assessed  General Comments       Exercises     Shoulder Instructions      Home Living Family/patient expects to be discharged to:: Private residence Living Arrangements: Spouse/significant other                 Bathroom Shower/Tub: Chief Strategy OfficerTub/shower unit   Bathroom Toilet: Standard                Prior Functioning/Environment Level of Independence: Independent                 OT Problem List: Decreased strength;Decreased knowledge of precautions      OT Treatment/Interventions: Self-care/ADL training;DME and/or AE instruction;Patient/family education    OT Goals(Current goals can be found in the care plan section) Acute Rehab  OT Goals Patient Stated Goal: recover and return to work OT Goal Formulation: With patient Time For Goal Achievement: 12/23/16 Potential to Achieve Goals: Good ADL Goals Additional ADL Goal #1: wife will independently don and adjust sling Additional ADL Goal #2: pt will perform pendulums as able with supervision  OT Frequency: Min 1X/week   Barriers to D/C:            Co-evaluation              AM-PAC PT "6 Clicks" Daily Activity     Outcome Measure Help from another person eating meals?: A Little Help from another person taking care of personal grooming?: A Little Help from another person toileting, which includes using toliet, bedpan, or urinal?: A Little Help from another person bathing (including washing, rinsing, drying)?: A Lot Help from another person to put on and taking off regular upper body clothing?: A Lot Help from another person to put on and taking off regular lower body clothing?: A Lot 6 Click Score: 15   End of Session Nurse Communication:  (OT evaluation performed)  Activity Tolerance: Patient tolerated treatment well Patient left: in bed;with call bell/phone within reach;with family/visitor present  OT Visit Diagnosis: Muscle weakness (generalized) (M62.81)                Time: [id number] OT Time Calculation (min): 29 min Charges:  OT General Charges $OT Visit: 1 Visit OT Evaluation $OT Eval Low Complexity: 1 Low OT Treatments $Self Care/Home Management : 8-22 mins G-Codes: OT G-codes **NOT FOR INPATIENT CLASS** Functional Assessment Tool Used: Clinical judgement Functional Limitation: Self care Self Care Current Status (Q6578(G8987): At least 60 percent but less than 80 percent impaired, limited or restricted Self Care Goal Status (I6962(G8988): At least 60 percent but less than 80 percent impaired, limited or restricted   Marica OtterMaryellen Blaze Sandin, OTR/L [phone] 12/22/2016  Skilynn Durney 12/22/2016, 4:14 PM

## 2016-12-22 NOTE — Anesthesia Procedure Notes (Signed)
Anesthesia Regional Block: Supraclavicular block   Pre-Anesthetic Checklist: ,, timeout performed, Correct Patient, Correct Site, Correct Laterality, Correct Procedure, Correct Position, site marked, Risks and benefits discussed,  Surgical consent,  Pre-op evaluation,  At surgeon's request and post-op pain management  Laterality: Right  Prep: chloraprep       Needles:  Injection technique: Single-shot  Needle Type: Echogenic Stimulator Needle     Needle Length: 9cm  Needle Gauge: 21     Additional Needles:   Procedures:, nerve stimulator,,,,,,,   Nerve Stimulator or Paresthesia:  Response: 0.4 mA,   Additional Responses:   Narrative:  Start time: 12/22/2016 7:15 AM End time: 12/22/2016 7:25 AM Injection made incrementally with aspirations every 5 mL.  Performed by: Personally  Anesthesiologist: Arta BruceSSEY, Benjermin Korber  Additional Notes: Monitors applied. Patient sedated. Sterile prep and drape,hand hygiene and sterile gloves were used. Relevant anatomy identified.Needle position confirmed.Local anesthetic injected incrementally after negative aspiration. Local anesthetic spread visualized around nerve(s). Vascular puncture avoided. No complications. Image printed for medical record.The patient tolerated the procedure well.

## 2016-12-22 NOTE — Transfer of Care (Signed)
Immediate Anesthesia Transfer of Care Note  Patient: Taylor MediciDavid Kidd  Procedure(s) Performed: Right shoulder arthroscopy, subacromial decompression, mini open rotator cuff repair (Right Shoulder)  Patient Location: PACU  Anesthesia Type:GA combined with regional for post-op pain  Level of Consciousness: awake  Airway & Oxygen Therapy: Patient Spontanous Breathing and Patient connected to face mask oxygen  Post-op Assessment: Report given to RN and Post -op Vital signs reviewed and stable  Post vital signs: Reviewed and stable  Last Vitals: There were no vitals filed for this visit.  Last Pain: There were no vitals filed for this visit.    Patients Stated Pain Goal: 4 (12/22/16 [id number])  Complications: No apparent anesthesia complications

## 2016-12-22 NOTE — Interval H&P Note (Signed)
History and Physical Interval Note:  12/22/2016 7:14 AM  Taylor Kidd  has presented today for surgery, with the diagnosis of Rotator cuff tear right shoulder  The various methods of treatment have been discussed with the patient and family. After consideration of risks, benefits and other options for treatment, the patient has consented to  Procedure(s) with comments: Right shoulder arthroscopy, subacromial decompression, mini open rotator cuff repair (Right) - 120 mins as a surgical intervention .  The patient's history has been reviewed, patient examined, no change in status, stable for surgery.  I have reviewed the patient's chart and labs.  Questions were answered to the patient's satisfaction.     Arush Gatliff C

## 2016-12-22 NOTE — Progress Notes (Signed)
Patient requested me to ask Dr.Beane if he could still be discharged today. Tolerating pain, solids, fluids well. No complaints at this time. Dr.Beane said it was fine to discharge patient home. Prescriptions given, IV removed, education reviewed.

## 2016-12-22 NOTE — Anesthesia Postprocedure Evaluation (Signed)
Anesthesia Post Note  Patient: Taylor MediciDavid Kidd  Procedure(s) Performed: Right shoulder arthroscopy, subacromial decompression, mini open rotator cuff repair (Right Shoulder)     Patient location during evaluation: PACU Anesthesia Type: General Level of consciousness: awake and alert Pain management: pain level controlled Vital Signs Assessment: post-procedure vital signs reviewed and stable Respiratory status: spontaneous breathing, nonlabored ventilation, respiratory function stable and patient connected to nasal cannula oxygen Cardiovascular status: blood pressure returned to baseline and stable Postop Assessment: no apparent nausea or vomiting Anesthetic complications: no    Last Vitals:  Vitals:   12/22/16 1100 12/22/16 1150  BP:  137/63  Pulse: 91 89  Resp:  14  Temp:  36.9 C  SpO2: 96% 98%    Last Pain:  Vitals:   12/22/16 1251  TempSrc:   PainSc: 2                  Taylor Kidd

## 2016-12-22 NOTE — Brief Op Note (Signed)
12/22/2016  9:08 AM  PATIENT:  Taylor Kidd  [age] y.o. male  PRE-OPERATIVE DIAGNOSIS:  Rotator cuff tear right shoulder  POST-OPERATIVE DIAGNOSIS:  Rotator cuff tear right shoulder  PROCEDURE:  Procedure(s) with comments: Right shoulder arthroscopy, subacromial decompression, mini open rotator cuff repair (Right) - 120 mins  SURGEON:  Surgeon(s) and Role:    Jene Every* Sarvesh Meddaugh, MD - Primary  PHYSICIAN ASSISTANT:   ASSISTANTS: Bissell   ANESTHESIA:   general  EBL:  50 mL   BLOOD ADMINISTERED:none  DRAINS: none   LOCAL MEDICATIONS USED:  MARCAINE     SPECIMEN:  No Specimen  DISPOSITION OF SPECIMEN:  N/A  COUNTS:  YES  TOURNIQUET:  * No tourniquets in log *  DICTATION: .Other Dictation: Dictation Number [id number]  PLAN OF CARE: Admit for overnight observation  PATIENT DISPOSITION:  PACU - hemodynamically stable.   Delay start of Pharmacological VTE agent (>24hrs) due to surgical blood loss or risk of bleeding: yes

## 2016-12-22 NOTE — Op Note (Signed)
NAME:  Luberto, Christpoher                    ACCOUNT NO.:  MEDICAL RECORD NO.:  [id number]  LOCATION:                                 FACILITY:  PHYSICIAN:  Jene EveryJeffrey Judyth Demarais, M.D.         DATE OF BIRTH:  DATE OF PROCEDURE: DATE OF DISCHARGE:                              OPERATIVE REPORT   PREOPERATIVE DIAGNOSIS:  Right rotator cuff tear.  POSTOPERATIVE DIAGNOSIS:  Right rotator cuff tear.  PROCEDURE PERFORMED: 1. Shoulder arthroscopy. 2. Subacromial decompression acromioplasty, release of CA ligament. 3. Mini open rotator cuff repair of the supraspinatus utilizing a     single PEEK suture anchor and FiberTape.  ANESTHESIA:  General.  ASSISTANT:  Lanna PocheJacqueline Bissell, PA.  HISTORY:  A 46 year old, injured himself at work, had a full-thickness tear noted on his rotator cuff.  This was by MRI and the supraspinatus was not retracted.  It was indicated for repair.  He had a minor type II acromion.  He had a fairly short acromion.  He is indicated for arthroscopic confirmation, decompression, and mini open rotator cuff repair.  Risks and benefits were discussed including bleeding, infection, damage to neurovascular structures, no change in symptoms, worsening symptoms, DVT, PE, anesthetic complications, etc.  TECHNIQUE:  With the patient in supine beach chair position, after induction of adequate general anesthesia, 2 g of Kefzol, the right shoulder and upper extremity were prepped and draped in usual sterile fashion.  Exam under anesthesia revealed full range.  Surgical marker was utilized to delineate the acromion, AC joint, and coracoid.  Utilized a posterolateral portal and an incision through the skin only with a #11 blade.  With the arm in the 0-30 position, we advanced the arthroscopic camera in the subacromial joint, lysing adhesions.  An anterolateral portal was fashioned with a #11 blade in a standard fashion through the skin only.  We triangulated in the subacromial space.   Full inspection revealed a hypertrophic bursa. Introduced and performed a full bursectomy.  He had a fairly tight CA ligament noted.  This was released with an ArthroWand, improving the space, small shaving on the anterolateral aspect of the acromion.  The tear was noted in the supraspinatus just into the greater tuberosity in the mid posterior portion of that.  This was probed.  It was felt it was greater than 50%.  It was decided to proceed with a mini open rotator cuff repair.  No impingement from his AC joint.  He had no pathology in the glenohumeral joint, therefore we did not proceed with evaluation of the glenohumeral joint.  Next, I removed all arthroscopic cameras, closed the portals with 3-0 nylon in simple sutures, and then converted to a mini open rotator cuff. An incision was made of the anterior lateral aspect of the acromion. Due to this fairly short acromion, it was made just to the distal aspect of the anterolateral corner.  Subcutaneous tissue was dissected. Electrocautery was utilized to achieve hemostasis.  The rhaphe between the anterolateral heads of the deltoid was developed.  He had a fairly robust deltoid musculature.  We identified the anterolateral corner of the acromion and the greater tuberosity.  I then used a Charnley retractor for self retaining.  We identified again the tear.  It was kind of a Y-shaped tear longitudinal and then breaking back towards the posterior aspect of the supraspinatus.  It was not detached and retracted.  Incised and debrided the cuff.  I passed 2 FiberTape leaflets along the supraspinatus posteriorly and medially.  Crossed those over the edge of the greater tuberosity and secured them into a pilot hole with a SwiveLock.  I used an awl excellent bone, inserted the leaflets into the SwiveLock and without undue tension, secured it within the SwiveLock.  This completely closed the gap on the tear and full coverage.  The remainder of  the cuff was unremarkable.  Copiously irrigated the wound, closed the raphae with 1 Vicryl, subcu with 2-0, and skin with Prolene.  Sterile dressing applied.  Placed in a sling, extubated without difficulty, and transported to the recovery room in satisfactory condition.  The patient tolerated the procedure well.  No complications.  BLOOD LOSS:  Minimal.     Jene Every, M.D.     JB/MEDQ  D:  12/22/2016  T:  12/22/2016  Job:  [id number]

## 2016-12-22 NOTE — Anesthesia Procedure Notes (Signed)
Date/Time: 12/22/2016 7:05 AM Performed by: Leroy LibmanEARDON, Racer Quam L Oxygen Delivery Method: Nasal cannula

## 2016-12-22 NOTE — Anesthesia Procedure Notes (Signed)
Procedure Name: Intubation Date/Time: 12/22/2016 7:47 AM Performed by: Taylor Kidd, Taylor Kidd Patient Re-evaluated:Patient Re-evaluated prior to induction Oxygen Delivery Method: Circle system utilized Preoxygenation: Pre-oxygenation with 100% oxygen Induction Type: IV induction Ventilation: Mask ventilation without difficulty and Oral airway inserted - appropriate to patient size Laryngoscope Size: Miller and 3 Grade View: Grade I Tube type: Oral Tube size: 8.0 mm Number of attempts: 1 Airway Equipment and Method: Stylet Placement Confirmation: ETT inserted through vocal cords under direct vision,  positive ETCO2 and breath sounds checked- equal and bilateral Secured at: 23 cm Tube secured with: Tape Dental Injury: Teeth and Oropharynx as per pre-operative assessment

## 2016-12-22 NOTE — Anesthesia Preprocedure Evaluation (Addendum)
Anesthesia Evaluation  Patient identified by MRN, date of birth, ID band Patient awake    Reviewed: Allergy & Precautions, NPO status , Patient's Chart, lab work & pertinent test results  Airway Mallampati: I  TM Distance: >3 FB Neck ROM: Full    Dental   Pulmonary Current Smoker,    Pulmonary exam normal        Cardiovascular Normal cardiovascular exam     Neuro/Psych Anxiety Depression    GI/Hepatic GERD  Medicated and Controlled,  Endo/Other  diabetes, Type 2, Oral Hypoglycemic Agents  Renal/GU      Musculoskeletal   Abdominal   Peds  Hematology   Anesthesia Other Findings   Reproductive/Obstetrics                            Anesthesia Physical Anesthesia Plan  ASA: II  Anesthesia Plan: General   Post-op Pain Management:  Regional for Post-op pain   Induction: Intravenous  PONV Risk Score and Plan: 1 and Ondansetron and Dexamethasone  Airway Management Planned: Oral ETT  Additional Equipment:   Intra-op Plan:   Post-operative Plan: Extubation in OR  Informed Consent: I have reviewed the patients History and Physical, chart, labs and discussed the procedure including the risks, benefits and alternatives for the proposed anesthesia with the patient or authorized representative who has indicated his/her understanding and acceptance.     Plan Discussed with: CRNA and Surgeon  Anesthesia Plan Comments:         Anesthesia Quick Evaluation

## 2016-12-23 ENCOUNTER — Encounter (HOSPITAL_COMMUNITY): Payer: Self-pay | Admitting: Specialist

## 2016-12-23 ENCOUNTER — Other Ambulatory Visit: Payer: Self-pay | Admitting: *Deleted

## 2016-12-23 DIAGNOSIS — G894 Chronic pain syndrome: Secondary | ICD-10-CM

## 2016-12-23 NOTE — Telephone Encounter (Signed)
Ok to fill? Pt just had surgery yesterday, please advise, last filled 11/21/2016

## 2016-12-23 NOTE — Telephone Encounter (Signed)
Spoke with patient and advised results   

## 2016-12-23 NOTE — Telephone Encounter (Signed)
Based on the [place] database and our system he has gotten this filled early (due to weekends) the last 3 months, therefore he should have enough tablets to last. His next refill will be given on 12/26/16 (this was last filled 11/25/16, aware this is a 31 day month) but based on the extras he has had from getting this early over the last 3 months he should have enough to last him

## 2016-12-26 ENCOUNTER — Other Ambulatory Visit: Payer: Self-pay

## 2016-12-26 DIAGNOSIS — G894 Chronic pain syndrome: Secondary | ICD-10-CM

## 2016-12-26 MED ORDER — OXYCODONE HCL ER 30 MG PO T12A: EXTENDED_RELEASE_TABLET | ORAL | 0 refills | Status: DC

## 2016-12-26 NOTE — Telephone Encounter (Signed)
NCarolina Narx checked 

## 2016-12-27 ENCOUNTER — Telehealth: Payer: Self-pay

## 2016-12-27 NOTE — Telephone Encounter (Signed)
In coming fax from [facility] indicating starting Feb 21, 2017 to help ensure safe use of opoid pain management medications some opoid pain products will be subject to utilization management changes.   Prescriptions for patient's already receiving opoid therapy at the pharmacy will reject at the pharmacy if patient's therapy is not adjusted to be at or below the specified limit.  Full report placed in Dr.Reed's review and sign folder

## 2017-01-17 ENCOUNTER — Encounter: Payer: Self-pay | Admitting: Nurse Practitioner

## 2017-01-17 ENCOUNTER — Ambulatory Visit (INDEPENDENT_AMBULATORY_CARE_PROVIDER_SITE_OTHER): Payer: BLUE CROSS/BLUE SHIELD | Admitting: Nurse Practitioner

## 2017-01-17 VITALS — BP 138/88 | HR 88 | Temp 98.1°F | Resp 17 | Ht 75.0 in | Wt 260.0 lb

## 2017-01-17 DIAGNOSIS — G894 Chronic pain syndrome: Secondary | ICD-10-CM | POA: Diagnosis not present

## 2017-01-17 DIAGNOSIS — R7989 Other specified abnormal findings of blood chemistry: Secondary | ICD-10-CM | POA: Diagnosis not present

## 2017-01-17 DIAGNOSIS — E559 Vitamin D deficiency, unspecified: Secondary | ICD-10-CM | POA: Diagnosis not present

## 2017-01-17 DIAGNOSIS — R739 Hyperglycemia, unspecified: Secondary | ICD-10-CM | POA: Diagnosis not present

## 2017-01-17 DIAGNOSIS — E669 Obesity, unspecified: Secondary | ICD-10-CM | POA: Diagnosis not present

## 2017-01-17 DIAGNOSIS — E782 Mixed hyperlipidemia: Secondary | ICD-10-CM | POA: Diagnosis not present

## 2017-01-17 DIAGNOSIS — S43421D Sprain of right rotator cuff capsule, subsequent encounter: Secondary | ICD-10-CM | POA: Diagnosis not present

## 2017-01-17 MED ORDER — OXYCODONE HCL 30 MG PO TABS: ORAL_TABLET | ORAL | 0 refills | Status: DC

## 2017-01-17 NOTE — Patient Instructions (Addendum)
Important that you make diet changes Increase activity- 30 mins 5 days a week of cardiovascular activity  To get fasting blood work when in office next.- please make sure to schedule lab appt   DASH Eating Plan DASH stands for "Dietary Approaches to Stop Hypertension." The DASH eating plan is a healthy eating plan that has been shown to reduce high blood pressure (hypertension). It may also reduce your risk for type 2 diabetes, heart disease, and stroke. The DASH eating plan may also help with weight loss. What are tips for following this plan? General guidelines  Avoid eating more than 2,300 mg (milligrams) of salt (sodium) a day. If you have hypertension, you may need to reduce your sodium intake to 1,500 mg a day.  Limit alcohol intake to no more than 1 drink a day for nonpregnant women and 2 drinks a day for men. One drink equals 12 oz of beer, 5 oz of wine, or 1 oz of hard liquor.  Work with your health care provider to maintain a healthy body weight or to lose weight. Ask what an ideal weight is for you.  Get at least 30 minutes of exercise that causes your heart to beat faster (aerobic exercise) most days of the week. Activities may include walking, swimming, or biking.  Work with your health care provider or diet and nutrition specialist (dietitian) to adjust your eating plan to your individual calorie needs. Reading food labels  Check food labels for the amount of sodium per serving. Choose foods with less than 5 percent of the Daily Value of sodium. Generally, foods with less than 300 mg of sodium per serving fit into this eating plan.  To find whole grains, look for the word "whole" as the first word in the ingredient list. Shopping  Buy products labeled as "low-sodium" or "no salt added."  Buy fresh foods. Avoid canned foods and premade or frozen meals. Cooking  Avoid adding salt when cooking. Use salt-free seasonings or herbs instead of table salt or sea salt. Check with  your health care provider or pharmacist before using salt substitutes.  Do not fry foods. Cook foods using healthy methods such as baking, boiling, grilling, and broiling instead.  Cook with heart-healthy oils, such as olive, canola, soybean, or sunflower oil. Meal planning   Eat a balanced diet that includes: ? 5 or more servings of fruits and vegetables each day. At each meal, try to fill half of your plate with fruits and vegetables. ? Up to 6-8 servings of whole grains each day. ? Less than 6 oz of lean meat, poultry, or fish each day. A 3-oz serving of meat is about the same size as a deck of cards. One egg equals 1 oz. ? 2 servings of low-fat dairy each day. ? A serving of nuts, seeds, or beans 5 times each week. ? Heart-healthy fats. Healthy fats called Omega-3 fatty acids are found in foods such as flaxseeds and coldwater fish, like sardines, salmon, and mackerel.  Limit how much you eat of the following: ? Canned or prepackaged foods. ? Food that is high in trans fat, such as fried foods. ? Food that is high in saturated fat, such as fatty meat. ? Sweets, desserts, sugary drinks, and other foods with added sugar. ? Full-fat dairy products.  Do not salt foods before eating.  Try to eat at least 2 vegetarian meals each week.  Eat more home-cooked food and less restaurant, buffet, and fast food.  When eating  at a restaurant, ask that your food be prepared with less salt or no salt, if possible. What foods are recommended? The items listed may not be a complete list. Talk with your dietitian about what dietary choices are best for you. Grains Whole-grain or whole-wheat bread. Whole-grain or whole-wheat pasta. Brown rice. Modena Morrow. Bulgur. Whole-grain and low-sodium cereals. Pita bread. Low-fat, low-sodium crackers. Whole-wheat flour tortillas. Vegetables Fresh or frozen vegetables (raw, steamed, roasted, or grilled). Low-sodium or reduced-sodium tomato and vegetable  juice. Low-sodium or reduced-sodium tomato sauce and tomato paste. Low-sodium or reduced-sodium canned vegetables. Fruits All fresh, dried, or frozen fruit. Canned fruit in natural juice (without added sugar). Meat and other protein foods Skinless chicken or [place]. Ground chicken or [place]. Pork with fat trimmed off. Fish and seafood. Egg whites. Dried beans, peas, or lentils. Unsalted nuts, nut butters, and seeds. Unsalted canned beans. Lean cuts of beef with fat trimmed off. Low-sodium, lean deli meat. Dairy Low-fat (1%) or fat-free (skim) milk. Fat-free, low-fat, or reduced-fat cheeses. Nonfat, low-sodium ricotta or cottage cheese. Low-fat or nonfat yogurt. Low-fat, low-sodium cheese. Fats and oils Soft margarine without trans fats. Vegetable oil. Low-fat, reduced-fat, or light mayonnaise and salad dressings (reduced-sodium). Canola, safflower, olive, soybean, and sunflower oils. Avocado. Seasoning and other foods Herbs. Spices. Seasoning mixes without salt. Unsalted popcorn and pretzels. Fat-free sweets. What foods are not recommended? The items listed may not be a complete list. Talk with your dietitian about what dietary choices are best for you. Grains Baked goods made with fat, such as croissants, muffins, or some breads. Dry pasta or rice meal packs. Vegetables Creamed or fried vegetables. Vegetables in a cheese sauce. Regular canned vegetables (not low-sodium or reduced-sodium). Regular canned tomato sauce and paste (not low-sodium or reduced-sodium). Regular tomato and vegetable juice (not low-sodium or reduced-sodium). Angie Fava. Olives. Fruits Canned fruit in a light or heavy syrup. Fried fruit. Fruit in cream or butter sauce. Meat and other protein foods Fatty cuts of meat. Ribs. Fried meat. Berniece Salines. Sausage. Bologna and other processed lunch meats. Salami. Fatback. Hotdogs. Bratwurst. Salted nuts and seeds. Canned beans with added salt. Canned or smoked fish. Whole eggs or egg yolks.  Chicken or [place] with skin. Dairy Whole or 2% milk, cream, and half-and-half. Whole or full-fat cream cheese. Whole-fat or sweetened yogurt. Full-fat cheese. Nondairy creamers. Whipped toppings. Processed cheese and cheese spreads. Fats and oils Butter. Stick margarine. Lard. Shortening. Ghee. Bacon fat. Tropical oils, such as coconut, palm kernel, or palm oil. Seasoning and other foods Salted popcorn and pretzels. Onion salt, garlic salt, seasoned salt, table salt, and sea salt. Worcestershire sauce. Tartar sauce. Barbecue sauce. Teriyaki sauce. Soy sauce, including reduced-sodium. Steak sauce. Canned and packaged gravies. Fish sauce. Oyster sauce. Cocktail sauce. Horseradish that you find on the shelf. Ketchup. Mustard. Meat flavorings and tenderizers. Bouillon cubes. Hot sauce and Tabasco sauce. Premade or packaged marinades. Premade or packaged taco seasonings. Relishes. Regular salad dressings. Where to find more information:  National Heart, Lung, and [place]: https://wilson-eaton.com/  American Heart Association: www.heart.org Summary  The DASH eating plan is a healthy eating plan that has been shown to reduce high blood pressure (hypertension). It may also reduce your risk for type 2 diabetes, heart disease, and stroke.  With the DASH eating plan, you should limit salt (sodium) intake to 2,300 mg a day. If you have hypertension, you may need to reduce your sodium intake to 1,500 mg a day.  When on the DASH eating plan, aim to  eat more fresh fruits and vegetables, whole grains, lean proteins, low-fat dairy, and heart-healthy fats.  Work with your health care provider or diet and nutrition specialist (dietitian) to adjust your eating plan to your individual calorie needs. This information is not intended to replace advice given to you by your health care provider. Make sure you discuss any questions you have with your health care provider. Document Released: 01/27/2011 Document Revised:  02/01/2016 Document Reviewed: 02/01/2016 Elsevier Interactive Patient Education  2017 [employer].

## 2017-01-17 NOTE — Progress Notes (Signed)
Careteam: Patient Care Team: Sharon Seller, NP as PCP - General (Geriatric Medicine) Sheran Luz, MD as Consulting Physician (Physical Medicine and Rehabilitation) Pati Gallo, MD as Consulting Physician (Sports Medicine)  Advanced Directive information Does Patient Have a Medical Advance Directive?: No  No Known Allergies  Chief Complaint  Patient presents with  . Medical Management of Chronic Issues    Pt is being seen for a 2 month routine visit. Pt reports no concerns.   . Other    Pt had surgery to repair rotator cuff on 12/22/16 by Dr. Jillyn [facility] at [facility]     HPI: Patient is a 46 y.o. male seen in the office today for routine follow up.  Had rotator cuff repair done at the beginning of month. Doing PT a few times a week. Has not gone back to work- out 4-6 months due to type of work. Has been on robaxin and Toradol in addition to oxycodone IR and ER.  Still requiring robaxin occasionally at night due to increase in spasms which he reports he is taking 4-5 times a night.   Has attempted to cut back on oxycodone IR to taking every 6 hours.    Review of Systems:  Review of Systems  Constitutional: Negative for chills, fever and weight loss.  Respiratory: Negative for shortness of breath.   Cardiovascular: Negative for chest pain, palpitations and leg swelling.  Gastrointestinal: Positive for constipation (controlled with colace). Negative for abdominal pain, diarrhea and heartburn.  Musculoskeletal: Positive for back pain and joint pain (shoulder pain). Negative for falls and myalgias.  Skin: Positive for itching and rash.  Neurological: Negative for dizziness, tremors, sensory change, loss of consciousness, weakness and headaches.  Psychiatric/Behavioral: Negative for depression and memory loss. The patient is not nervous/anxious and does not have insomnia.     Past Medical History:  Diagnosis Date  . Anxiety   . Arthritis   . Depression   . Diabetes  mellitus without complication (HCC)   . GERD (gastroesophageal reflux disease)   . Heartburn   . Hematuria, unspecified   . Lumbago   . Other abnormal blood chemistry   . Other malaise and fatigue   . Palpitations   . Unspecified vitamin D deficiency    Past Surgical History:  Procedure Laterality Date  . SHOULDER ARTHROSCOPY WITH ROTATOR CUFF REPAIR AND SUBACROMIAL DECOMPRESSION Right 12/22/2016   Procedure: Right shoulder arthroscopy, subacromial decompression, mini open rotator cuff repair;  Surgeon: Jene Every, MD;  Location: [facility];  Service: Orthopedics;  Laterality: Right;  120 mins   Social History:   reports that he has been smoking cigarettes.  He has a 15.00 pack-year smoking history. he has never used smokeless tobacco. He reports that he drinks alcohol. He reports that he does not use drugs.  History reviewed. No pertinent family history.  Medications:   Medication List        Accurate as of 01/17/17  2:33 PM. Always use your most recent med list.          docusate sodium 100 MG capsule Commonly known as:  COLACE Take 1 capsule (100 mg total) by mouth 2 (two) times daily as needed for mild constipation.   metFORMIN 500 MG tablet Commonly known as:  GLUCOPHAGE Take one with breakfast each morning to help control blood sugar   methocarbamol 500 MG tablet Commonly known as:  ROBAXIN Take 1 tablet (500 mg total) by mouth every 6 (six) hours as needed for  muscle spasms.   MULTIVITAMIN PO   * oxycodone 30 MG immediate release tablet Commonly known as:  ROXICODONE Take one tablet every 4 hours daily as needed for pain   * oxyCODONE 30 MG 12 hr tablet Take One tablet by mouth every 12 hours to relieve pain   polyethylene glycol packet Commonly known as:  MIRALAX / GLYCOLAX Take 17 g by mouth daily.   Testosterone 30 MG/ACT Soln Apply one pump under the arm daily   Vitamin D3 5000 units Caps Take one tablet by mouth Monday, Wednesday and Friday for  Vitamin D Supplement.      * This list has 2 medication(s) that are the same as other medications prescribed for you. Read the directions carefully, and ask your doctor or other care provider to review them with you.           Physical Exam:  Vitals:   01/17/17 1427  BP: 138/88  Pulse: 88  Resp: 17  Temp: 98.1 F (36.7 C)  TempSrc: Oral  SpO2: 98%  Weight: 260 lb (117.9 kg)  Height: 6\' 3"  (1.905 m)   Body mass index is 32.5 kg/m.  Physical Exam  Constitutional: He is oriented to person, place, and time. He appears well-developed and well-nourished. No distress.  HENT:  Right Ear: External ear normal.  Nose: Nose normal.  Mouth/Throat: No oropharyngeal exudate.  Eyes: Conjunctivae and EOM are normal. Pupils are equal, round, and reactive to light.  Cardiovascular: Normal rate, regular rhythm and normal heart sounds. Exam reveals no gallop.  Pulmonary/Chest: Effort normal and breath sounds normal.  Abdominal: Soft. Bowel sounds are normal.  Musculoskeletal: Normal range of motion. He exhibits no edema.  Right arm in sling, limited ROM to right shoulder  Neurological: He is alert and oriented to person, place, and time. He has normal reflexes. No cranial nerve deficit. Coordination normal.  Skin: Skin is warm and dry.  Psychiatric: He has a normal mood and affect. His behavior is normal.    Labs reviewed: Basic Metabolic Panel: Recent Labs    09/23/16 0929 12/20/16 1329  NA 137 139  K 4.0 4.2  CL 100 103  CO2 24 27  GLUCOSE 102* 93  BUN 13 16  CREATININE 0.86 0.83  CALCIUM 9.3 9.6   Liver Function Tests: Recent Labs    09/23/16 0929  AST 26  ALT 24  ALKPHOS 75  BILITOT 0.4  PROT 6.5  ALBUMIN 3.9   No results for input(s): LIPASE, AMYLASE in the last 8760 hours. No results for input(s): AMMONIA in the last 8760 hours. CBC: Recent Labs    11/14/16 1545 12/20/16 1329  WBC 11.9* 10.0  NEUTROABS 8,830*  --   HGB 16.5 15.2  HCT 48.8 44.2  MCV  79.6* 79.6  PLT 272 248   Lipid Panel: No results for input(s): CHOL, HDL, LDLCALC, TRIG, CHOLHDL, LDLDIRECT in the last 8760 hours. TSH: No results for input(s): TSH in the last 8760 hours. A1C: Lab Results  Component Value Date   HGBA1C 5.7 (H) 12/20/2016     Assessment/Plan 1. Sprain of right rotator cuff capsule, subsequent encounter S/p repair following with orthopedic and PT which is going well. Plans to be out of work for several month due to nature of work.  2. Chronic pain syndrome -dose reduction of oxycodone being made. He has cut back on oxycodone to 4 tablets a day with overall success, has some bad days. We his Rx will be filled next  to reflect this.   Also plan and discussed to decrease his oxycodone to 15 mg 12 hour tablet which we will do the following month.  - oxycodone (ROXICODONE) 30 MG immediate release tablet; Take one tablet every 6 hours daily as needed for pain  Dispense: 120 tablet; Refill: 0  3. Low testosterone Has been off testosterone since surgery.   4. Hyperglycemia conts on metformin, A1c consistent with with prediabetes, not in diabetic range at this time but trending up. Discussed diet and exercise modifications.   5. Vitamin D deficiency Vit d stable on recent labs, cont supplement  6. Mixed hyperlipidemia LDL was elevated last year when lipid were checked and he has not made any dietary modifications, discussed in detail about need to change lifestyle. Diet information provided and encouraged routine exercise.  - Lipid Panel; Future  7. Obesity (BMI 30-39.9) -weight gain since surgery, encouraged lifestyle modifications.   Next appt: 3 months. Janene HarveyJessica K. Biagio BorgEubanks, AGNP  [facility] [phone](Monday-Friday 8 am - 5 pm) [phone] (after hours)

## 2017-01-20 ENCOUNTER — Other Ambulatory Visit: Payer: Self-pay

## 2017-01-20 NOTE — Telephone Encounter (Signed)
Patient called requesting refill on both Oxycodone meds, he stated that he will be in the office on Monday 01/23/17 at 9:30am for blood draw and wanted to know if he could pick up at that time.  Please advise.

## 2017-01-22 NOTE — Telephone Encounter (Signed)
He is only due to get the oxy IR, Oxycodone ER is not due until 12/5

## 2017-01-23 ENCOUNTER — Other Ambulatory Visit: Payer: BLUE CROSS/BLUE SHIELD

## 2017-01-23 ENCOUNTER — Other Ambulatory Visit: Payer: Self-pay | Admitting: *Deleted

## 2017-01-23 DIAGNOSIS — E782 Mixed hyperlipidemia: Secondary | ICD-10-CM

## 2017-01-23 DIAGNOSIS — G894 Chronic pain syndrome: Secondary | ICD-10-CM

## 2017-01-23 LAB — LIPID PANEL
Cholesterol: 210 mg/dL — ABNORMAL HIGH (ref <200)
HDL: 50 mg/dL (ref >40)
LDL CHOLESTEROL (CALC): 128 mg/dL — AB
Non-HDL Cholesterol (Calc): 160 mg/dL (calc) — ABNORMAL HIGH (ref <130)
TRIGLYCERIDES: 181 mg/dL — AB (ref <150)
Total CHOL/HDL Ratio: 4.2 (calc) (ref <5.0)

## 2017-01-23 MED ORDER — OXYCODONE HCL 30 MG PO TABS: ORAL_TABLET | ORAL | 0 refills | Status: DC

## 2017-01-23 NOTE — Telephone Encounter (Signed)
Rx was printed again with different signing provider.

## 2017-01-23 NOTE — Addendum Note (Signed)
Addended by: Chriss DriverLANE, TERRY L on: 01/23/2017 09:16 AM   Modules accepted: Orders

## 2017-01-23 NOTE — Telephone Encounter (Signed)
Rx printed and patient will pick up NCCSRS Database Verified.

## 2017-01-23 NOTE — Telephone Encounter (Signed)
Rx printed per Shanda BumpsJessica and placed in Jessica's folder to sign. Patient will pick up. NCCSRS Database Verified.

## 2017-01-25 ENCOUNTER — Other Ambulatory Visit: Payer: Self-pay | Admitting: *Deleted

## 2017-01-25 DIAGNOSIS — G894 Chronic pain syndrome: Secondary | ICD-10-CM

## 2017-01-25 MED ORDER — OXYCODONE HCL ER 30 MG PO T12A: EXTENDED_RELEASE_TABLET | ORAL | 0 refills | Status: DC

## 2017-01-25 NOTE — Telephone Encounter (Signed)
Patient requested and will pick up [place]CC[place] Database Verified.  

## 2017-02-24 ENCOUNTER — Other Ambulatory Visit: Payer: Self-pay | Admitting: *Deleted

## 2017-02-24 DIAGNOSIS — G894 Chronic pain syndrome: Secondary | ICD-10-CM

## 2017-02-24 MED ORDER — OXYCODONE HCL 30 MG PO TABS: ORAL_TABLET | ORAL | 0 refills | Status: DC

## 2017-02-24 MED ORDER — OXYCODONE HCL ER 15 MG PO T12A: 15.0000 mg | EXTENDED_RELEASE_TABLET | Freq: Two times a day (BID) | ORAL | 0 refills | Status: DC

## 2017-02-24 NOTE — Telephone Encounter (Signed)
Patient requested NCCSRS Database Verified Pharmacy Confirmed [facility]

## 2017-02-24 NOTE — Telephone Encounter (Signed)
rx sent to pharmacy Oxycontin 12 hour tablet has been reduced to 15 mg from 30 mg as discussed at last office visit. Plan to reduce oxy IR from 30 mg to 15 mg at next Rx refill.

## 2017-02-28 ENCOUNTER — Telehealth: Payer: Self-pay | Admitting: *Deleted

## 2017-02-28 NOTE — Telephone Encounter (Signed)
Received prior authorization from CoverMyMeds for patient's Oxycodone #120. Initiated through [employer] website. Sent to Plan and awaiting Questionnaire which stated it would be faxed.   ZOX:WRUEAVKey:[id number] WU#:[id number]#:[id number]

## 2017-03-01 NOTE — Telephone Encounter (Signed)
Checked on Pending Prior Authorization through CoverMyMeds and Authorization still Pending.

## 2017-03-03 ENCOUNTER — Telehealth: Payer: Self-pay | Admitting: *Deleted

## 2017-03-03 NOTE — Telephone Encounter (Signed)
[facility] called and stated that they have to speak with Patient's provider regarding Narcotic Refill:  1. Oxycodone over the CDC Dosing and needs a diagnosis other than Chronic Pain and a reason of what is causing the pain.   2. Anything in place to decrease the dosage within the CDC levels.  3. Anything prescribed like Narcan or any other Rx on file to treat possible overdose.   Pharmacist has to speak with Provider.

## 2017-03-06 NOTE — Telephone Encounter (Signed)
[facility], [facility]one was filled 03/06/17. Refill is not showing in PMP Aware database yet. Will update once database shows refill was picked up.

## 2017-03-06 NOTE — Telephone Encounter (Signed)
Called pharmacy and they already refilled medication today. Plan is to decrease both OxyContin and Oxy IR.  On next refill pt will get Oxy IR 15 mg every 6 hours as needed. He just picked up prescription today so next refill will be according to this. Thank you.

## 2017-03-07 NOTE — Telephone Encounter (Signed)
Received fax from [facility] and Oxycodone 30mg  is APPROVED from 03/03/2017-03/03/2018.

## 2017-03-27 ENCOUNTER — Telehealth: Payer: Self-pay | Admitting: *Deleted

## 2017-03-27 NOTE — Telephone Encounter (Signed)
Patient called requesting refill on both his Oxycodone 30mg  and Oxy ER.  Checked NCCSRS Database and Fill Date on Oxycodone 30mg  was 03/06/2017.  I called [facility] and spoke with Tobi Bastosnna to confirm fill date and she stated the Oxycodone 30 mg was filled on 03/06/17 and the OxyER looks like it was also paid for and picked up on 03/06/2017.   I called patient and he stated that he would just call back next week for both and if [facility] could [facility].

## 2017-03-28 NOTE — Telephone Encounter (Signed)
Noted, on next refill he will get  Oxy IR 15 mg every 6 hours as needed and Oxy ER (oxycotin)  10 mg q 12 hours.  Thank you.

## 2017-04-04 MED ORDER — OXYCODONE HCL 15 MG PO TABS: 15.0000 mg | ORAL_TABLET | Freq: Four times a day (QID) | ORAL | 0 refills | Status: DC | PRN

## 2017-04-04 MED ORDER — OXYCODONE HCL ER 10 MG PO T12A: 10.0000 mg | EXTENDED_RELEASE_TABLET | Freq: Two times a day (BID) | ORAL | 0 refills | Status: DC

## 2017-04-04 NOTE — Telephone Encounter (Signed)
Rx sent 

## 2017-04-04 NOTE — Telephone Encounter (Signed)
Patient called yesterday and requested narcotic medications. Informed him that [facility].  [facility] stated to [facility] NCCSRS Database Verified Pharmacy confirmed Corrected Medication changes made in current medication list per Shanda BumpsJessica and Pended to [facility].

## 2017-04-04 NOTE — Addendum Note (Signed)
Addended by: Sharon SellerEUBANKS, Daria Mcmeekin K on: 04/04/2017 09:44 AM   Modules accepted: Orders

## 2017-04-04 NOTE — Addendum Note (Signed)
Addended by: Nelda SevereMAY, Jimmye Wisnieski A on: 04/04/2017 08:29 AM   Modules accepted: Orders

## 2017-04-06 ENCOUNTER — Telehealth: Payer: Self-pay

## 2017-04-06 NOTE — Telephone Encounter (Signed)
[facility].   Dear Prescriber,  We spoke to you patient patient about diabetes care and noticed your patient has not [facility] in the last 180 days.  Your patient would like us to reach out on their behalf to determine if it is appropriate to start a statin therapy. Please send a new prescription for statin therapy if it is appropriate.  Thank you in advance for taking the time to review this information  Sincerely  [facility]   Per Shanda BumpsJessica this will be discussed with patient at next appointment.  I [facility]ed [facility]'s [facility] and made a notation in next visit appointment notes

## 2017-04-19 ENCOUNTER — Ambulatory Visit: Payer: BLUE CROSS/BLUE SHIELD | Admitting: Nurse Practitioner

## 2017-04-24 ENCOUNTER — Encounter: Payer: Self-pay | Admitting: Nurse Practitioner

## 2017-04-24 ENCOUNTER — Ambulatory Visit: Payer: BLUE CROSS/BLUE SHIELD | Admitting: Nurse Practitioner

## 2017-04-24 VITALS — BP 124/86 | HR 106 | Temp 98.2°F | Resp 10 | Ht 75.0 in | Wt 267.0 lb

## 2017-04-24 DIAGNOSIS — M542 Cervicalgia: Secondary | ICD-10-CM | POA: Diagnosis not present

## 2017-04-24 DIAGNOSIS — E782 Mixed hyperlipidemia: Secondary | ICD-10-CM

## 2017-04-24 DIAGNOSIS — R7989 Other specified abnormal findings of blood chemistry: Secondary | ICD-10-CM

## 2017-04-24 DIAGNOSIS — M5137 Other intervertebral disc degeneration, lumbosacral region: Secondary | ICD-10-CM | POA: Diagnosis not present

## 2017-04-24 DIAGNOSIS — R000 Tachycardia, unspecified: Secondary | ICD-10-CM

## 2017-04-24 DIAGNOSIS — G8929 Other chronic pain: Secondary | ICD-10-CM | POA: Diagnosis not present

## 2017-04-24 DIAGNOSIS — E1165 Type 2 diabetes mellitus with hyperglycemia: Secondary | ICD-10-CM

## 2017-04-24 DIAGNOSIS — E1140 Type 2 diabetes mellitus with diabetic neuropathy, unspecified: Secondary | ICD-10-CM | POA: Diagnosis not present

## 2017-04-24 DIAGNOSIS — S43421D Sprain of right rotator cuff capsule, subsequent encounter: Secondary | ICD-10-CM

## 2017-04-24 DIAGNOSIS — IMO0002 Reserved for concepts with insufficient information to code with codable children: Secondary | ICD-10-CM

## 2017-04-24 DIAGNOSIS — M5441 Lumbago with sciatica, right side: Secondary | ICD-10-CM | POA: Diagnosis not present

## 2017-04-24 MED ORDER — TESTOSTERONE 30 MG/ACT TD SOLN: TRANSDERMAL | 0 refills | Status: DC

## 2017-04-24 NOTE — Patient Instructions (Addendum)
Need to follow up with ophthalmologist for diabetic eye exam  Make appt for fasting blood work next week  Xray of lumbar and cervical spine ordered- to be done at [facility].   If you feel like you need orthopedic referral due to uncontrol in symptoms with medication reduction we will refer you to ortho or neurosurgery for further evaluation and treatment

## 2017-04-24 NOTE — Progress Notes (Signed)
Careteam: Patient Care Team: Sharon Seller, NP as PCP - General (Geriatric Medicine) Sheran Luz, MD as Consulting Physician (Physical Medicine and Rehabilitation) Pati Gallo, MD as Consulting Physician (Sports Medicine)  Advanced Directive information    No Known Allergies  Chief Complaint  Patient presents with  . 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   . Medication Management    Discuss need for statin per pharmacy recomendation   . Medication Refill    Refill Testosterone   . Health Maintenance    DM Foot exam and MALB, patient will set up eye exam      HPI: Patient is a 47 y.o. male seen in the office today for routine follow up. Pt with hx of vit d def, palpitations, GERD, diabetes type 2, hyperlipidemia, anxiety/depression, arthritis.  He is on chronic pain medication due to chronic pain of the lumbar spine. In 2003 MR lumbar and thoracic spine done which revealed  IMPRESSION: 1.  SINGLE LEVEL PATHOLOGY AT L5-S1 WITH A MODERATE SIZED BROAD BASED DISC HERNIATION WHICH INDENTS THE VENTRAL ASPECT OF THE THECAL SAC AND COULD AFFECT EITHER S1 NERVE ROOT. MRI THORACIC SPINE: MULTIPLANAR T1 AND T2 WEIGHTED IMAGING WAS PERFORMED. ALIGNMENT OF THE SPINE APPEARS NORMAL.  THERE IS NO ABNORMALITY AT T1-2 OR T2-3. AT T3-4, THERE IS MINIMAL DEGENERATIVE DISC DISEASE WITH SMALL CENTRAL TO RIGHT SIDED OSTEOPHYTES BUT NO SIGN OF SOFT DISC HERNIATION.  THE CANAL APPEARS WIDELY PATENT. T4-5, T5-6 AND T6-7 LEVELS ARE NORMAL. AT T7-8, THE DISC SHOWS DEGENERATION AND THERE IS A SMALL CENTRAL TO RIGHT SIDED DISC HERNIATION. THIS ENCROACHES SLIGHTLY UPON THE SPINAL CANAL BUT DOES NOT APPEAR TO COMPRESS OR DEFORM THE CORD. NO FORAMINAL EXTENSION IS SEEN. AT T8-9, T9-10, T10-11, T11-12 AND T12-L1 DISCS ALL APPEAR NORMAL. IMPRESSION 1.  SMALL CENTRAL TO RIGHT SIDED DISC HERNIATION AT T7-8 OF  QUESTIONABLE SIGNIFICANCE. 2.  MINIMAL RIGHT SIDED SPONDYLOSIS AT T3-4 OF QUESTIONABLE SIGNIFICANCE. He was sent to ortho at the time and they offered PT which was not effective he was placed on pain medication that was increased however at this time de-escalation of pain medication is needed and he reports he has been having increased pain to his spine since the medication has been reduced. Reports the pain comes and goes and is not constant. Pain in the lower back shooting pain mid back that goes into legs, pins and needle. Worse when he turns. Reports symptoms in the last few months have gotten worse. Only has pain some of the time. Also reports pain in cervical spine, when he moves his head it will shot into his temples. Pain scale of 5/10. Reports more of "Headache" when it happens.   Also noted that since his injury to his rotator cuff he also has been having back pain of mid-back but it has not been addressed by workers comp but plans on addressing this with them so he has deferred this today during this OV. [place] shoulder is doing much better. ROM has improved, pain is minimal compared to a few months ago. Working with [facility] for work conditioning/ PT for his shoulder injury.  Hyperlipidemia- wanted to do diet modifications before placed on medication. States he has not been able to make many dietary changes but would like to follow up lab work before he starts medication   Does not wish to have flu vaccine today  Only using testosterone a few times a week. Feeling more sluggish  Review of Systems:  Review of Systems  Constitutional: Negative for chills, fever and weight loss.       Decrease energy   HENT: Negative for congestion and hearing loss.   Respiratory: Negative for cough and shortness of breath.   Cardiovascular: Negative for chest pain, palpitations and leg swelling.  Gastrointestinal: Positive for constipation (controlled with colace). Negative for abdominal  pain, diarrhea and heartburn.  Musculoskeletal: Positive for back pain, joint pain (shoulder pain), myalgias and neck pain. Negative for falls.  Skin: Negative for itching and rash.  Neurological: Positive for tingling, sensory change and headaches. Negative for dizziness, tremors, loss of consciousness and weakness.  Psychiatric/Behavioral: Negative for depression and memory loss. The patient is not nervous/anxious and does not have insomnia.     Past Medical History:  Diagnosis Date  . Anxiety   . Arthritis   . Depression   . Diabetes mellitus without complication (HCC)   . GERD (gastroesophageal reflux disease)   . Heartburn   . Hematuria, unspecified   . Lumbago   . Other abnormal blood chemistry   . Other malaise and fatigue   . Palpitations   . Unspecified vitamin D deficiency    Past Surgical History:  Procedure Laterality Date  . SHOULDER ARTHROSCOPY WITH ROTATOR CUFF REPAIR AND SUBACROMIAL DECOMPRESSION Right 12/22/2016   Procedure: Right shoulder arthroscopy, subacromial decompression, mini open rotator cuff repair;  Surgeon: Jene EveryBeane, Jeffrey, MD;  Location: [facility];  Service: Orthopedics;  Laterality: Right;  120 mins   Social History:   reports that he has quit smoking. His smoking use included cigarettes. He started smoking about 4 weeks ago. He has a 15.00 pack-year smoking history. he has never used smokeless tobacco. He reports that he drinks alcohol. He reports that he does not use drugs.  No family history on file.  Medications: Patient's Medications  New Prescriptions   No medications on file  Previous Medications   CHOLECALCIFEROL (VITAMIN D3) 5000 UNITS CAPS    Take one tablet by mouth Monday, Wednesday and Friday for Vitamin D Supplement.   DOCUSATE SODIUM (COLACE) 100 MG CAPSULE    Take 1 capsule (100 mg total) by mouth 2 (two) times daily as needed for mild constipation.   METFORMIN (GLUCOPHAGE) 500 MG TABLET    Take one with breakfast each morning to help  control blood sugar   METHOCARBAMOL (ROBAXIN) 500 MG TABLET    Take 1 tablet (500 mg total) by mouth every 6 (six) hours as needed for muscle spasms.   MULTIPLE VITAMINS-MINERALS (MULTIVITAMIN PO)    Take 1 tablet by mouth daily.   NAPROXEN (NAPROSYN) 250 MG TABLET    Daily as needed for back pain   OXYCODONE (OXYCONTIN) 10 MG 12 HR TABLET    Take 1 tablet (10 mg total) by mouth every 12 (twelve) hours.   OXYCODONE (ROXICODONE) 15 MG IMMEDIATE RELEASE TABLET    Take 1 tablet (15 mg total) by mouth every 6 (six) hours as needed for pain.   TESTOSTERONE 30 MG/ACT SOLN    Apply one pump under the arm daily  Modified Medications   No medications on file  Discontinued Medications   POLYETHYLENE GLYCOL (MIRALAX / GLYCOLAX) PACKET    Take 17 g by mouth daily.     Physical Exam:  Vitals:   04/24/17 1301  BP: 124/86  Pulse: (!) 106  Resp: 10  Temp: 98.2 F (36.8 C)  TempSrc: Oral  SpO2: 97%  Weight: 267 lb (121.1 kg)  Height: 6\' 3"  (1.905 m)   Body mass index is 33.37 kg/m.  Physical Exam  Constitutional: He is oriented to person, place, and time. He appears well-developed and well-nourished. No distress.  HENT:  Right Ear: External ear normal.  Nose: Nose normal.  Mouth/Throat: No oropharyngeal exudate.  Eyes: Conjunctivae and EOM are normal. Pupils are equal, round, and reactive to light.  Cardiovascular: Normal rate, regular rhythm and normal heart sounds. Exam reveals no gallop.  Pulmonary/Chest: Effort normal and breath sounds normal.  Abdominal: Soft. Bowel sounds are normal.  Musculoskeletal: Normal range of motion. He exhibits no edema.       Cervical back: He exhibits tenderness. He exhibits normal range of motion, no swelling, no edema, no deformity and no pain.       Lumbar back: He exhibits tenderness. He exhibits normal range of motion, no swelling and no edema.  Negative straight leg raises bilaterally No tenderness to paraspinal muscles to lumbar or cervical spine    Neurological: He is alert and oriented to person, place, and time. He has normal reflexes. No cranial nerve deficit. Coordination normal.  Skin: Skin is warm and dry.  Psychiatric: He has a normal mood and affect. His behavior is normal.    Labs reviewed: Basic Metabolic Panel: Recent Labs    09/23/16 0929 12/20/16 1329  NA 137 139  K 4.0 4.2  CL 100 103  CO2 24 27  GLUCOSE 102* 93  BUN 13 16  CREATININE 0.86 0.83  CALCIUM 9.3 9.6   Liver Function Tests: Recent Labs    09/23/16 0929  AST 26  ALT 24  ALKPHOS 75  BILITOT 0.4  PROT 6.5  ALBUMIN 3.9   No results for input(s): LIPASE, AMYLASE in the last 8760 hours. No results for input(s): AMMONIA in the last 8760 hours. CBC: Recent Labs    11/14/16 1545 12/20/16 1329  WBC 11.9* 10.0  NEUTROABS 8,830*  --   HGB 16.5 15.2  HCT 48.8 44.2  MCV 79.6* 79.6  PLT 272 248   Lipid Panel: Recent Labs    01/23/17 0921  CHOL 210*  HDL 50  TRIG 181*  CHOLHDL 4.2   TSH: No results for input(s): TSH in the last 8760 hours. A1C: Lab Results  Component Value Date   HGBA1C 5.7 (H) 12/20/2016     Assessment/Plan 1. DM type 2, uncontrolled, with neuropathy (HCC) -continue on metformin with dietary modifications  - Hemoglobin A1c; Future - Microalbumin, urine  2. DISC DISEASE, LUMBAR -noted on prior study, will follow up lumbar spine xray  3. Sprain of right rotator cuff capsule, subsequent encounter S/p repair follow up with workers Medical illustrator and PT, doing much better post op.   4. Low testosterone - Testosterone 30 MG/ACT SOLN; Apply one pump under the arm daily  Dispense: 90 mL; Refill: 0 - Testosterone; Future  5. Neck pain -has not had imaging in several years, reporting increase pain with titration of medication, not agreeable to orthopedic or PT at this time - DG Cervical Spine Complete; Future  6. Mixed hyperlipidemia -wanted to make dietary modifications before medications, will follow  up lab at this time.  - Lipid Panel; Future - COMPLETE METABOLIC PANEL WITH GFR; Future  7. Chronic right-sided low back pain with right-sided sciatica -reports worsening pain since reduction of medications, not willing to see specialist or go to PT at this time for back pain  at this time since he is seeing them for his right shoulder.   Educated to him that we would continue to titrate pain medication and he was agreeable. Discussed that further PT in the furture may be beneficial.  - DG Lumbar Spine Complete; Future - Drug Tox Monitor 1 w/Conf, Oral Fld  8. Tachycardia - EKG 12-Lead- SR rate 100 on EKG -will get lab work as well  - TSH; Future -- Drug Tox Monitor 1 w/Conf, Oral Fld  Next appt: to follow up for fasting labs and 3 month follow up  Tiffane Sheldon K. Biagio Borg  [facility] [phone] 8 am - 5 pm) [phone] (after hours)

## 2017-04-25 ENCOUNTER — Other Ambulatory Visit: Payer: BLUE CROSS/BLUE SHIELD

## 2017-04-26 ENCOUNTER — Encounter: Payer: Self-pay | Admitting: Nurse Practitioner

## 2017-04-27 ENCOUNTER — Telehealth: Payer: Self-pay

## 2017-04-27 ENCOUNTER — Ambulatory Visit: Payer: Self-pay | Admitting: Nurse Practitioner

## 2017-04-27 LAB — DRUG TOX MONITOR 1 W/CONF, ORAL FLD
Amphetamines: NEGATIVE ng/mL (ref <10)
BUPRENORPHINE: NEGATIVE ng/mL (ref <0.10)
Barbiturates: NEGATIVE ng/mL (ref <10)
Benzodiazepines: NEGATIVE ng/mL (ref <0.50)
COCAINE: NEGATIVE ng/mL (ref <5.0)
COTININE: 8.2 ng/mL — AB (ref <5.0)
Codeine: NEGATIVE ng/mL (ref <2.5)
Dihydrocodeine: NEGATIVE ng/mL (ref <2.5)
Fentanyl: NEGATIVE ng/mL (ref <0.10)
HEROIN METABOLITE: NEGATIVE ng/mL (ref <1.0)
Hydrocodone: NEGATIVE ng/mL (ref <2.5)
Hydromorphone: NEGATIVE ng/mL (ref <2.5)
MARIJUANA: NEGATIVE ng/mL (ref <2.5)
MDMA: NEGATIVE ng/mL (ref <10)
METHADONE: NEGATIVE ng/mL (ref <5.0)
MORPHINE: NEGATIVE ng/mL (ref <2.5)
Meprobamate: NEGATIVE ng/mL (ref <2.5)
NICOTINE METABOLITE: POSITIVE ng/mL — AB (ref <5.0)
NOROXYCODONE: 16.2 ng/mL — AB (ref <2.5)
Norhydrocodone: NEGATIVE ng/mL (ref <2.5)
OXYCODONE: 97.6 ng/mL — AB (ref <2.5)
OXYMORPHONE: NEGATIVE ng/mL (ref <2.5)
Opiates: POSITIVE ng/mL — AB (ref <2.5)
Phencyclidine: NEGATIVE ng/mL (ref <10)
TAPENTADOL: NEGATIVE ng/mL (ref <5.0)
Tramadol: NEGATIVE ng/mL (ref <5.0)
Zolpidem: NEGATIVE ng/mL (ref <5.0)

## 2017-04-27 LAB — MICROALBUMIN, URINE: MICROALB UR: 1.4 mg/dL

## 2017-04-27 NOTE — Telephone Encounter (Signed)
Patient called to ask for medication refills for Monday 05/01/17. After looking at previous refill and [place] Database, patient is not due for refill until 05/02/17. I called patient to let him know that medications would be refilled on 05/02/17 and patient stated that he would be out of medication before then due to recent decrease in med quantity. I explained that enough medication was provided based on new dosing directions and he stated that it was hard to just cut back with no warning. Patient was informed that decrease instruction were discussed with him at his appointment and he stated that he was not told that it would be decreased again with the last refill.   Patient was informed that a message would be sent to provider to see about a 1 day early refill but based on new guidelines, early refills were not normally allowed.   Please advise if it is ok to refill 1 day early?

## 2017-04-28 NOTE — Telephone Encounter (Signed)
Refill will be provided when due on 05/02/17. Thank you.

## 2017-04-28 NOTE — Telephone Encounter (Signed)
Noted. Pt's name will be added to refill list for 05/02/17.

## 2017-05-02 ENCOUNTER — Other Ambulatory Visit: Payer: Self-pay | Admitting: *Deleted

## 2017-05-02 MED ORDER — OXYCODONE HCL ER 10 MG PO T12A: 10.0000 mg | EXTENDED_RELEASE_TABLET | Freq: Every day | ORAL | 0 refills | Status: DC

## 2017-05-02 MED ORDER — OXYCODONE HCL 10 MG PO TABS: 10.0000 mg | ORAL_TABLET | Freq: Four times a day (QID) | ORAL | 0 refills | Status: DC | PRN

## 2017-05-02 NOTE — Addendum Note (Signed)
Addended by: Chriss DriverLANE, TERRY L on: 05/02/2017 04:07 PM   Modules accepted: Orders

## 2017-05-02 NOTE — Telephone Encounter (Signed)
Ongoing dose reduction as discussed during OV

## 2017-05-02 NOTE — Addendum Note (Signed)
Addended by: Nelda SevereMAY, ANITA A on: 05/02/2017 02:41 PM   Modules accepted: Orders

## 2017-05-02 NOTE — Telephone Encounter (Signed)
[facility] called and left message on Clinical Intake stating that they need a diagnosis for pain medication usage other than Chronic pain, stated they need the condition causing the pain. Also stated that the Supervising Physician over [facility] has to have her information on the Rxs. I spoke with Chrae and we pulled up our Rx and it shows the Supervising Physician.   I called and spoke with the pharmacist and she stated that she is looking at the Rx and there is no Supervising Physician listed nor her DEA. Thinking it Loudon Krakow have been a glitch and wants it resent. Needs the provider and Dx listed on the Rx.   Pended Rx for Shanda BumpsJessica to add Dx.

## 2017-05-02 NOTE — Telephone Encounter (Signed)
A call was received from [facility] stating that the diagnosis codes and supervising provider's name and DEA number. Necessary information was added to Rx and resent.

## 2017-05-02 NOTE — Addendum Note (Signed)
Addended by: Chriss DriverLANE, TERRY L on: 05/02/2017 03:53 PM   Modules accepted: Orders

## 2017-05-02 NOTE — Telephone Encounter (Signed)
Patient requested refill [place] Verified LR 04/04/17 Pharmacy Conf[facility]

## 2017-05-31 ENCOUNTER — Other Ambulatory Visit: Payer: Self-pay

## 2017-05-31 ENCOUNTER — Other Ambulatory Visit: Payer: Self-pay | Admitting: Internal Medicine

## 2017-05-31 ENCOUNTER — Other Ambulatory Visit: Payer: Self-pay | Admitting: *Deleted

## 2017-05-31 DIAGNOSIS — E1140 Type 2 diabetes mellitus with diabetic neuropathy, unspecified: Secondary | ICD-10-CM

## 2017-05-31 DIAGNOSIS — IMO0002 Reserved for concepts with insufficient information to code with codable children: Secondary | ICD-10-CM

## 2017-05-31 DIAGNOSIS — E1165 Type 2 diabetes mellitus with hyperglycemia: Principal | ICD-10-CM

## 2017-05-31 MED ORDER — OXYCODONE HCL ER 10 MG PO T12A: 10.0000 mg | EXTENDED_RELEASE_TABLET | ORAL | 0 refills | Status: DC

## 2017-05-31 MED ORDER — OXYCODONE HCL 10 MG PO TABS: 10.0000 mg | ORAL_TABLET | Freq: Four times a day (QID) | ORAL | 0 refills | Status: DC | PRN

## 2017-05-31 NOTE — Telephone Encounter (Signed)
Re-sent to provider for refill.

## 2017-05-31 NOTE — Telephone Encounter (Signed)
Rx was re-sent to provider due to printing error with previous refill attempt.

## 2017-05-31 NOTE — Telephone Encounter (Signed)
Patient requested [place] Verified LR: 05/02/17 (from the 12th to today Rx due) Pharmacy Confirmed [facility]ended Rx's [facility]

## 2017-05-31 NOTE — Telephone Encounter (Signed)
Patient notified and agreed.  

## 2017-05-31 NOTE — Telephone Encounter (Signed)
This will be the last refill on the OxyContin 10 mg 12 hour tablet. To take 1 tablet every other day until complete. Will continue to oxycodone 10 mg by mouth every 6 hours this month, next month he will be reduced to oxycodone 5 mg by mouth every 6 hours as needed for pain. To continue Physical therapy and following with orthopedics

## 2017-06-01 ENCOUNTER — Telehealth: Payer: Self-pay

## 2017-06-01 NOTE — Telephone Encounter (Signed)
I called patient to ask if he has requested following medications:   lidocaine 7%/ Tetracaine 7% cream  Doxepin HCI cream 5%  Chlorzoxazone tablets 250 mg  Naproxen oral suspension 125 mg/ 5 ml  Calcipotriene cream 0.005 %  kamdoy Rx skin emulsion  flucoinonide cream USP 0.1 %  There is no pharmacy name on the form but there is a fax number [phone]  I left a message asking patient to call the office.

## 2017-06-02 NOTE — Telephone Encounter (Signed)
Name of pharmacy is [facility]  Phone: [phone]  Fax: [phone]  Medications are being requested for   Pain location: Right shoulder Pain location: lower back Skin conditions location: right arm Dry itchy skin

## 2017-06-06 NOTE — Telephone Encounter (Signed)
Forms were faxed back to [facility] at [phone] with the following message:  Denied: Pt must contact the office.   Forms were sent to scanning

## 2017-06-20 ENCOUNTER — Other Ambulatory Visit: Payer: Self-pay | Admitting: Nurse Practitioner

## 2017-06-20 DIAGNOSIS — R7989 Other specified abnormal findings of blood chemistry: Secondary | ICD-10-CM

## 2017-06-20 NOTE — Telephone Encounter (Signed)
A medication refill was received from pharmacy for testosterone 30 mg/act. Rx was pended to provider for approval.

## 2017-07-10 ENCOUNTER — Other Ambulatory Visit: Payer: Self-pay | Admitting: Nurse Practitioner

## 2017-07-22 ENCOUNTER — Other Ambulatory Visit: Payer: Self-pay | Admitting: Nurse Practitioner

## 2017-07-22 DIAGNOSIS — R7989 Other specified abnormal findings of blood chemistry: Secondary | ICD-10-CM

## 2017-07-25 ENCOUNTER — Other Ambulatory Visit: Payer: Self-pay | Admitting: Nurse Practitioner

## 2017-07-25 ENCOUNTER — Telehealth: Payer: Self-pay

## 2017-07-25 DIAGNOSIS — R739 Hyperglycemia, unspecified: Secondary | ICD-10-CM

## 2017-07-25 DIAGNOSIS — R7989 Other specified abnormal findings of blood chemistry: Secondary | ICD-10-CM

## 2017-07-25 DIAGNOSIS — E782 Mixed hyperlipidemia: Secondary | ICD-10-CM

## 2017-07-25 NOTE — Telephone Encounter (Signed)
Will approve Testerone but he needs follow up labs, need to come in prior to next appt for fasting lab work

## 2017-07-25 NOTE — Telephone Encounter (Signed)
I left a message for patient to call the office to schedule a fasting lab appointment the last week of June prior to his July 1st office visit.

## 2017-07-25 NOTE — Telephone Encounter (Signed)
A medication refill was received from pharmacy for testosterone 30 mg/act. Rx was pended to provider for approval  after verifying last fill date, provider, and quantity on [place] database

## 2017-08-08 NOTE — Telephone Encounter (Signed)
I left a message for patient to call the office 

## 2017-08-21 ENCOUNTER — Ambulatory Visit: Payer: BLUE CROSS/BLUE SHIELD | Admitting: Nurse Practitioner

## 2017-08-28 ENCOUNTER — Ambulatory Visit: Payer: BLUE CROSS/BLUE SHIELD | Admitting: Nurse Practitioner

## 2017-08-29 ENCOUNTER — Encounter: Payer: Self-pay | Admitting: Internal Medicine

## 2017-08-29 ENCOUNTER — Ambulatory Visit: Payer: BLUE CROSS/BLUE SHIELD | Admitting: Internal Medicine

## 2017-08-29 VITALS — BP 130/80 | HR 102 | Temp 98.2°F | Resp 20 | Ht 75.0 in | Wt 273.0 lb

## 2017-08-29 DIAGNOSIS — M5137 Other intervertebral disc degeneration, lumbosacral region: Secondary | ICD-10-CM

## 2017-08-29 DIAGNOSIS — R739 Hyperglycemia, unspecified: Secondary | ICD-10-CM | POA: Diagnosis not present

## 2017-08-29 DIAGNOSIS — G894 Chronic pain syndrome: Secondary | ICD-10-CM | POA: Diagnosis not present

## 2017-08-29 DIAGNOSIS — R000 Tachycardia, unspecified: Secondary | ICD-10-CM | POA: Diagnosis not present

## 2017-08-29 DIAGNOSIS — R7989 Other specified abnormal findings of blood chemistry: Secondary | ICD-10-CM | POA: Diagnosis not present

## 2017-08-29 DIAGNOSIS — E782 Mixed hyperlipidemia: Secondary | ICD-10-CM

## 2017-08-29 DIAGNOSIS — E669 Obesity, unspecified: Secondary | ICD-10-CM

## 2017-08-29 DIAGNOSIS — G8929 Other chronic pain: Secondary | ICD-10-CM | POA: Diagnosis not present

## 2017-08-29 DIAGNOSIS — M546 Pain in thoracic spine: Secondary | ICD-10-CM | POA: Diagnosis not present

## 2017-08-29 MED ORDER — OXYCODONE HCL 5 MG PO TABA: 5.0000 mg | ORAL_TABLET | Freq: Four times a day (QID) | ORAL | 0 refills | Status: DC | PRN

## 2017-08-29 NOTE — Progress Notes (Signed)
Patient ID: Taylor Kidd, male   DOB: 08-10-1970, 47 y.o.   MRN: [id number]   Location:  [facility] OFFICE  Provider: DR Elmon Kirschner  Code Status:  Goals of Care:  Advanced Directives 01/17/2017  Does Patient Have a Medical Advance Directive? No  Would patient like information on creating a medical advance directive? -     Chief Complaint  Patient presents with  . Medical Management of Chronic Issues    4 mo    HPI: Patient is a 47 y.o. male seen today for medical management of chronic diseases.  He reports severe pain in right thoracic region that impacts ability to work. Pain awakens from sleep. He had an imaging study recently of back but unsure which part. He is followed by Dr [name]. He stopped taking Oxycodone on his own, thinking he did not believe it was helping but soon discovered that it was helping a little. He has paresthesias. No loss of bowel/bladder control.   DM - he was dx several yrs ago and takes metformin. He admitted that he does not take med every day as ordered. No low BS reactions. No diarrhea. Last A1c <6%  Low T - stable on testosterone.  Obesity - wt up 6 lbs. BMI 34.12  Past Medical History:  Diagnosis Date  . Anxiety   . Arthritis   . Depression   . Diabetes mellitus without complication (HCC)   . GERD (gastroesophageal reflux disease)   . Heartburn   . Hematuria, unspecified   . Lumbago   . Other abnormal blood chemistry   . Other malaise and fatigue   . Palpitations   . Unspecified vitamin D deficiency     Past Surgical History:  Procedure Laterality Date  . SHOULDER ARTHROSCOPY WITH ROTATOR CUFF REPAIR AND SUBACROMIAL DECOMPRESSION Right 12/22/2016   Procedure: Right shoulder arthroscopy, subacromial decompression, mini open rotator cuff repair;  Surgeon: Jene Every, MD;  Location: [facility]S;  Service: Orthopedics;  Laterality: Right;  120 mins     reports that he has quit smoking. His smoking use included cigarettes. He started smoking  about 5 months ago. He has a 15.00 pack-year smoking history. He has never used smokeless tobacco. He reports that he drinks alcohol. He reports that he does not use drugs. Social History   Socioeconomic History  . Marital status: Married    Spouse name: Not on file  . Number of children: Not on file  . Years of education: Not on file  . Highest education level: Not on file  Occupational History  . Not on file  Social Needs  . Financial resource strain: Not on file  . Food insecurity:    Worry: Not on file    Inability: Not on file  . Transportation needs:    Medical: Not on file    Non-medical: Not on file  Tobacco Use  . Smoking status: Former Smoker    Packs/day: 0.50    Years: 30.00    Pack years: 15.00    Types: Cigarettes    Start date: 03/23/2017  . Smokeless tobacco: Never Used  Substance and Sexual Activity  . Alcohol use: Yes    Alcohol/week: 0.0 oz    Comment: socially/moderation  . Drug use: No  . Sexual activity: Yes  Lifestyle  . Physical activity:    Days per week: Not on file    Minutes per session: Not on file  . Stress: Not on file  Relationships  . Social connections:  Talks on phone: Not on file    Gets together: Not on file    Attends religious service: Not on file    Active member of club or organization: Not on file    Attends meetings of clubs or organizations: Not on file    Relationship status: Not on file  . Intimate partner violence:    Fear of current or ex partner: Not on file    Emotionally abused: Not on file    Physically abused: Not on file    Forced sexual activity: Not on file  Other Topics Concern  . Not on file  Social History Narrative  . Not on file    No family history on file.  No Known Allergies  Outpatient Encounter Medications as of 08/29/2017  Medication Sig  . docusate sodium (COLACE) 100 MG capsule Take 1 capsule (100 mg total) by mouth 2 (two) times daily as needed for mild constipation.  . metFORMIN  (GLUCOPHAGE) 500 MG tablet TAKE 1 TABLET BY MOUTH WITH BREAKFAST EACH MORNING TO HELP CONTROL BLOOD SUGAR  . methocarbamol (ROBAXIN) 500 MG tablet Take 1 tablet (500 mg total) by mouth every 6 (six) hours as needed for muscle spasms.  . Multiple Vitamins-Minerals (MULTIVITAMIN PO) Take 1 tablet by mouth daily.  . Testosterone 30 MG/ACT SOLN APPLY ONE PUMP UNDER THE ARM DAILY  . Oxycodone HCl 10 MG TABS Take 1 tablet (10 mg total) by mouth every 6 (six) hours as needed. Dx:M51.37, G89.4, M54.5 Supervising Provider: Bufford Spikes, DO [phone] (Patient not taking: Reported on 08/29/2017)  . [DISCONTINUED] Cholecalciferol (VITAMIN D3) 5000 units CAPS Take one tablet by mouth Monday, Wednesday and Friday for Vitamin D Supplement. (Patient not taking: Reported on 04/24/2017)  . [DISCONTINUED] naproxen (NAPROSYN) 250 MG tablet Daily as needed for back pain   No facility-administered encounter medications on file as of 08/29/2017.     Review of Systems:  Review of Systems  Musculoskeletal: Positive for arthralgias, back pain, joint swelling and neck pain.  All other systems reviewed and are negative.   Health Maintenance  Topic Date Due  . PNEUMOCOCCAL POLYSACCHARIDE VACCINE (1) 08/02/1972  . HIV Screening  08/02/1985  . OPHTHALMOLOGY EXAM  09/21/2016  . HEMOGLOBIN A1C  06/20/2017  . INFLUENZA VACCINE  11/21/2017 (Originally 09/21/2017)  . FOOT EXAM  04/25/2018  . URINE MICROALBUMIN  04/25/2018  . TETANUS/TDAP  12/07/2025    Physical Exam: Vitals:   08/29/17 1118  BP: 130/80  Pulse: (!) 102  Resp: 20  Temp: 98.2 F (36.8 C)  TempSrc: Oral  SpO2: 97%  Weight: 273 lb (123.8 kg)  Height: 6\' 3"  (1.905 m)   Body mass index is 34.12 kg/m. Physical Exam  Constitutional: He is oriented to person, place, and time. He appears well-developed and well-nourished.  HENT:  Mouth/Throat: Oropharynx is clear and moist.  MMM; no oral thrush  Eyes: Pupils are equal, round, and reactive to light. No  scleral icterus.  Neck: Neck supple. Carotid bruit is not present. No thyromegaly present.  Cardiovascular: Normal rate, regular rhythm, normal heart sounds and intact distal pulses. Exam reveals no gallop and no friction rub.  No murmur heard. no distal LE swelling. No calf TTP  Pulmonary/Chest: Effort normal and breath sounds normal. He has no wheezes. He has no rales. He exhibits no tenderness.  Abdominal: Soft. Bowel sounds are normal. He exhibits no distension, no abdominal bruit, no pulsatile midline mass and no mass. There is no hepatomegaly. There is no  tenderness. There is no rebound and no guarding.  Musculoskeletal: He exhibits edema (small and large joints) and tenderness.       Lumbar back: He exhibits decreased range of motion, tenderness and spasm.       Back:  Ropy tissue texture changes along thoracic paravertebral spine  Lymphadenopathy:    He has no cervical adenopathy.  Neurological: He is alert and oriented to person, place, and time. He has normal reflexes.  Neg SLR  Skin: Skin is warm and dry. No rash noted.  Psychiatric: He has a normal mood and affect. His behavior is normal. Judgment and thought content normal.    Labs reviewed: Basic Metabolic Panel: Recent Labs    09/23/16 0929 12/20/16 1329  NA 137 139  K 4.0 4.2  CL 100 103  CO2 24 27  GLUCOSE 102* 93  BUN 13 16  CREATININE 0.86 0.83  CALCIUM 9.3 9.6   Liver Function Tests: Recent Labs    09/23/16 0929  AST 26  ALT 24  ALKPHOS 75  BILITOT 0.4  PROT 6.5  ALBUMIN 3.9   No results for input(s): LIPASE, AMYLASE in the last 8760 hours. No results for input(s): AMMONIA in the last 8760 hours. CBC: Recent Labs    11/14/16 1545 12/20/16 1329  WBC 11.9* 10.0  NEUTROABS 8,830*  --   HGB 16.5 15.2  HCT 48.8 44.2  MCV 79.6* 79.6  PLT 272 248   Lipid Panel: Recent Labs    01/23/17 0921  CHOL 210*  HDL 50  LDLCALC 128*  TRIG 181*  CHOLHDL 4.2   Lab Results  Component Value Date    HGBA1C 5.7 (H) 12/20/2016    Procedures since last visit: No results found.  Assessment/Plan   ICD-10-CM   1. Chronic right-sided thoracic back pain M54.6 Urine Drug Screen w/Alc, no confirm([facility])   G89.29 OxyCODONE HCl, Abuse Deter, (OXAYDO) 5 MG TABA  2. 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 Chronic pain syndrome G89.4 OxyCODONE HCl, Abuse Deter, (OXAYDO) 5 MG TABA  4. Mixed hyperlipidemia E78.2 COMPLETE METABOLIC PANEL WITH GFR    Lipid Panel  5. Hyperglycemia R73.9 Hemoglobin A1c  6. Obesity (BMI 30-39.9) E66.9   7. Low testosterone R79.89 Testosterone  8. Tachycardia R00.0 TSH   Need Orthopedic notes/imaging results from Dr Shelle IronBeane  RECOMMEND PT TO THORACIC SPINE DUE TO PAIN  RESUME OXYCODONE 5 MG 4 TIMES DAILY AS NEEDED  MAY TAKE WITH ES TYLENOL (max 1000mg  at a time) UP TO 3 TIMES DAILY  Cont diet and exercise program  Follow up in 3 mos with Shanda BumpsJessica for chronic back, hyperglycemia, hyperlipidemia  Adeyemi Hamad S. Ancil Linseyarter, D. O., F. A. C. O. I.  [facility] [address] [place], [place] [address] [phone] Cell (Monday-Friday 8 AM - 5 PM) [phone] After 5 PM and follow prompts

## 2017-08-29 NOTE — Patient Instructions (Addendum)
Need Orthopedic notes/imaging results from Dr Shelle IronBeane  RECOMMEND PT TO THORACIC SPINE DUE TO PAIN  RESUME OXYCODONE 5 MG 4 TIMES DAILY AS NEEDED  MAY TAKE WITH ES TYLENOL (max 1000mg  at a time) UP TO 3 TIMES DAILY  Follow up in 3 mos with Shanda BumpsJessica for chronic back, hyperglycemia, hyperlipidemia

## 2017-08-30 LAB — COMPLETE METABOLIC PANEL WITH GFR
AG Ratio: 1.5 (calc) (ref 1.0–2.5)
ALBUMIN MSPROF: 4.5 g/dL (ref 3.6–5.1)
ALKALINE PHOSPHATASE (APISO): 109 U/L (ref 40–115)
ALT: 19 U/L (ref 9–46)
AST: 18 U/L (ref 10–40)
BUN: 17 mg/dL (ref 7–25)
CO2: 29 mmol/L (ref 20–32)
CREATININE: 0.88 mg/dL (ref 0.60–1.35)
Calcium: 9.8 mg/dL (ref 8.6–10.3)
Chloride: 101 mmol/L (ref 98–110)
GFR, Est African American: 119 mL/min/{1.73_m2} (ref >=60)
GFR, Est Non African American: 102 mL/min/{1.73_m2} (ref >=60)
GLUCOSE: 86 mg/dL (ref 65–99)
Globulin: 3 g/dL (calc) (ref 1.9–3.7)
Potassium: 4.2 mmol/L (ref 3.5–5.3)
Sodium: 138 mmol/L (ref 135–146)
TOTAL PROTEIN: 7.5 g/dL (ref 6.1–8.1)
Total Bilirubin: 0.6 mg/dL (ref 0.2–1.2)

## 2017-08-30 LAB — LIPID PANEL
CHOL/HDL RATIO: 6.2 (calc) — AB (ref <5.0)
CHOLESTEROL: 229 mg/dL — AB (ref <200)
HDL: 37 mg/dL — ABNORMAL LOW (ref >40)
LDL CHOLESTEROL (CALC): 144 mg/dL — AB
Non-HDL Cholesterol (Calc): 192 mg/dL (calc) — ABNORMAL HIGH (ref <130)
TRIGLYCERIDES: 311 mg/dL — AB (ref <150)

## 2017-08-30 LAB — TSH: TSH: 0.59 m[IU]/L (ref 0.40–4.50)

## 2017-08-30 LAB — TESTOSTERONE: Testosterone: 801 ng/dL (ref 250–827)

## 2017-08-30 LAB — HEMOGLOBIN A1C
Hgb A1c MFr Bld: 5.4 % of total Hgb (ref <5.7)
MEAN PLASMA GLUCOSE: 108 (calc)
eAG (mmol/L): 6 (calc)

## 2017-09-01 ENCOUNTER — Telehealth: Payer: Self-pay

## 2017-09-01 DIAGNOSIS — E1165 Type 2 diabetes mellitus with hyperglycemia: Secondary | ICD-10-CM

## 2017-09-01 DIAGNOSIS — E782 Mixed hyperlipidemia: Secondary | ICD-10-CM

## 2017-09-01 DIAGNOSIS — E1140 Type 2 diabetes mellitus with diabetic neuropathy, unspecified: Secondary | ICD-10-CM

## 2017-09-01 DIAGNOSIS — IMO0002 Reserved for concepts with insufficient information to code with codable children: Secondary | ICD-10-CM

## 2017-09-01 DIAGNOSIS — Z79899 Other long term (current) drug therapy: Secondary | ICD-10-CM

## 2017-09-01 LAB — DRUG TOX MONITOR 1 W/CONF, ORAL FLD
AMPHETAMINES: NEGATIVE ng/mL (ref <10)
BUPRENORPHINE: NEGATIVE ng/mL (ref <0.10)
Barbiturates: NEGATIVE ng/mL (ref <10)
Benzodiazepines: NEGATIVE ng/mL (ref <0.50)
COCAINE: NEGATIVE ng/mL (ref <5.0)
Fentanyl: NEGATIVE ng/mL (ref <0.10)
Heroin Metabolite: NEGATIVE ng/mL (ref <1.0)
MARIJUANA: NEGATIVE ng/mL (ref <2.5)
MDMA: NEGATIVE ng/mL (ref <10)
MEPROBAMATE: NEGATIVE ng/mL (ref <2.5)
Methadone: NEGATIVE ng/mL (ref <5.0)
Nicotine Metabolite: NEGATIVE ng/mL (ref <5.0)
Opiates: NEGATIVE ng/mL (ref <2.5)
Phencyclidine: NEGATIVE ng/mL (ref <10)
TRAMADOL: NEGATIVE ng/mL (ref <5.0)
Tapentadol: NEGATIVE ng/mL (ref <5.0)
ZOLPIDEM: NEGATIVE ng/mL (ref <5.0)

## 2017-09-01 MED ORDER — ACCU-CHEK AVIVA PLUS W/DEVICE KIT: PACK | 0 refills | Status: DC

## 2017-09-01 MED ORDER — ACCU-CHEK FASTCLIX LANCET KIT: PACK | 0 refills | Status: DC

## 2017-09-01 MED ORDER — ATORVASTATIN CALCIUM 10 MG PO TABS: 10.0000 mg | ORAL_TABLET | Freq: Every day | ORAL | 3 refills | Status: DC

## 2017-09-01 MED ORDER — GLUCOSE BLOOD VI STRP: ORAL_STRIP | 6 refills | Status: DC

## 2017-09-01 MED ORDER — ACCU-CHEK FASTCLIX LANCETS MISC: 6 refills | Status: DC

## 2017-09-01 NOTE — Telephone Encounter (Signed)
An rx was sent to the pharmacy for lipitor, glucose meter, test strips and lancets.   Patient scheduled follow up lab appointment for 1 month. Labs have been ordered.

## 2017-09-01 NOTE — Telephone Encounter (Signed)
-----   Message from Carroll[place]ica Carter, [place] sent at 09/01/2017  9:44 AM EDT ----- lipitor 10mg  #30 take 1 tab po qhs with 3 RF; fasting lipid panel and ALT in 1 month (hyperlipidemia; high risk med use)

## 2017-09-15 ENCOUNTER — Telehealth: Payer: Self-pay

## 2017-09-15 MED ORDER — TRAMADOL HCL 50 MG PO TABS: 50.0000 mg | ORAL_TABLET | Freq: Three times a day (TID) | ORAL | 0 refills | Status: DC | PRN

## 2017-09-15 NOTE — Telephone Encounter (Signed)
Patient called to say that the pharmacy would not fill the oxycodone prescription that was sent on 08/29/17. I called the pharmacy to find out why it was on hold and was told the following:  The brand specific prescription that was sent for oxycodone HCI, abuse deter (OXAYDO) 5 mg tablets is NOT available. If provider wanted to change Rx to generic oxycodone 5mg  then they can fill the prescription.   Patient stated that he would rather try tramadol to see if it would control his pain because he would like to stop taking the oxycodone.   Please advise if a Rx for tramadol can be sent and oxycodone Rx cancelled.

## 2017-09-15 NOTE — Telephone Encounter (Signed)
Ok, let's d/c oxycodone and begin tramadol 50mg  po tid prn severe pain.

## 2017-09-15 NOTE — Telephone Encounter (Signed)
Medication list has been updated and prescription for tramadol has been called in to the pharmacy. The prescription for the oxycodone was discontinued verbally while speaking with the pharmacist.   Patient is aware of the change.

## 2017-09-22 ENCOUNTER — Other Ambulatory Visit: Payer: Self-pay | Admitting: *Deleted

## 2017-09-22 MED ORDER — TRAMADOL HCL 50 MG PO TABS: 50.0000 mg | ORAL_TABLET | Freq: Three times a day (TID) | ORAL | 0 refills | Status: DC | PRN

## 2017-09-22 NOTE — Telephone Encounter (Signed)
[facility]. On 09/15/17 Dr. Renato Gailseed approved.

## 2017-10-03 ENCOUNTER — Other Ambulatory Visit: Payer: BLUE CROSS/BLUE SHIELD

## 2017-10-30 ENCOUNTER — Other Ambulatory Visit: Payer: Self-pay | Admitting: *Deleted

## 2017-10-30 MED ORDER — TRAMADOL HCL 50 MG PO TABS: 50.0000 mg | ORAL_TABLET | Freq: Three times a day (TID) | ORAL | 0 refills | Status: DC | PRN

## 2017-10-30 NOTE — Telephone Encounter (Signed)
Patient requested refill. Phoned to pharmacy.  

## 2017-11-27 ENCOUNTER — Ambulatory Visit: Payer: BLUE CROSS/BLUE SHIELD | Admitting: Nurse Practitioner

## 2017-11-28 ENCOUNTER — Other Ambulatory Visit: Payer: Self-pay | Admitting: *Deleted

## 2017-11-28 MED ORDER — TRAMADOL HCL 50 MG PO TABS: 50.0000 mg | ORAL_TABLET | Freq: Three times a day (TID) | ORAL | 0 refills | Status: DC | PRN

## 2017-11-28 NOTE — Telephone Encounter (Signed)
Patient called requesting refill [place] Verified LR: 10/30/17 Phone Rx to Pharmacy.

## 2017-12-01 ENCOUNTER — Other Ambulatory Visit: Payer: Self-pay | Admitting: Nurse Practitioner

## 2017-12-01 DIAGNOSIS — R7989 Other specified abnormal findings of blood chemistry: Secondary | ICD-10-CM

## 2017-12-01 NOTE — Telephone Encounter (Signed)
[place] Database verified and compliance confirmed   Last filled in database on 10/20/17

## 2017-12-11 ENCOUNTER — Encounter: Payer: Self-pay | Admitting: Nurse Practitioner

## 2017-12-11 ENCOUNTER — Ambulatory Visit (INDEPENDENT_AMBULATORY_CARE_PROVIDER_SITE_OTHER): Payer: BLUE CROSS/BLUE SHIELD | Admitting: Nurse Practitioner

## 2017-12-11 VITALS — BP 128/78 | HR 73 | Temp 98.1°F | Ht 75.0 in | Wt 269.8 lb

## 2017-12-11 DIAGNOSIS — IMO0002 Reserved for concepts with insufficient information to code with codable children: Secondary | ICD-10-CM

## 2017-12-11 DIAGNOSIS — E1165 Type 2 diabetes mellitus with hyperglycemia: Secondary | ICD-10-CM | POA: Diagnosis not present

## 2017-12-11 DIAGNOSIS — E782 Mixed hyperlipidemia: Secondary | ICD-10-CM

## 2017-12-11 DIAGNOSIS — M546 Pain in thoracic spine: Secondary | ICD-10-CM

## 2017-12-11 DIAGNOSIS — E1140 Type 2 diabetes mellitus with diabetic neuropathy, unspecified: Secondary | ICD-10-CM | POA: Diagnosis not present

## 2017-12-11 MED ORDER — ATORVASTATIN CALCIUM 10 MG PO TABS: 10.0000 mg | ORAL_TABLET | Freq: Every day | ORAL | 5 refills | Status: DC

## 2017-12-11 NOTE — Patient Instructions (Signed)
Start melatonin 3-6 mg by mouth every night for sleep Bedtime routine  Follow up this week for FASTING LAB WORK

## 2017-12-11 NOTE — Progress Notes (Signed)
Careteam: Patient Care Team: Lauree Chandler, NP as PCP - General (Geriatric Medicine) Suella Broad, MD as Consulting Physician (Physical Medicine and Rehabilitation) Berle Mull, MD as Consulting Physician (Sports Medicine)  Advanced Directive information    No Known Allergies  Chief Complaint  Patient presents with  . Medical Management of Chronic Issues    Pt is being seen for a routine visit. Pt wants to discuss getting a muscle relaxer and extended release tramadol (rx filled on 11/28/17)     HPI: Patient is a 47 y.o. male seen in the office today for routine follow up.    went through worker comp due to right shoulder injury, saw Dr Tonita Cong. Then noted to have disc herniation T7-T8 and recommended to treat conservatively. Using tramadol 50 mg tablet TID, feels like he could benefit from more medication. Has not done any physical therapy for his back. Recently went back to work and now pain is worse.  Taking a deep breath and movement exacerbates symptoms. Was able to play golf this weekend. Most of the time a 4/10 at its worse a 7/10. Knees also bothering him.  Not currently exercising, walks around the neighbor.  Has been off of oxycodone for 6 months.   Hyperlipidemia- been on Lipitor since July, forgets to take it occasionally.    Has a hard time going to sleep, will be tired but then gets a second wind.   Review of Systems:  Review of Systems  Constitutional: Negative for chills, fever and weight loss.  HENT: Negative for tinnitus.   Respiratory: Negative for cough, sputum production and shortness of breath.   Cardiovascular: Negative for chest pain, palpitations and leg swelling.  Gastrointestinal: Negative for abdominal pain, constipation, diarrhea and heartburn.  Genitourinary: Negative for dysuria, frequency and urgency.  Musculoskeletal: Positive for back pain. Negative for falls, joint pain and myalgias.  Skin: Negative.   Neurological: Negative for  dizziness and headaches.  Psychiatric/Behavioral: Negative for depression and memory loss. The patient does not have insomnia.     Past Medical History:  Diagnosis Date  . Anxiety   . Arthritis   . Depression   . Diabetes mellitus without complication ([place])   . GERD (gastroesophageal reflux disease)   . Heartburn   . Hematuria, unspecified   . Lumbago   . Other abnormal blood chemistry   . Other malaise and fatigue   . Palpitations   . Unspecified vitamin D deficiency    Past Surgical History:  Procedure Laterality Date  . SHOULDER ARTHROSCOPY WITH ROTATOR CUFF REPAIR AND SUBACROMIAL DECOMPRESSION Right 12/22/2016   Procedure: Right shoulder arthroscopy, subacromial decompression, mini open rotator cuff repair;  Surgeon: Susa Day, MD;  Location: [facility];  Service: Orthopedics;  Laterality: Right;  120 mins   Social History:   reports that he has quit smoking. His smoking use included cigarettes. He started smoking about 8 months ago. He has a 15.00 pack-year smoking history. He has never used smokeless tobacco. He reports that he drinks alcohol. He reports that he does not use drugs.  History reviewed. No pertinent family history.  Medications: Patient's Medications  New Prescriptions   No medications on file  Previous Medications   ACCU-CHEK FASTCLIX LANCETS MISC    Use as directed to check blood sugar daily. Dx: E11.40   ATORVASTATIN (LIPITOR) 10 MG TABLET    Take 1 tablet (10 mg total) by mouth at bedtime.   BLOOD GLUCOSE MONITORING SUPPL (ACCU-CHEK AVIVA PLUS) W/DEVICE KIT  Use to check blood sugar daily. Dx: E11.40   GLUCOSE BLOOD (ACCU-CHEK AVIVA PLUS) TEST STRIP    Use to check blood sugar daily. Dx: E11.40   LANCETS MISC. (ACCU-CHEK FASTCLIX LANCET) KIT    Use as directed to check blood sugar daily. Dx: E11.40   MULTIPLE VITAMINS-MINERALS (MULTIVITAMIN PO)    Take 1 tablet by mouth daily.   TESTOSTERONE 30 MG/ACT SOLN    APPLY ONE PUMP UNDER THE ARM DAILY    TRAMADOL (ULTRAM) 50 MG TABLET    Take 1 tablet (50 mg total) by mouth 3 (three) times daily as needed for severe pain.  Modified Medications   No medications on file  Discontinued Medications   DOCUSATE SODIUM (COLACE) 100 MG CAPSULE    Take 1 capsule (100 mg total) by mouth 2 (two) times daily as needed for mild constipation.   METHOCARBAMOL (ROBAXIN) 500 MG TABLET    Take 1 tablet (500 mg total) by mouth every 6 (six) hours as needed for muscle spasms.     Physical Exam:  Vitals:   12/11/17 1431  BP: 128/78  Pulse: 73  Temp: 98.1 F (36.7 C)  TempSrc: Oral  SpO2: 98%  Weight: 269 lb 12.8 oz (122.4 kg)  Height: _0  (1.905 m)   Body mass index is 33.72 kg/m.  Physical Exam  Constitutional: He is oriented to person, place, and time. He appears well-developed and well-nourished.  HENT:  Head: Normocephalic and atraumatic.  Eyes: Pupils are equal, round, and reactive to light. EOM are normal.  Neck: Normal range of motion. Neck supple.  Cardiovascular: Normal rate, regular rhythm and normal heart sounds.  Pulmonary/Chest: Effort normal and breath sounds normal.  Musculoskeletal: He exhibits no edema.       Right shoulder: He exhibits decreased range of motion.       Left shoulder: Normal.       Cervical back: Normal.       Thoracic back: Normal.       Lumbar back: Normal.  Neurological: He is alert and oriented to person, place, and time.  Skin: Skin is warm and dry.  Psychiatric: He has a normal mood and affect. His behavior is normal. Thought content normal.    Labs reviewed: Basic Metabolic Panel: Recent Labs    12/20/16 1329 08/29/17 1230  NA 139 138  K 4.2 4.2  CL 103 101  CO2 27 29  GLUCOSE 93 86  BUN 16 17  CREATININE 0.83 0.88  CALCIUM 9.6 9.8  TSH  --  0.59   Liver Function Tests: Recent Labs    08/29/17 1230  AST 18  ALT 19  BILITOT 0.6  PROT 7.5   No results for input(s): LIPASE, AMYLASE in the last 8760 hours. No results for input(s):  AMMONIA in the last 8760 hours. CBC: Recent Labs    12/20/16 1329  WBC 10.0  HGB 15.2  HCT 44.2  MCV 79.6  PLT 248   Lipid Panel: Recent Labs    01/23/17 0921 08/29/17 1230  CHOL 210* 229*  HDL 50 37*  LDLCALC 128* 144*  TRIG 181* 311*  CHOLHDL 4.2 6.2*   TSH: Recent Labs    08/29/17 1230  TSH 0.59   A1C: Lab Results  Component Value Date   HGBA1C 5.4 08/29/2017     Assessment/Plan 1. Thoracic spine pain - disc herniation T7-T8 treating conservatively. -continues on tramadol TID - Ambulatory referral to Physical Therapy  2. Mixed hyperlipidemia -has not taken  consistently but attempts to take nightly, encouraged to move to a time he would take more routinely -continue dietary modifications.  - atorvastatin (LIPITOR) 10 MG tablet; Take 1 tablet (10 mg total) by mouth at bedtime.  Dispense: 30 tablet; Refill: 5 - COMPLETE METABOLIC PANEL WITH GFR; Future - Lipid Panel; Future  3. DM type 2, uncontrolled, with neuropathy ([place]) -A1c at goal on last lab, continue dietary modifications    Next appt: 12/13/2017 (fasting lab work)  Janett Billow K. [place], [facility] [phone]

## 2017-12-13 ENCOUNTER — Other Ambulatory Visit: Payer: BLUE CROSS/BLUE SHIELD

## 2017-12-13 DIAGNOSIS — E782 Mixed hyperlipidemia: Secondary | ICD-10-CM

## 2017-12-14 LAB — COMPLETE METABOLIC PANEL WITH GFR
AG RATIO: 1.5 (calc) (ref 1.0–2.5)
ALT: 22 U/L (ref 9–46)
AST: 19 U/L (ref 10–40)
Albumin: 4.3 g/dL (ref 3.6–5.1)
Alkaline phosphatase (APISO): 106 U/L (ref 40–115)
BILIRUBIN TOTAL: 0.6 mg/dL (ref 0.2–1.2)
BUN: 14 mg/dL (ref 7–25)
CO2: 29 mmol/L (ref 20–32)
Calcium: 9.8 mg/dL (ref 8.6–10.3)
Chloride: 101 mmol/L (ref 98–110)
Creat: 0.82 mg/dL (ref 0.60–1.35)
GFR, EST NON AFRICAN AMERICAN: 105 mL/min/{1.73_m2} (ref >=60)
GFR, Est African American: 122 mL/min/{1.73_m2} (ref >=60)
GLUCOSE: 102 mg/dL — AB (ref 65–99)
Globulin: 2.9 g/dL (calc) (ref 1.9–3.7)
Potassium: 4.6 mmol/L (ref 3.5–5.3)
SODIUM: 138 mmol/L (ref 135–146)
TOTAL PROTEIN: 7.2 g/dL (ref 6.1–8.1)

## 2017-12-14 LAB — LIPID PANEL
Cholesterol: 197 mg/dL (ref <200)
HDL: 40 mg/dL — AB (ref >40)
LDL Cholesterol (Calc): 126 mg/dL (calc) — ABNORMAL HIGH
Non-HDL Cholesterol (Calc): 157 mg/dL (calc) — ABNORMAL HIGH (ref <130)
TRIGLYCERIDES: 191 mg/dL — AB (ref <150)
Total CHOL/HDL Ratio: 4.9 (calc) (ref <5.0)

## 2017-12-15 ENCOUNTER — Other Ambulatory Visit: Payer: Self-pay

## 2017-12-15 DIAGNOSIS — E782 Mixed hyperlipidemia: Secondary | ICD-10-CM

## 2017-12-15 DIAGNOSIS — IMO0002 Reserved for concepts with insufficient information to code with codable children: Secondary | ICD-10-CM

## 2017-12-15 DIAGNOSIS — E1165 Type 2 diabetes mellitus with hyperglycemia: Secondary | ICD-10-CM

## 2017-12-15 DIAGNOSIS — E1140 Type 2 diabetes mellitus with diabetic neuropathy, unspecified: Secondary | ICD-10-CM

## 2017-12-15 MED ORDER — ATORVASTATIN CALCIUM 20 MG PO TABS: 20.0000 mg | ORAL_TABLET | Freq: Every day | ORAL | 3 refills | Status: DC

## 2017-12-27 ENCOUNTER — Other Ambulatory Visit: Payer: Self-pay | Admitting: *Deleted

## 2017-12-27 MED ORDER — TRAMADOL HCL 50 MG PO TABS: 50.0000 mg | ORAL_TABLET | Freq: Three times a day (TID) | ORAL | 0 refills | Status: DC | PRN

## 2017-12-27 NOTE — Telephone Encounter (Signed)
Patient requested refill [place]CC[place] Database Verified LR: 11/28/2017 Rx phoned to pharmacy.

## 2018-01-24 ENCOUNTER — Other Ambulatory Visit: Payer: Self-pay | Admitting: *Deleted

## 2018-01-24 MED ORDER — TRAMADOL HCL 50 MG PO TABS: 50.0000 mg | ORAL_TABLET | Freq: Three times a day (TID) | ORAL | 0 refills | Status: DC | PRN

## 2018-01-24 NOTE — Telephone Encounter (Signed)
Patient requested refill. Phoned to pharmacy.  

## 2018-02-20 ENCOUNTER — Other Ambulatory Visit: Payer: Self-pay

## 2018-02-20 MED ORDER — TRAMADOL HCL 50 MG PO TABS: 50.0000 mg | ORAL_TABLET | Freq: Three times a day (TID) | ORAL | 0 refills | Status: DC | PRN

## 2018-02-20 NOTE — Telephone Encounter (Signed)
Patient called requesting a refill on Tramadol. [facility]

## 2018-03-07 ENCOUNTER — Other Ambulatory Visit: Payer: Self-pay | Admitting: *Deleted

## 2018-03-07 DIAGNOSIS — R7989 Other specified abnormal findings of blood chemistry: Secondary | ICD-10-CM

## 2018-03-07 MED ORDER — TESTOSTERONE 30 MG/ACT TD SOLN: TRANSDERMAL | 0 refills | Status: DC

## 2018-03-07 NOTE — Telephone Encounter (Signed)
He will need OV to discuss methocarbamol, can refill testosterone at this time.

## 2018-03-07 NOTE — Telephone Encounter (Signed)
Patient called requesting refill on Methocarbamol and Testosterone. Methocarbamol is Not in current medication list. Discontinued 12/11/17. Please Advise.

## 2018-03-08 MED ORDER — TESTOSTERONE 30 MG/ACT TD SOLN: TRANSDERMAL | 0 refills | Status: DC

## 2018-03-08 NOTE — Telephone Encounter (Signed)
Patient notified and will call back for an appointment.

## 2018-03-19 ENCOUNTER — Other Ambulatory Visit: Payer: BLUE CROSS/BLUE SHIELD

## 2018-03-20 ENCOUNTER — Other Ambulatory Visit: Payer: Self-pay | Admitting: *Deleted

## 2018-03-20 MED ORDER — TRAMADOL HCL 50 MG PO TABS: 50.0000 mg | ORAL_TABLET | Freq: Three times a day (TID) | ORAL | 0 refills | Status: DC | PRN

## 2018-03-20 NOTE — Telephone Encounter (Signed)
Patient requested refill. Faxed.  

## 2018-03-22 ENCOUNTER — Other Ambulatory Visit: Payer: Self-pay | Admitting: Nurse Practitioner

## 2018-03-27 ENCOUNTER — Other Ambulatory Visit: Payer: BLUE CROSS/BLUE SHIELD

## 2018-04-17 ENCOUNTER — Other Ambulatory Visit: Payer: Self-pay | Admitting: *Deleted

## 2018-04-17 MED ORDER — TRAMADOL HCL 50 MG PO TABS: 50.0000 mg | ORAL_TABLET | Freq: Three times a day (TID) | ORAL | 0 refills | Status: DC | PRN

## 2018-04-17 NOTE — Telephone Encounter (Signed)
Patient requested refill. Phoned to pharmacy NCCS[place] Database Verified.

## 2018-05-15 ENCOUNTER — Other Ambulatory Visit: Payer: Self-pay | Admitting: Nurse Practitioner

## 2018-05-15 NOTE — Telephone Encounter (Signed)
Patient requested refill [place]CCS[place] Database Verified LR: 04/17/18 Pended Rx and sent to [facility] for Approval.

## 2018-05-15 NOTE — Telephone Encounter (Signed)
This refill is early (not even due until the 26th).

## 2018-05-15 NOTE — Telephone Encounter (Signed)
Steele Sizer, pharmacist called and stated patient has been calling them for a refill. Rx is pended for Jessica's approval.

## 2018-05-16 NOTE — Telephone Encounter (Signed)
Patient aware rx sent to pharmacy.  

## 2018-06-11 ENCOUNTER — Other Ambulatory Visit: Payer: Self-pay

## 2018-06-11 ENCOUNTER — Encounter: Payer: Self-pay | Admitting: Nurse Practitioner

## 2018-06-11 ENCOUNTER — Telehealth: Payer: Self-pay

## 2018-06-11 ENCOUNTER — Ambulatory Visit (INDEPENDENT_AMBULATORY_CARE_PROVIDER_SITE_OTHER): Payer: BLUE CROSS/BLUE SHIELD | Admitting: Nurse Practitioner

## 2018-06-11 DIAGNOSIS — J069 Acute upper respiratory infection, unspecified: Secondary | ICD-10-CM

## 2018-06-11 NOTE — Telephone Encounter (Signed)
-----   Message from Marcy Siren sent at 06/11/2018 11:31 AM EDT ----- Regarding: [id number] Contact: [phone] Taylor Kidd stated that he just had televisit and has additional questions regarding quarantine time frame.  He needs to discuss this with [name].  Also he request that you email him the information on Covid-19 instead of mailing it to him.  His email address is [email or web address]

## 2018-06-11 NOTE — Telephone Encounter (Signed)
Patient returned call and verbalized understanding of Jessica's instructions to view mychart. Patient questioned what the work note stated. I read note to patient. Patient appeared satisfied with what work note stated and will call back if needed.

## 2018-06-11 NOTE — Progress Notes (Signed)
This service is provided via telemedicine  No vital signs collected/recorded due to the encounter was a telemedicine visit.   Location of patient (ex: home, work): Home  Patient consents to a telephone visit:  Yes  Location of the provider (ex: office, home):  [facility], Office   Name of any referring provider:  N/A  Names of all persons participating in the telemedicine service and their role in the encounter: Taylor Kidd B/CMA, Taylor Mustache, NP, and Patient   Time spent on call: 5 min with Medical Assistant   Virtual Visit via Telephone Note  I connected with Taylor Kidd on 06/11/18 at 10:30 AM EDT by telephone and verified that I am speaking with the correct person using two identifiers.   I discussed the limitations, risks, security and privacy concerns of performing an evaluation and management service by telephone and the availability of in person appointments. I also discussed with the patient that there may be a patient responsible charge related to this service. The patient expressed understanding and agreed to proceed.     Careteam: Patient Care Team: Taylor Chandler, NP as PCP - General (Geriatric Medicine) Taylor Broad, MD as Consulting Physician (Physical Medicine and Rehabilitation) Taylor Mull, MD as Consulting Physician (Sports Medicine)  Advanced Directive information    No Known Allergies  Chief Complaint  Patient presents with  . Acute Visit    Patient complain of cough, fever(99.8), sore throat, and no energy. Patient was around family members with similar symptoms. Televisit      HPI: Patient is a 48 y.o. male via tele-visit due to COVID-19.  Pt had a sore throat last week. Then his family members (who live with him) got sick, fevers 101.  Sore throat came back then had no energy, dry cough, runny nose, body aches.  Currently no fever.  Last night temp 99.4.  Has been taking tylenol 500 mg 2 tablets as needed.  Has not  been in contact with anyone with known COVID-19.  Still working at [employer].      Review of Systems:  Review of Systems  Constitutional: Positive for fever and malaise/fatigue. Negative for chills and weight loss.  HENT: Positive for congestion and sore throat. Negative for ear pain.   Respiratory: Positive for cough. Negative for sputum production, shortness of breath and wheezing.   Cardiovascular: Negative for chest pain and palpitations.  Neurological: Negative for dizziness and headaches.  Endo/Heme/Allergies: Negative for environmental allergies.    Past Medical History:  Diagnosis Date  . Anxiety   . Arthritis   . Depression   . Diabetes mellitus without complication ([place])   . GERD (gastroesophageal reflux disease)   . Heartburn   . Hematuria, unspecified   . Lumbago   . Other abnormal blood chemistry   . Other malaise and fatigue   . Palpitations   . Unspecified vitamin D deficiency    Past Surgical History:  Procedure Laterality Date  . SHOULDER ARTHROSCOPY WITH ROTATOR CUFF REPAIR AND SUBACROMIAL DECOMPRESSION Right 12/22/2016   Procedure: Right shoulder arthroscopy, subacromial decompression, mini open rotator cuff repair;  Surgeon: Susa Day, MD;  Location: [facility];  Service: Orthopedics;  Laterality: Right;  120 mins   Social History:   reports that he has quit smoking. His smoking use included cigarettes. He started smoking about 14 months ago. He has a 15.00 pack-year smoking history. He has never used smokeless tobacco. He reports current alcohol use. He reports that he does not use  drugs.  History reviewed. No pertinent family history.  Medications: Patient's Medications  New Prescriptions   No medications on file  Previous Medications   ACCU-CHEK FASTCLIX LANCETS MISC    Use as directed to check blood sugar daily. Dx: E11.40   ACETAMINOPHEN (TYLENOL) 500 MG TABLET    Take 500 mg by mouth as needed.   ATORVASTATIN (LIPITOR) 20 MG TABLET    TAKE 1 TABLET  BY MOUTH EVERY DAY   BLOOD GLUCOSE MONITORING SUPPL (ACCU-CHEK AVIVA PLUS) W/DEVICE KIT    Use to check blood sugar daily. Dx: E11.40   GLUCOSE BLOOD (ACCU-CHEK AVIVA PLUS) TEST STRIP    Use to check blood sugar daily. Dx: E11.40   L-ARGININE 500 MG TABS    2 by mouth 2 times daily   LANCETS MISC. (ACCU-CHEK FASTCLIX LANCET) KIT    Use as directed to check blood sugar daily. Dx: E11.40   MULTIPLE VITAMINS-MINERALS (MULTIVITAMIN PO)    Take 1 tablet by mouth daily.   TESTOSTERONE 30 MG/ACT SOLN    Apply one pump under the arm once daily   TRAMADOL (ULTRAM) 50 MG TABLET    TAKE 1 TABLET BY MOUTH 3 TIMES A DAY AS NEEDED FOR PAIN  Modified Medications   No medications on file  Discontinued Medications   No medications on file     Physical Exam: Unable due to tele-visit.   Labs reviewed: Basic Metabolic Panel: Recent Labs    08/29/17 1230 12/13/17 0834  NA 138 138  K 4.2 4.6  CL 101 101  CO2 29 29  GLUCOSE 86 102*  BUN 17 14  CREATININE 0.88 0.82  CALCIUM 9.8 9.8  TSH 0.59  --    Liver Function Tests: Recent Labs    08/29/17 1230 12/13/17 0834  AST 18 19  ALT 19 22  BILITOT 0.6 0.6  PROT 7.5 7.2   No results for input(s): LIPASE, AMYLASE in the last 8760 hours. No results for input(s): AMMONIA in the last 8760 hours. CBC: No results for input(s): WBC, NEUTROABS, HGB, HCT, MCV, PLT in the last 8760 hours. Lipid Panel: Recent Labs    08/29/17 1230 12/13/17 0834  CHOL 229* 197  HDL 37* 40*  LDLCALC 144* 126*  TRIG 311* 191*  CHOLHDL 6.2* 4.9   TSH: Recent Labs    08/29/17 1230  TSH 0.59   A1C: Lab Results  Component Value Date   HGBA1C 5.4 08/29/2017     Assessment/Plan 1. Viral upper respiratory tract infection Viral illness has gone through the house hold. Possible COVID-10 no known exposure however similar symptoms with high fevers and respiratory symptoms. Reports family is staying home but he still works as a Oncologist.  Supportive care at this  time with tylenol 500 mg 1-2 tablets every 8 hours as needed for sore throat and fever -increase fluid intake -mucinex by mouth twice daily with full glass of water for congestion -to self quarantine for at least 7 days since symptoms onset AND 3 days fever free without antipyretics (Tylenol or Ibuprofen) AND improvement in respiratory symptoms. Work note provided.    Carlos American. Harle Battiest  [facility] [phone]    Follow Up Instructions:    I discussed the assessment and treatment plan with the patient. The patient was provided an opportunity to ask questions and all were answered. The patient agreed with the plan and demonstrated an understanding of the instructions.   The patient was advised to  call back or seek an in-person evaluation if the symptoms worsen or if the condition fails to improve as anticipated.  I provided 13 minutes of non-face-to-face time during this encounter.  avs printed  Taylor Chandler, NP

## 2018-06-11 NOTE — Telephone Encounter (Signed)
I will call patient to inquire about additional concerns after lunch. If patient needs to speak with someone prior I will have clinical intake follow-up on request   Per [name] patient can access his work note and AVS via Clinical cytogeneticist. We will not email for it is a [place] violation.

## 2018-06-11 NOTE — Telephone Encounter (Signed)
I called patient, left message informing patient to review Mychart and call if he still question to call and speak with Clinical Intake-Anita   S.Chrae B/CMA

## 2018-06-11 NOTE — Patient Instructions (Addendum)
tylenol 500 mg 1-2 tablets every 8 hours as needed for sore throat and fever -increase fluid intake -mucinex by mouth twice daily with full glass of water for congestion      Person Under Monitoring Name: Taylor Kidd  Location: [address] [place] [address]   Infection Prevention Recommendations for Individuals Confirmed to have, or Being Evaluated for, 2019 Novel Coronavirus (COVID-19) Infection Who Receive Care at Home  Individuals who are confirmed to have, or are being evaluated for, COVID-19 should follow the prevention steps below until a healthcare provider or local or state health department says they can return to normal activities.  Stay home except to get medical care You should restrict activities outside your home, except for getting medical care. Do not go to work, school, or public areas, and do not use public transportation or taxis.  Call ahead before visiting your doctor Before your medical appointment, call the healthcare provider and tell them that you have, or are being evaluated for, COVID-19 infection. This will help the healthcare provider's office take steps to keep other people from getting infected. Ask your healthcare provider to call the local or state health department.  Monitor your symptoms Seek prompt medical attention if your illness is worsening (e.g., difficulty breathing). Before going to your medical appointment, call the healthcare provider and tell them that you have, or are being evaluated for, COVID-19 infection. Ask your healthcare provider to call the local or state health department.  Wear a facemask You should wear a facemask that covers your nose and mouth when you are in the same room with other people and when you visit a healthcare provider. People who live with or visit you should also wear a facemask while they are in the same room with you.  Separate yourself from other people in your home As much as  possible, you should stay in a different room from other people in your home. Also, you should use a separate bathroom, if available.  Avoid sharing household items You should not share dishes, drinking glasses, cups, eating utensils, towels, bedding, or other items with other people in your home. After using these items, you should wash them thoroughly with soap and water.  Cover your coughs and sneezes Cover your mouth and nose with a tissue when you cough or sneeze, or you can cough or sneeze into your sleeve. Throw used tissues in a lined trash can, and immediately wash your hands with soap and water for at least 20 seconds or use an alcohol-based hand rub.  Wash your [employer] your hands often and thoroughly with soap and water for at least 20 seconds. You can use an alcohol-based hand sanitizer if soap and water are not available and if your hands are not visibly dirty. Avoid touching your eyes, nose, and mouth with unwashed hands.   Prevention Steps for Caregivers and Household Members of Individuals Confirmed to have, or Being Evaluated for, COVID-19 Infection Being Cared for in the Home  If you live with, or provide care at home for, a person confirmed to have, or being evaluated for, COVID-19 infection please follow these guidelines to prevent infection:  Follow healthcare provider's instructions Make sure that you understand and can help the patient follow any healthcare provider instructions for all care.  Provide for the patient's basic needs You should help the patient with basic needs in the home and provide support for getting groceries, prescriptions, and other personal needs.  Monitor the  patient's symptoms If they are getting sicker, call his or her medical provider and tell them that the patient has, or is being evaluated for, COVID-19 infection. This will help the healthcare provider's office take steps to keep other people from getting infected. Ask the  healthcare provider to call the local or state health department.  Limit the number of people who have contact with the patient  If possible, have only one caregiver for the patient.  Other household members should stay in another home or place of residence. If this is not possible, they should stay  in another room, or be separated from the patient as much as possible. Use a separate bathroom, if available.  Restrict visitors who do not have an essential need to be in the home.  Keep older adults, very young children, and other sick people away from the patient Keep older adults, very young children, and those who have compromised immune systems or chronic health conditions away from the patient. This includes people with chronic heart, lung, or kidney conditions, diabetes, and cancer.  Ensure good ventilation Make sure that shared spaces in the home have good air flow, such as from an air conditioner or an opened window, weather permitting.  Wash your hands often  Wash your hands often and thoroughly with soap and water for at least 20 seconds. You can use an alcohol based hand sanitizer if soap and water are not available and if your hands are not visibly dirty.  Avoid touching your eyes, nose, and mouth with unwashed hands.  Use disposable paper towels to dry your hands. If not available, use dedicated cloth towels and replace them when they become wet.  Wear a facemask and gloves  Wear a disposable facemask at all times in the room and gloves when you touch or have contact with the patient's blood, body fluids, and/or secretions or excretions, such as sweat, saliva, sputum, nasal mucus, vomit, urine, or feces.  Ensure the mask fits over your nose and mouth tightly, and do not touch it during use.  Throw out disposable facemasks and gloves after using them. Do not reuse.  Wash your hands immediately after removing your facemask and gloves.  If your personal clothing becomes  contaminated, carefully remove clothing and launder. Wash your hands after handling contaminated clothing.  Place all used disposable facemasks, gloves, and other waste in a lined container before disposing them with other household waste.  Remove gloves and wash your hands immediately after handling these items.  Do not share dishes, glasses, or other household items with the patient  Avoid sharing household items. You should not share dishes, drinking glasses, cups, eating utensils, towels, bedding, or other items with a patient who is confirmed to have, or being evaluated for, COVID-19 infection.  After the person uses these items, you should wash them thoroughly with soap and water.  Wash laundry thoroughly  Immediately remove and wash clothes or bedding that have blood, body fluids, and/or secretions or excretions, such as sweat, saliva, sputum, nasal mucus, vomit, urine, or feces, on them.  Wear gloves when handling laundry from the patient.  Read and follow directions on labels of laundry or clothing items and detergent. In general, wash and dry with the warmest temperatures recommended on the label.  Clean all areas the individual has used often  Clean all touchable surfaces, such as counters, tabletops, doorknobs, bathroom fixtures, toilets, phones, keyboards, tablets, and bedside tables, every day. Also, clean any surfaces that may have  blood, body fluids, and/or secretions or excretions on them.  Wear gloves when cleaning surfaces the patient has come in contact with.  Use a diluted bleach solution (e.g., dilute bleach with 1 part bleach and 10 parts water) or a household disinfectant with a label that says EPA-registered for coronaviruses. To make a bleach solution at home, add 1 tablespoon of bleach to 1 quart (4 cups) of water. For a larger supply, add  cup of bleach to 1 gallon (16 cups) of water.  Read labels of cleaning products and follow recommendations provided on  product labels. Labels contain instructions for safe and effective use of the cleaning product including precautions you should take when applying the product, such as wearing gloves or eye protection and making sure you have good ventilation during use of the product.  Remove gloves and wash hands immediately after cleaning.  Monitor yourself for signs and symptoms of illness Caregivers and household members are considered close contacts, should monitor their health, and will be asked to limit movement outside of the home to the extent possible. Follow the monitoring steps for close contacts listed on the symptom monitoring form.   ? If you have additional questions, contact your local health department or call the epidemiologist on call at [phone] (available 24/7). ? This guidance is subject to change. For the most up-to-date guidance from [facility], please refer to their website: [email or web address]

## 2018-06-14 ENCOUNTER — Other Ambulatory Visit: Payer: Self-pay | Admitting: *Deleted

## 2018-06-14 MED ORDER — TRAMADOL HCL 50 MG PO TABS: ORAL_TABLET | ORAL | 0 refills | Status: DC

## 2018-06-14 NOTE — Telephone Encounter (Signed)
Patient requested refill

## 2018-07-12 ENCOUNTER — Other Ambulatory Visit: Payer: Self-pay | Admitting: Nurse Practitioner

## 2018-07-13 ENCOUNTER — Other Ambulatory Visit: Payer: Self-pay

## 2018-07-13 NOTE — Telephone Encounter (Deleted)
[facility] Database verified and compliance confirmed   Last refilled 06/14/2018

## 2018-07-13 NOTE — Telephone Encounter (Signed)
Opened in error

## 2018-08-09 ENCOUNTER — Other Ambulatory Visit: Payer: Self-pay | Admitting: *Deleted

## 2018-08-09 DIAGNOSIS — R7989 Other specified abnormal findings of blood chemistry: Secondary | ICD-10-CM

## 2018-08-09 MED ORDER — TESTOSTERONE 30 MG/ACT TD SOLN: TRANSDERMAL | 0 refills | Status: DC

## 2018-08-09 MED ORDER — TRAMADOL HCL 50 MG PO TABS: ORAL_TABLET | ORAL | 0 refills | Status: DC

## 2018-08-09 NOTE — Telephone Encounter (Signed)
Patient requested refill [place] Verified LR: 07/12/2018

## 2018-09-05 ENCOUNTER — Other Ambulatory Visit: Payer: Self-pay | Admitting: *Deleted

## 2018-09-05 DIAGNOSIS — R7989 Other specified abnormal findings of blood chemistry: Secondary | ICD-10-CM

## 2018-09-05 MED ORDER — TESTOSTERONE 30 MG/ACT TD SOLN: TRANSDERMAL | 0 refills | Status: DC

## 2018-09-05 MED ORDER — TRAMADOL HCL 50 MG PO TABS: ORAL_TABLET | ORAL | 0 refills | Status: DC

## 2018-09-05 NOTE — Telephone Encounter (Signed)
Patient called requesting refill  Pended Rx and sent to [facility] for approval.  

## 2018-10-03 ENCOUNTER — Other Ambulatory Visit: Payer: Self-pay | Admitting: *Deleted

## 2018-10-03 DIAGNOSIS — R7989 Other specified abnormal findings of blood chemistry: Secondary | ICD-10-CM

## 2018-10-03 MED ORDER — TRAMADOL HCL 50 MG PO TABS: ORAL_TABLET | ORAL | 0 refills | Status: DC

## 2018-10-03 MED ORDER — TESTOSTERONE 30 MG/ACT TD SOLN: TRANSDERMAL | 0 refills | Status: DC

## 2018-10-03 NOTE — Telephone Encounter (Signed)
Patient requested refill Epic LR: 09/05/18 Pended Rx and sent to [facility] for approval.

## 2018-10-03 NOTE — Telephone Encounter (Signed)
Refill provided but he needs to make appt for follow up, he is overdue

## 2018-10-03 NOTE — Telephone Encounter (Signed)
LM on VM of Jessica's response.

## 2018-10-18 ENCOUNTER — Encounter: Payer: Self-pay | Admitting: Nurse Practitioner

## 2018-10-18 ENCOUNTER — Other Ambulatory Visit: Payer: Self-pay

## 2018-10-18 ENCOUNTER — Ambulatory Visit (INDEPENDENT_AMBULATORY_CARE_PROVIDER_SITE_OTHER): Payer: BC Managed Care – PPO | Admitting: Nurse Practitioner

## 2018-10-18 VITALS — BP 118/80 | HR 93 | Temp 98.3°F | Ht 75.0 in | Wt 260.0 lb

## 2018-10-18 DIAGNOSIS — R6889 Other general symptoms and signs: Secondary | ICD-10-CM

## 2018-10-18 DIAGNOSIS — Z20822 Contact with and (suspected) exposure to covid-19: Secondary | ICD-10-CM

## 2018-10-18 DIAGNOSIS — E782 Mixed hyperlipidemia: Secondary | ICD-10-CM

## 2018-10-18 DIAGNOSIS — R350 Frequency of micturition: Secondary | ICD-10-CM

## 2018-10-18 DIAGNOSIS — R7989 Other specified abnormal findings of blood chemistry: Secondary | ICD-10-CM | POA: Diagnosis not present

## 2018-10-18 DIAGNOSIS — R739 Hyperglycemia, unspecified: Secondary | ICD-10-CM | POA: Diagnosis not present

## 2018-10-18 NOTE — Progress Notes (Signed)
Careteam: Patient Care Team: Lauree Chandler, NP as PCP - General (Geriatric Medicine) Suella Broad, MD as Consulting Physician (Physical Medicine and Rehabilitation) Berle Mull, MD as Consulting Physician (Sports Medicine)  Advanced Directive information Does Patient Have a Medical Advance Directive?: No, Would patient like information on creating a medical advance directive?: Yes (MAU/Ambulatory/Procedural Areas - Information given)  No Known Allergies  Chief Complaint  Patient presents with  . Medical Management of Chronic Issues    10 month follow-up, right side back pain and fasting if labs due   . Immunizations    Discuss need for PNA, refused flu vaccine   . Quality Metric Gaps    MALB, Foot Exam due, and eye exam   . Best Practice Recommendations    Discuss need for HIV screening      HPI: Patient is a 48 y.o. male seen in the office today for routine follow up.  In April had increase in fatigue and dry cough, body aches, lasted about 10 days. Something that he had never felt before. Children were sick too with fevers.   Chronic back pain- uses tramadol three times daily- occasional exercise.   Hyperlipidemia- continues on lipitor 20 mg daily, not on diet modification. Takes 4-5 times a week (will forget)  Hypogonadism- continues on testosterone   Hyperglycemia- more active since back at work,  Theatre manager more "junk" at work. No official diabetic diagnosis.   Review of Systems:  Review of Systems  Constitutional: Negative for chills, fever and weight loss.  HENT: Negative for tinnitus.   Respiratory: Negative for cough, sputum production and shortness of breath.   Cardiovascular: Negative for chest pain, palpitations and leg swelling.  Gastrointestinal: Negative for abdominal pain, constipation, diarrhea and heartburn.  Genitourinary: Negative for dysuria, frequency and urgency.  Musculoskeletal: Positive for back pain. Negative for falls, joint pain and  myalgias.  Skin: Negative.   Neurological: Negative for dizziness and headaches.  Psychiatric/Behavioral: Negative for depression and memory loss. The patient does not have insomnia.     Past Medical History:  Diagnosis Date  . Anxiety   . Arthritis   . Depression   . Diabetes mellitus without complication ([place])   . GERD (gastroesophageal reflux disease)   . Heartburn   . Hematuria, unspecified   . Lumbago   . Other abnormal blood chemistry   . Other malaise and fatigue   . Palpitations   . Unspecified vitamin D deficiency    Past Surgical History:  Procedure Laterality Date  . SHOULDER ARTHROSCOPY WITH ROTATOR CUFF REPAIR AND SUBACROMIAL DECOMPRESSION Right 12/22/2016   Procedure: Right shoulder arthroscopy, subacromial decompression, mini open rotator cuff repair;  Surgeon: Susa Day, MD;  Location: [facility];  Service: Orthopedics;  Laterality: Right;  120 mins   Social History:   reports that he has quit smoking. His smoking use included cigarettes. He started smoking about 18 months ago. He has a 15.00 pack-year smoking history. He has never used smokeless tobacco. He reports current alcohol use. He reports that he does not use drugs.  History reviewed. No pertinent family history.  Medications: Patient's Medications  New Prescriptions   No medications on file  Previous Medications   ACETAMINOPHEN (TYLENOL) 500 MG TABLET    Take 500 mg by mouth as needed.   ATORVASTATIN (LIPITOR) 20 MG TABLET    TAKE 1 TABLET BY MOUTH EVERY DAY   L-ARGININE 500 MG TABS    2 by mouth daily   MULTIPLE VITAMINS-MINERALS (  MULTIVITAMIN PO)    Take 1 tablet by mouth daily.   TESTOSTERONE 30 MG/ACT SOLN    Apply one pump under the arm once daily   TRAMADOL (ULTRAM) 50 MG TABLET    TAKE 1 TABLET BY MOUTH THREE TIMES A DAY AS NEEDED FOR PAIN  Modified Medications   No medications on file  Discontinued Medications   ACCU-CHEK FASTCLIX LANCETS MISC    Use as directed to check blood sugar  daily. Dx: E11.40   BLOOD GLUCOSE MONITORING SUPPL (ACCU-CHEK AVIVA PLUS) W/DEVICE KIT    Use to check blood sugar daily. Dx: E11.40   GLUCOSE BLOOD (ACCU-CHEK AVIVA PLUS) TEST STRIP    Use to check blood sugar daily. Dx: E11.40   LANCETS MISC. (ACCU-CHEK FASTCLIX LANCET) KIT    Use as directed to check blood sugar daily. Dx: E11.40    Physical Exam:  Vitals:   10/18/18 0931  BP: 118/80  Pulse: 93  Temp: 98.3 F (36.8 C)  TempSrc: Oral  SpO2: 95%  Weight: 260 lb (117.9 kg)  Height: '6\' 3"'  (1.905 m)   Body mass index is 32.5 kg/m. Wt Readings from Last 3 Encounters:  10/18/18 260 lb (117.9 kg)  12/11/17 269 lb 12.8 oz (122.4 kg)  08/29/17 273 lb (123.8 kg)    Physical Exam Constitutional:      Appearance: He is well-developed.  HENT:     Head: Normocephalic and atraumatic.  Eyes:     Pupils: Pupils are equal, round, and reactive to light.  Neck:     Musculoskeletal: Normal range of motion and neck supple.  Cardiovascular:     Rate and Rhythm: Normal rate and regular rhythm.     Heart sounds: Normal heart sounds.  Pulmonary:     Effort: Pulmonary effort is normal.     Breath sounds: Normal breath sounds.  Abdominal:     General: Bowel sounds are normal.     Palpations: Abdomen is soft.  Musculoskeletal:     Thoracic back: He exhibits tenderness.       Back:  Skin:    General: Skin is warm and dry.  Neurological:     Mental Status: He is alert and oriented to person, place, and time.  Psychiatric:        Behavior: Behavior normal.        Thought Content: Thought content normal.     Labs reviewed: Basic Metabolic Panel: Recent Labs    12/13/17 0834  NA 138  K 4.6  CL 101  CO2 29  GLUCOSE 102*  BUN 14  CREATININE 0.82  CALCIUM 9.8   Liver Function Tests: Recent Labs    12/13/17 0834  AST 19  ALT 22  BILITOT 0.6  PROT 7.2   No results for input(s): LIPASE, AMYLASE in the last 8760 hours. No results for input(s): AMMONIA in the last 8760  hours. CBC: No results for input(s): WBC, NEUTROABS, HGB, HCT, MCV, PLT in the last 8760 hours. Lipid Panel: Recent Labs    12/13/17 0834  CHOL 197  HDL 40*  LDLCALC 126*  TRIG 191*  CHOLHDL 4.9   TSH: No results for input(s): TSH in the last 8760 hours. A1C: Lab Results  Component Value Date   HGBA1C 5.4 08/29/2017     Assessment/Plan 1. Suspected Covid-19 Virus Infection -he had illness back in April, question COVID, will see if antibodies detected  - SAR CoV2 Serology (COVID 19)AB(IGG)IA  2. Low testosterone -continues on supplement - Testosterone  3. Mixed hyperlipidemia -encouraged dietary compliance with lipitor 20 mg daily  - CMP with eGFR([facility]) - Lipid panel  4. Hyperglycemia -diet controlled, never diagnosised with diabetes  - CBC with Differential/Platelet - Hemoglobin A1c  5. Urinary frequency With decrease flow and frequency- drinks a lot of water- will follow up PSA today. - PSA  Next appt: 6 months follow up  [place]. [place], [facility] [phone]

## 2018-10-19 LAB — CBC WITH DIFFERENTIAL/PLATELET
Absolute Monocytes: 645 cells/uL (ref 200–950)
Basophils Absolute: 50 cells/uL (ref 0–200)
Basophils Relative: 0.4 %
Eosinophils Absolute: 149 cells/uL (ref 15–500)
Eosinophils Relative: 1.2 %
HCT: 52.2 % — ABNORMAL HIGH (ref 38.5–50.0)
Hemoglobin: 17.4 g/dL — ABNORMAL HIGH (ref 13.2–17.1)
Lymphs Abs: 1860 cells/uL (ref 850–3900)
MCH: 27.4 pg (ref 27.0–33.0)
MCHC: 33.3 g/dL (ref 32.0–36.0)
MCV: 82.1 fL (ref 80.0–100.0)
MPV: 9.3 fL (ref 7.5–12.5)
Monocytes Relative: 5.2 %
Neutro Abs: 9697 cells/uL — ABNORMAL HIGH (ref 1500–7800)
Neutrophils Relative %: 78.2 %
Platelets: 318 10*3/uL (ref 140–400)
RBC: 6.36 10*6/uL — ABNORMAL HIGH (ref 4.20–5.80)
RDW: 14 % (ref 11.0–15.0)
Total Lymphocyte: 15 %
WBC: 12.4 10*3/uL — ABNORMAL HIGH (ref 3.8–10.8)

## 2018-10-19 LAB — LIPID PANEL
Cholesterol: 153 mg/dL (ref <200)
HDL: 44 mg/dL (ref >=40)
LDL Cholesterol (Calc): 88 mg/dL (calc)
Non-HDL Cholesterol (Calc): 109 mg/dL (calc) (ref <130)
Total CHOL/HDL Ratio: 3.5 (calc) (ref <5.0)
Triglycerides: 118 mg/dL (ref <150)

## 2018-10-19 LAB — COMPLETE METABOLIC PANEL WITH GFR
AG Ratio: 1.6 (calc) (ref 1.0–2.5)
ALT: 33 U/L (ref 9–46)
AST: 30 U/L (ref 10–40)
Albumin: 4.5 g/dL (ref 3.6–5.1)
Alkaline phosphatase (APISO): 92 U/L (ref 36–130)
BUN: 15 mg/dL (ref 7–25)
CO2: 28 mmol/L (ref 20–32)
Calcium: 10.5 mg/dL — ABNORMAL HIGH (ref 8.6–10.3)
Chloride: 102 mmol/L (ref 98–110)
Creat: 0.93 mg/dL (ref 0.60–1.35)
GFR, Est African American: 112 mL/min/{1.73_m2} (ref >=60)
GFR, Est Non African American: 97 mL/min/{1.73_m2} (ref >=60)
Globulin: 2.8 g/dL (calc) (ref 1.9–3.7)
Glucose, Bld: 102 mg/dL — ABNORMAL HIGH (ref 65–99)
Potassium: 4.2 mmol/L (ref 3.5–5.3)
Sodium: 138 mmol/L (ref 135–146)
Total Bilirubin: 0.5 mg/dL (ref 0.2–1.2)
Total Protein: 7.3 g/dL (ref 6.1–8.1)

## 2018-10-19 LAB — TESTOSTERONE: Testosterone: 1185 ng/dL — ABNORMAL HIGH (ref 250–827)

## 2018-10-19 LAB — PSA: PSA: 0.9 ng/mL (ref <=4.0)

## 2018-10-19 LAB — HEMOGLOBIN A1C
Hgb A1c MFr Bld: 5.3 % of total Hgb (ref <5.7)
Mean Plasma Glucose: 105 (calc)
eAG (mmol/L): 5.8 (calc)

## 2018-10-19 LAB — SAR COV2 SEROLOGY (COVID19)AB(IGG),IA: SARS CoV2 AB IGG: NEGATIVE

## 2018-10-25 ENCOUNTER — Other Ambulatory Visit: Payer: Self-pay

## 2018-10-25 ENCOUNTER — Other Ambulatory Visit: Payer: BC Managed Care – PPO

## 2018-10-25 ENCOUNTER — Other Ambulatory Visit: Payer: Self-pay | Admitting: Nurse Practitioner

## 2018-10-25 DIAGNOSIS — R7989 Other specified abnormal findings of blood chemistry: Secondary | ICD-10-CM

## 2018-10-25 LAB — CBC WITH DIFFERENTIAL/PLATELET
Absolute Monocytes: 468 cells/uL (ref 200–950)
Basophils Absolute: 39 cells/uL (ref 0–200)
Basophils Relative: 0.5 %
Eosinophils Absolute: 172 cells/uL (ref 15–500)
Eosinophils Relative: 2.2 %
HCT: 50.8 % — ABNORMAL HIGH (ref 38.5–50.0)
Hemoglobin: 16.6 g/dL (ref 13.2–17.1)
Lymphs Abs: 1638 cells/uL (ref 850–3900)
MCH: 26.2 pg — ABNORMAL LOW (ref 27.0–33.0)
MCHC: 32.7 g/dL (ref 32.0–36.0)
MCV: 80.1 fL (ref 80.0–100.0)
MPV: 9.1 fL (ref 7.5–12.5)
Monocytes Relative: 6 %
Neutro Abs: 5483 cells/uL (ref 1500–7800)
Neutrophils Relative %: 70.3 %
Platelets: 334 10*3/uL (ref 140–400)
RBC: 6.34 10*6/uL — ABNORMAL HIGH (ref 4.20–5.80)
RDW: 12.6 % (ref 11.0–15.0)
Total Lymphocyte: 21 %
WBC: 7.8 10*3/uL (ref 3.8–10.8)

## 2018-10-30 ENCOUNTER — Other Ambulatory Visit: Payer: Self-pay | Admitting: *Deleted

## 2018-10-30 MED ORDER — TRAMADOL HCL 50 MG PO TABS: ORAL_TABLET | ORAL | 0 refills | Status: DC

## 2018-10-30 NOTE — Telephone Encounter (Signed)
Patient requested refill Epic LR: 10/03/2018 Pended Rx and sent to [facility] for approval.  Janett Billow out of office.)

## 2018-11-17 ENCOUNTER — Other Ambulatory Visit: Payer: Self-pay | Admitting: Nurse Practitioner

## 2018-11-17 DIAGNOSIS — R7989 Other specified abnormal findings of blood chemistry: Secondary | ICD-10-CM

## 2018-11-19 NOTE — Telephone Encounter (Signed)
Verified in [place] database last refill 10/03/18 90 with no refills. Last appointment with provider 10/18/18 and next appointment is 04/18/19. Routing to provider for approval.

## 2018-11-27 ENCOUNTER — Other Ambulatory Visit: Payer: Self-pay | Admitting: Family

## 2018-11-27 ENCOUNTER — Other Ambulatory Visit: Payer: Self-pay | Admitting: *Deleted

## 2018-11-27 NOTE — Telephone Encounter (Signed)
Patient requested refill Epic LR: 10/30/2018 Pended Rx and sent to [facility] for approval.

## 2018-11-27 NOTE — Telephone Encounter (Signed)
Patient requesting refill for Tramadol. Verified in [place] data base last refill was 10/31/2018 for 90 tabs (30 day supply). Patient's last appointment was 10/18/18 and next appointment is 04/18/2019. Routing to provider for approval.

## 2018-11-28 MED ORDER — TRAMADOL HCL 50 MG PO TABS: ORAL_TABLET | ORAL | 0 refills | Status: DC

## 2018-11-28 NOTE — Telephone Encounter (Signed)
Patient called requesting his refill for Tramadol

## 2018-11-28 NOTE — Telephone Encounter (Signed)
Please remind him it is not due until 10/9

## 2018-12-27 ENCOUNTER — Other Ambulatory Visit: Payer: Self-pay | Admitting: *Deleted

## 2018-12-27 ENCOUNTER — Other Ambulatory Visit: Payer: Self-pay | Admitting: Nurse Practitioner

## 2018-12-27 MED ORDER — ATORVASTATIN CALCIUM 20 MG PO TABS: 20.0000 mg | ORAL_TABLET | Freq: Every day | ORAL | 1 refills | Status: DC

## 2018-12-27 NOTE — Telephone Encounter (Signed)
Patient requested refill Epic LR: 11/28/18 Pended Rx and sent to [facility] for approval.

## 2018-12-28 MED ORDER — TRAMADOL HCL 50 MG PO TABS: ORAL_TABLET | ORAL | 0 refills | Status: DC

## 2019-01-24 ENCOUNTER — Other Ambulatory Visit: Payer: Self-pay | Admitting: *Deleted

## 2019-01-24 DIAGNOSIS — R7989 Other specified abnormal findings of blood chemistry: Secondary | ICD-10-CM

## 2019-01-24 NOTE — Telephone Encounter (Signed)
Patient requested refill [place] Verified LR: 12/28/2018-Tramadol LR: 11/19/2018-Testosterone Pended Rx and sent to [facility] for approval.

## 2019-01-24 NOTE — Telephone Encounter (Signed)
Pt needs an appt to update pain management contract within the next 30 days please make appt before refill

## 2019-01-25 ENCOUNTER — Other Ambulatory Visit: Payer: Self-pay | Admitting: *Deleted

## 2019-01-25 MED ORDER — TRAMADOL HCL 50 MG PO TABS: ORAL_TABLET | ORAL | 0 refills | Status: DC

## 2019-01-25 MED ORDER — TESTOSTERONE 30 MG/ACT TD SOLN: TRANSDERMAL | 0 refills | Status: DC

## 2019-01-25 NOTE — Telephone Encounter (Signed)
Patient scheduled appointment for 12/30. Stated that he could not schedule before Christmas due to working with [place].

## 2019-02-14 ENCOUNTER — Other Ambulatory Visit: Payer: Self-pay

## 2019-02-14 ENCOUNTER — Encounter (HOSPITAL_COMMUNITY): Payer: Self-pay

## 2019-02-14 ENCOUNTER — Inpatient Hospital Stay (HOSPITAL_COMMUNITY)
Admission: EM | Admit: 2019-02-14 | Discharge: 2019-02-20 | DRG: 440 | Disposition: A | Discharge status: Home / Self Care | Payer: BC Managed Care – PPO | Attending: Internal Medicine | Admitting: Internal Medicine

## 2019-02-14 DIAGNOSIS — E785 Hyperlipidemia, unspecified: Secondary | ICD-10-CM | POA: Diagnosis present

## 2019-02-14 DIAGNOSIS — K859 Acute pancreatitis without necrosis or infection, unspecified: Secondary | ICD-10-CM | POA: Diagnosis present

## 2019-02-14 DIAGNOSIS — R748 Abnormal levels of other serum enzymes: Secondary | ICD-10-CM | POA: Diagnosis present

## 2019-02-14 DIAGNOSIS — Z20828 Contact with and (suspected) exposure to other viral communicable diseases: Secondary | ICD-10-CM | POA: Diagnosis present

## 2019-02-14 DIAGNOSIS — E119 Type 2 diabetes mellitus without complications: Secondary | ICD-10-CM | POA: Diagnosis present

## 2019-02-14 DIAGNOSIS — Z87891 Personal history of nicotine dependence: Secondary | ICD-10-CM

## 2019-02-14 DIAGNOSIS — R7989 Other specified abnormal findings of blood chemistry: Secondary | ICD-10-CM | POA: Diagnosis present

## 2019-02-14 DIAGNOSIS — K858 Other acute pancreatitis without necrosis or infection: Secondary | ICD-10-CM | POA: Diagnosis not present

## 2019-02-14 DIAGNOSIS — K219 Gastro-esophageal reflux disease without esophagitis: Secondary | ICD-10-CM | POA: Diagnosis present

## 2019-02-14 LAB — COMPREHENSIVE METABOLIC PANEL
ALT: 32 U/L (ref 0–44)
AST: 30 U/L (ref 15–41)
Albumin: 4.4 g/dL (ref 3.5–5.0)
Alkaline Phosphatase: 94 U/L (ref 38–126)
Anion gap: 11 (ref 5–15)
BUN: 20 mg/dL (ref 6–20)
CO2: 27 mmol/L (ref 22–32)
Calcium: 9.6 mg/dL (ref 8.9–10.3)
Chloride: 101 mmol/L (ref 98–111)
Creatinine, Ser: 1 mg/dL (ref 0.61–1.24)
GFR calc Af Amer: >60 mL/min (ref >60)
GFR calc non Af Amer: >60 mL/min (ref >60)
Glucose, Bld: 137 mg/dL — ABNORMAL HIGH (ref 70–99)
Potassium: 3.9 mmol/L (ref 3.5–5.1)
Sodium: 139 mmol/L (ref 135–145)
Total Bilirubin: 0.7 mg/dL (ref 0.3–1.2)
Total Protein: 7.7 g/dL (ref 6.5–8.1)

## 2019-02-14 LAB — CBC
HCT: 48.4 % (ref 39.0–52.0)
Hemoglobin: 16 g/dL (ref 13.0–17.0)
MCH: 27.8 pg (ref 26.0–34.0)
MCHC: 33.1 g/dL (ref 30.0–36.0)
MCV: 84 fL (ref 80.0–100.0)
Platelets: 253 10*3/uL (ref 150–400)
RBC: 5.76 MIL/uL (ref 4.22–5.81)
RDW: 13.3 % (ref 11.5–15.5)
WBC: 19.4 10*3/uL — ABNORMAL HIGH (ref 4.0–10.5)
nRBC: 0 % (ref 0.0–0.2)

## 2019-02-14 MED ORDER — SODIUM CHLORIDE 0.9% FLUSH: 3.0000 mL | Freq: Once | INTRAVENOUS | Status: DC

## 2019-02-14 MED ORDER — ONDANSETRON HCL 4 MG/2ML IJ SOLN
4.0000 mg | Freq: Once | INTRAMUSCULAR | Status: AC
  Administered 2019-02-14: 4 mg via INTRAVENOUS
  Filled 2019-02-14: qty 2

## 2019-02-14 MED ORDER — HYDROMORPHONE HCL 1 MG/ML IJ SOLN
1.0000 mg | Freq: Once | INTRAMUSCULAR | Status: AC
  Administered 2019-02-14: 1 mg via INTRAVENOUS
  Filled 2019-02-14: qty 1

## 2019-02-14 NOTE — ED Provider Notes (Signed)
[facility]-EMERGENCY DEPT Provider Note   CSN: [id number] Arrival date & time: 02/14/19  2029     History Chief Complaint  Patient presents with  . Abdominal Pain    Taylor Kidd is a 48 y.o. male.  Patient presents to the emergency department with a chief complaint of abdominal pain.  He states the pain started this morning.  Has been gradually worsening throughout the day.  The pain is in his right upper abdomen and left lower abdomen.  He does state that he feels some pain that radiates to his back.  He reports associated nausea and vomiting.  Denies any fevers.  Denies any prior abdominal surgeries.  Denies any overt blood in his vomit.  He rates his pain as severe.  The history is provided by the patient. No language interpreter was used.       Past Medical History:  Diagnosis Date  . Anxiety   . Arthritis   . Depression   . Diabetes mellitus without complication (HCC)   . GERD (gastroesophageal reflux disease)   . Heartburn   . Hematuria, unspecified   . Lumbago   . Other abnormal blood chemistry   . Other malaise and fatigue   . Palpitations   . Unspecified vitamin D deficiency     Patient Active Problem List   Diagnosis Date Noted  . Complete rotator cuff tear 12/22/2016  . Right shoulder pain 03/29/2016  . Hyperactive 03/29/2016  . Fatigue 06/30/2015  . Low testosterone 12/03/2014  . Anxiety state 04/08/2014  . Tobacco use disorder 04/08/2014  . Vitamin D deficiency 06/26/2013  . Sciatica 06/26/2013  . Internal hemorrhoids without mention of complication 06/26/2013  . Hyperlipidemia 06/26/2013  . Chronic pain syndrome 09/18/2009  . GERD 09/18/2009  . DISC DISEASE, LUMBAR 09/18/2009  . LOW BACK PAIN, CHRONIC 09/18/2009  . ROTATOR CUFF SPRAIN AND STRAIN 09/18/2009    Past Surgical History:  Procedure Laterality Date  . SHOULDER ARTHROSCOPY WITH ROTATOR CUFF REPAIR AND SUBACROMIAL DECOMPRESSION Right 12/22/2016   Procedure:  Right shoulder arthroscopy, subacromial decompression, mini open rotator cuff repair;  Surgeon: Jene EveryBeane, Jeffrey, MD;  Location: [facility];  Service: Orthopedics;  Laterality: Right;  120 mins       No family history on file.  Social History   Tobacco Use  . Smoking status: Former Smoker    Packs/day: 0.50    Years: 30.00    Pack years: 15.00    Types: Cigarettes    Start date: 03/23/2017  . Smokeless tobacco: Never Used  Substance Use Topics  . Alcohol use: Yes    Alcohol/week: 0.0 standard drinks    Comment: socially/moderation  . Drug use: No    Home Medications Prior to Admission medications   Medication Sig Start Date End Date Taking? Authorizing Provider  acetaminophen (TYLENOL) 500 MG tablet Take 500 mg by mouth as needed.   Yes [provider]  atorvastatin (LIPITOR) 20 MG tablet Take 1 tablet (20 mg total) by mouth daily. 12/27/18  Yes Sharon SellerEubanks, Jessica K, NP  L-Arginine 500 MG TABS 2 by mouth daily   Yes [provider]  Multiple Vitamins-Minerals (MULTIVITAMIN PO) Take 1 tablet by mouth daily.   Yes [provider]  Testosterone 30 MG/ACT SOLN Apply one pump under the arm once daily 01/25/19  Yes Sharon SellerEubanks, Jessica K, NP  traMADol Janean Sark(ULTRAM) 50 MG tablet Take one tablet by mouth three times daily as needed for pain 01/25/19  Yes Sharon SellerEubanks, Jessica K,  NP    Allergies    Patient has no known allergies.  Review of Systems   Review of Systems  All other systems reviewed and are negative.   Physical Exam Updated Vital Signs BP (!) 161/104 (BP Location: Left Arm)   Pulse 88   Temp 98.6 F (37 C) (Oral)   Resp 15   Ht 6\' 3"  (1.905 m)   Wt 115.7 kg   SpO2 98%   BMI 31.87 kg/m   Physical Exam Vitals and nursing note reviewed.  Constitutional:      Appearance: He is well-developed.  HENT:     Head: Normocephalic and atraumatic.  Eyes:     Conjunctiva/sclera: Conjunctivae normal.  Cardiovascular:     Rate and Rhythm: Normal rate and regular  rhythm.     Heart sounds: No murmur.  Pulmonary:     Effort: Pulmonary effort is normal. No respiratory distress.     Breath sounds: Normal breath sounds.  Abdominal:     Palpations: Abdomen is soft.     Tenderness: There is abdominal tenderness.     Comments: RUQ pain, +Murphy LLQ TTP  Musculoskeletal:     Cervical back: Neck supple.  Skin:    General: Skin is warm and dry.  Neurological:     Mental Status: He is alert and oriented to person, place, and time.  Psychiatric:        Mood and Affect: Mood normal.        Behavior: Behavior normal.     ED Results / Procedures / Treatments   Labs (all labs ordered are listed, but only abnormal results are displayed) Labs Reviewed  CBC - Abnormal; Notable for the following components:      Result Value   WBC 19.4 (*)    All other components within normal limits  LIPASE, BLOOD  COMPREHENSIVE METABOLIC PANEL  URINALYSIS, ROUTINE W REFLEX MICROSCOPIC    EKG None  Radiology No results found.  Procedures Procedures (including critical care time)  Medications Ordered in ED Medications  sodium chloride flush (NS) 0.9 % injection 3 mL (0 mLs Intravenous Hold 02/14/19 2056)  HYDROmorphone (DILAUDID) injection 1 mg (1 mg Intravenous Given 02/14/19 2231)  ondansetron (ZOFRAN) injection 4 mg (4 mg Intravenous Given 02/14/19 2231)    ED Course  I have reviewed the triage vital signs and the nursing notes.  Pertinent labs & imaging results that were available during my care of the patient were reviewed by me and considered in my medical decision making (see chart for details).    MDM Rules/Calculators/A&P                      Patient with significant abdominal pain.  Located in the right upper quadrant and left lower quadrant.  I do have concern for gallbladder disease, but the tenderness in his left lower quadrant is also concerning for other etiologies.  We will start with CT, but may need to also get right upper quadrant  ultrasound if CT is unremarkable.  Will treat patient's pain.  Will reassess.  CT shows pancreatitis.  Lipase is 2750.  No evidence of gallstones or gallbladder wall thickening.  Patient denies being a heavy drinker.  He states that he had 1 shot of vodka several days ago, but otherwise drinks once or twice a month.  I will add triglycerides.  Appreciate Dr. 2232 for admitting the patient.   Final Clinical Impression(s) / ED Diagnoses Final diagnoses:  Acute  pancreatitis, unspecified complication status, unspecified pancreatitis type    Rx / DC Orders ED Discharge Orders    None       Montine Circle, PA-C 02/15/19 0136    Davonna Belling, MD 02/18/19 0800

## 2019-02-14 NOTE — ED Triage Notes (Signed)
Pt coming from home c/o epigastric pain that started last night that has gotten worse throughout the day. Endorses n/v. States it hurts to breathing

## 2019-02-14 NOTE — ED Notes (Addendum)
Pt ambulated to the restroom with no assitance. Pt stated he feels nauseated

## 2019-02-14 NOTE — ED Notes (Signed)
Patient aware urine sample is needed. Urinal at bedside. 

## 2019-02-15 ENCOUNTER — Emergency Department (HOSPITAL_COMMUNITY): Payer: BC Managed Care – PPO

## 2019-02-15 ENCOUNTER — Encounter (HOSPITAL_COMMUNITY): Payer: Self-pay | Admitting: Internal Medicine

## 2019-02-15 DIAGNOSIS — E785 Hyperlipidemia, unspecified: Secondary | ICD-10-CM | POA: Diagnosis present

## 2019-02-15 DIAGNOSIS — K859 Acute pancreatitis without necrosis or infection, unspecified: Secondary | ICD-10-CM | POA: Diagnosis not present

## 2019-02-15 DIAGNOSIS — R748 Abnormal levels of other serum enzymes: Secondary | ICD-10-CM | POA: Diagnosis present

## 2019-02-15 DIAGNOSIS — E782 Mixed hyperlipidemia: Secondary | ICD-10-CM | POA: Diagnosis not present

## 2019-02-15 DIAGNOSIS — K219 Gastro-esophageal reflux disease without esophagitis: Secondary | ICD-10-CM | POA: Diagnosis not present

## 2019-02-15 DIAGNOSIS — K8500 Idiopathic acute pancreatitis without necrosis or infection: Secondary | ICD-10-CM | POA: Diagnosis not present

## 2019-02-15 DIAGNOSIS — Z20828 Contact with and (suspected) exposure to other viral communicable diseases: Secondary | ICD-10-CM | POA: Diagnosis present

## 2019-02-15 DIAGNOSIS — R7989 Other specified abnormal findings of blood chemistry: Secondary | ICD-10-CM | POA: Diagnosis not present

## 2019-02-15 DIAGNOSIS — K2100 Gastro-esophageal reflux disease with esophagitis, without bleeding: Secondary | ICD-10-CM | POA: Diagnosis not present

## 2019-02-15 DIAGNOSIS — K858 Other acute pancreatitis without necrosis or infection: Secondary | ICD-10-CM | POA: Diagnosis present

## 2019-02-15 DIAGNOSIS — E119 Type 2 diabetes mellitus without complications: Secondary | ICD-10-CM | POA: Diagnosis present

## 2019-02-15 DIAGNOSIS — Z87891 Personal history of nicotine dependence: Secondary | ICD-10-CM | POA: Diagnosis not present

## 2019-02-15 LAB — URINALYSIS, ROUTINE W REFLEX MICROSCOPIC
Bilirubin Urine: NEGATIVE
Glucose, UA: NEGATIVE mg/dL
Ketones, ur: 5 mg/dL — AB
Leukocytes,Ua: NEGATIVE
Nitrite: NEGATIVE
Protein, ur: NEGATIVE mg/dL
Specific Gravity, Urine: 1.02 (ref 1.005–1.030)
pH: 6 (ref 5.0–8.0)

## 2019-02-15 LAB — COMPREHENSIVE METABOLIC PANEL
ALT: 28 U/L (ref 0–44)
AST: 27 U/L (ref 15–41)
Albumin: 4.3 g/dL (ref 3.5–5.0)
Alkaline Phosphatase: 87 U/L (ref 38–126)
Anion gap: 11 (ref 5–15)
BUN: 19 mg/dL (ref 6–20)
CO2: 24 mmol/L (ref 22–32)
Calcium: 9.2 mg/dL (ref 8.9–10.3)
Chloride: 101 mmol/L (ref 98–111)
Creatinine, Ser: 0.67 mg/dL (ref 0.61–1.24)
GFR calc Af Amer: >60 mL/min (ref >60)
GFR calc non Af Amer: >60 mL/min (ref >60)
Glucose, Bld: 154 mg/dL — ABNORMAL HIGH (ref 70–99)
Potassium: 3.9 mmol/L (ref 3.5–5.1)
Sodium: 136 mmol/L (ref 135–145)
Total Bilirubin: 1 mg/dL (ref 0.3–1.2)
Total Protein: 7.9 g/dL (ref 6.5–8.1)

## 2019-02-15 LAB — LIPID PANEL
Cholesterol: 160 mg/dL (ref 0–200)
HDL: 43 mg/dL (ref >40)
LDL Cholesterol: 104 mg/dL — ABNORMAL HIGH (ref 0–99)
Total CHOL/HDL Ratio: 3.7 RATIO
Triglycerides: 67 mg/dL (ref <150)
VLDL: 13 mg/dL (ref 0–40)

## 2019-02-15 LAB — HIV ANTIBODY (ROUTINE TESTING W REFLEX): HIV Screen 4th Generation wRfx: NONREACTIVE

## 2019-02-15 LAB — CBC
HCT: 47.7 % (ref 39.0–52.0)
Hemoglobin: 15.4 g/dL (ref 13.0–17.0)
MCH: 27.1 pg (ref 26.0–34.0)
MCHC: 32.3 g/dL (ref 30.0–36.0)
MCV: 83.8 fL (ref 80.0–100.0)
Platelets: 215 10*3/uL (ref 150–400)
RBC: 5.69 MIL/uL (ref 4.22–5.81)
RDW: 13.4 % (ref 11.5–15.5)
WBC: 17.7 10*3/uL — ABNORMAL HIGH (ref 4.0–10.5)
nRBC: 0 % (ref 0.0–0.2)

## 2019-02-15 LAB — GLUCOSE, CAPILLARY
Glucose-Capillary: 114 mg/dL — ABNORMAL HIGH (ref 70–99)
Glucose-Capillary: 114 mg/dL — ABNORMAL HIGH (ref 70–99)
Glucose-Capillary: 118 mg/dL — ABNORMAL HIGH (ref 70–99)
Glucose-Capillary: 125 mg/dL — ABNORMAL HIGH (ref 70–99)
Glucose-Capillary: 138 mg/dL — ABNORMAL HIGH (ref 70–99)

## 2019-02-15 LAB — SARS CORONAVIRUS 2 (TAT 6-24 HRS): SARS Coronavirus 2: NEGATIVE

## 2019-02-15 LAB — TRIGLYCERIDES: Triglycerides: 84 mg/dL (ref <150)

## 2019-02-15 LAB — LIPASE, BLOOD
Lipase: 2750 U/L — ABNORMAL HIGH (ref 11–51)
Lipase: 955 U/L — ABNORMAL HIGH (ref 11–51)

## 2019-02-15 LAB — LACTATE DEHYDROGENASE: LDH: 149 U/L (ref 98–192)

## 2019-02-15 LAB — CBG MONITORING, ED: Glucose-Capillary: 146 mg/dL — ABNORMAL HIGH (ref 70–99)

## 2019-02-15 MED ORDER — LACTATED RINGERS IV SOLN: INTRAVENOUS | Status: DC

## 2019-02-15 MED ORDER — ACETAMINOPHEN 325 MG PO TABS
650.0000 mg | ORAL_TABLET | Freq: Four times a day (QID) | ORAL | Status: DC | PRN
  Administered 2019-02-15 – 2019-02-17 (×6): 650 mg via ORAL
  Filled 2019-02-15 (×6): qty 2

## 2019-02-15 MED ORDER — ACETAMINOPHEN 650 MG RE SUPP: 650.0000 mg | Freq: Four times a day (QID) | RECTAL | Status: DC | PRN

## 2019-02-15 MED ORDER — SODIUM CHLORIDE 0.9 % IV BOLUS
1000.0000 mL | Freq: Once | INTRAVENOUS | Status: AC
  Administered 2019-02-15: 1000 mL via INTRAVENOUS

## 2019-02-15 MED ORDER — ENOXAPARIN SODIUM 60 MG/0.6ML ~~LOC~~ SOLN
60.0000 mg | Freq: Every day | SUBCUTANEOUS | Status: DC
  Administered 2019-02-15 – 2019-02-20 (×5): 60 mg via SUBCUTANEOUS
  Filled 2019-02-15 (×7): qty 0.6

## 2019-02-15 MED ORDER — INSULIN ASPART 100 UNIT/ML ~~LOC~~ SOLN
0.0000 [IU] | SUBCUTANEOUS | Status: DC
  Administered 2019-02-15 – 2019-02-17 (×5): 1 [IU] via SUBCUTANEOUS
  Administered 2019-02-19: 2 [IU] via SUBCUTANEOUS
  Administered 2019-02-20 (×2): 1 [IU] via SUBCUTANEOUS
  Filled 2019-02-15: qty 0.09

## 2019-02-15 MED ORDER — IOHEXOL 300 MG/ML  SOLN
100.0000 mL | Freq: Once | INTRAMUSCULAR | Status: AC | PRN
  Administered 2019-02-15: 100 mL via INTRAVENOUS

## 2019-02-15 MED ORDER — MORPHINE SULFATE (PF) 4 MG/ML IV SOLN
4.0000 mg | Freq: Once | INTRAVENOUS | Status: AC
  Administered 2019-02-15: 4 mg via INTRAVENOUS
  Filled 2019-02-15: qty 1

## 2019-02-15 MED ORDER — SODIUM CHLORIDE 0.9 % IV SOLN: INTRAVENOUS | Status: DC

## 2019-02-15 MED ORDER — ONDANSETRON HCL 4 MG/2ML IJ SOLN
4.0000 mg | Freq: Four times a day (QID) | INTRAMUSCULAR | Status: DC | PRN
  Administered 2019-02-15 – 2019-02-19 (×7): 4 mg via INTRAVENOUS
  Filled 2019-02-15 (×7): qty 2

## 2019-02-15 MED ORDER — HYDROMORPHONE HCL 1 MG/ML IJ SOLN
1.0000 mg | INTRAMUSCULAR | Status: DC | PRN
  Administered 2019-02-15 – 2019-02-19 (×19): 1 mg via INTRAVENOUS
  Filled 2019-02-15 (×20): qty 1

## 2019-02-15 MED ORDER — HYDROMORPHONE HCL 1 MG/ML IJ SOLN
0.5000 mg | INTRAMUSCULAR | Status: DC | PRN
  Administered 2019-02-15 (×3): 0.5 mg via INTRAVENOUS
  Filled 2019-02-15: qty 0.5
  Filled 2019-02-15 (×2): qty 1

## 2019-02-15 NOTE — ED Notes (Signed)
Patient ambulated to restroom. Gait steady.

## 2019-02-15 NOTE — H&P (Signed)
TRH H&P    Patient Demographics:    Taylor Kidd, is a 48 y.o. male  MRN: [id number]  DOB - 14-Feb-1971  Admit Date - 02/14/2019  Referring MD/NP/PA:  Montine Circle  Outpatient Primary MD for the patient is Lauree Chandler, NP  Patient coming from:  Home   Chief complaint- abdominal pain, n/v   HPI:    Taylor Kidd  is a 48 y.o. male, w Dm2 (diet controlled0, hyperlipidemia, Jerrye Bushy apparently presents with epigastric pain starting last nite.  Pt  Noted etoh use 1 glass of vodka about 2 days ago.  Pt states drinks minimally.  Pt denies family hx of pancreatitis, or recent changes in medications.    In ED,  T 98.6, P 91, R 16, Bp 146/97  Pox 98% on RA Wt 115.7kg  CT abd pelvis IMPRESSION: 1. Findings most compatible with acute interstitial edematous pancreatitis in the setting of elevated lipase. Fairly uniform enhancement of the pancreatic parenchyma accounting for focal fatty infiltration of the pancreatic head seen on comparison study. Small amount of peripancreatic free fluid towards the pancreatic body and tail without organized collection. 2. Milder paraduodenal stranding is likely secondary to pancreatic inflammation in the absence of frank duodenal wall thickening  Na 139, K 3.9,  Bun 20, Creatinine 1.0 Ast 30, Alt 32, alk phos 94, T. Bili 0.7 Lipase 2,750 Tg 84  Pt will be admitted for acute pancreatitis  ? Secondary to L arginine      Review of systems:    In addition to the HPI above,  No Fever-chills, No Headache, No changes with Vision or hearing, No problems swallowing food or Liquids, No Chest pain, Cough or Shortness of Breath,  No Blood in stool or Urine, No dysuria, No new skin rashes or bruises, No new joints pains-aches,  No new weakness, tingling, numbness in any extremity, No recent weight gain or loss, No polyuria, polydypsia or polyphagia, No significant  Mental Stressors.  All other systems reviewed and are negative.    Past History of the following :    Past Medical History:  Diagnosis Date  . Anxiety   . Arthritis   . Depression   . Diabetes mellitus without complication ([place])   . GERD (gastroesophageal reflux disease)   . Heartburn   . Hematuria, unspecified   . Lumbago   . Other abnormal blood chemistry   . Other malaise and fatigue   . Palpitations   . Unspecified vitamin D deficiency       Past Surgical History:  Procedure Laterality Date  . SHOULDER ARTHROSCOPY WITH ROTATOR CUFF REPAIR AND SUBACROMIAL DECOMPRESSION Right 12/22/2016   Procedure: Right shoulder arthroscopy, subacromial decompression, mini open rotator cuff repair;  Surgeon: Susa Day, MD;  Location: [facility];  Service: Orthopedics;  Laterality: Right;  120 mins      Social History:      Social History   Tobacco Use  . Smoking status: Former Smoker    Packs/day: 0.50    Years: 30.00    Pack years:  15.00    Types: Cigarettes    Start date: 03/23/2017  . Smokeless tobacco: Never Used  Substance Use Topics  . Alcohol use: Yes    Alcohol/week: 0.0 standard drinks    Comment: socially/moderation       Family History :    History reviewed. No pertinent family history.  no family hx of pancreatitis   Home Medications:   Prior to Admission medications   Medication Sig Start Date End Date Taking? Authorizing Provider  acetaminophen (TYLENOL) 500 MG tablet Take 500 mg by mouth as needed.   Yes [provider]  atorvastatin (LIPITOR) 20 MG tablet Take 1 tablet (20 mg total) by mouth daily. 12/27/18  Yes Lauree Chandler, NP  L-Arginine 500 MG TABS 2 by mouth daily   Yes [provider]  Multiple Vitamins-Minerals (MULTIVITAMIN PO) Take 1 tablet by mouth daily.   Yes [provider]  Testosterone 30 MG/ACT SOLN Apply one pump under the arm once daily 01/25/19  Yes Lauree Chandler, NP  traMADol (ULTRAM) 50 MG  tablet Take one tablet by mouth three times daily as needed for pain 01/25/19  Yes Lauree Chandler, NP     Allergies:    No Known Allergies   Physical Exam:   Vitals  Blood pressure (!) 152/95, pulse 84, temperature 98.6 F (37 C), temperature source Oral, resp. rate 16, height '6\' 3"'  (1.905 m), weight 115.7 kg, SpO2 97 %.  1.  General: axoxo3  2. Psychiatric: euthymic  3. Neurologic: cn2-12 intact, reflexes 2+ symmetric, diffuse with no clonus, motor 5/5 in all 4 ext  4. HEENMT:  Anicteric, pupils 1.54m symmetric, direct, consensual, near intact Neck: no jvd  5. Respiratory : CTAB  6. Cardiovascular : rrr s1, s2,   7. Gastrointestinal:  Abd: soft, nt, nd, +bs  8. Skin:  Ext: no c/c/e, no rash  9.Musculoskeletal:  Good ROM,      Data Review:    CBC Recent Labs  Lab 02/14/19 2054  WBC 19.4*  HGB 16.0  HCT 48.4  PLT 253  MCV 84.0  MCH 27.8  MCHC 33.1  RDW 13.3   ------------------------------------------------------------------------------------------------------------------  Results for orders placed or performed during the hospital encounter of 02/14/19 (from the past 48 hour(s))  Lipase, blood     Status: Abnormal   Collection Time: 02/14/19  8:54 PM  Result Value Ref Range   Lipase 2,750 (H) 11 - 51 U/L    Comment: RESULTS CONFIRMED BY MANUAL DILUTION Performed at [facility] [place] [address]  Comprehensive metabolic panel     Status: Abnormal   Collection Time: 02/14/19  8:54 PM  Result Value Ref Range   Sodium 139 135 - 145 mmol/L   Potassium 3.9 3.5 - 5.1 mmol/L   Chloride 101 98 - 111 mmol/L   CO2 27 22 - 32 mmol/L   Glucose, Bld 137 (H) 70 - 99 mg/dL   BUN 20 6 - 20 mg/dL   Creatinine, Ser 1.00 0.61 - 1.24 mg/dL   Calcium 9.6 8.9 - 10.3 mg/dL   Total Protein 7.7 6.5 - 8.1 g/dL   Albumin 4.4 3.5 - 5.0 g/dL   AST 30 15 - 41 U/L   ALT 32 0 - 44 U/L   Alkaline Phosphatase 94 38 - 126  U/L   Total Bilirubin 0.7 0.3 - 1.2 mg/dL   GFR calc non Af Amer >60 >60 mL/min   GFR calc  Af Amer >60 >60 mL/min   Anion gap 11 5 - 15    Comment: Performed at [facility], [place] [address], [place], [place] [address]  CBC     Status: Abnormal   Collection Time: 02/14/19  8:54 PM  Result Value Ref Range   WBC 19.4 (H) 4.0 - 10.5 K/uL   RBC 5.76 4.22 - 5.81 MIL/uL   Hemoglobin 16.0 13.0 - 17.0 g/dL   HCT 48.4 39.0 - 52.0 %   MCV 84.0 80.0 - 100.0 fL   MCH 27.8 26.0 - 34.0 pg   MCHC 33.1 30.0 - 36.0 g/dL   RDW 13.3 11.5 - 15.5 %   Platelets 253 150 - 400 K/uL   nRBC 0.0 0.0 - 0.2 %    Comment: Performed at [facility], [place] [address], [place], [place] [address]  Urinalysis, Routine w reflex microscopic     Status: Abnormal   Collection Time: 02/14/19 11:46 PM  Result Value Ref Range   Color, Urine YELLOW YELLOW   APPearance CLEAR CLEAR   Specific Gravity, Urine 1.020 1.005 - 1.030   pH 6.0 5.0 - 8.0   Glucose, UA NEGATIVE NEGATIVE mg/dL   Hgb urine dipstick SMALL (A) NEGATIVE   Bilirubin Urine NEGATIVE NEGATIVE   Ketones, ur 5 (A) NEGATIVE mg/dL   Protein, ur NEGATIVE NEGATIVE mg/dL   Nitrite NEGATIVE NEGATIVE   Leukocytes,Ua NEGATIVE NEGATIVE   RBC / HPF 11-20 0 - 5 RBC/hpf   WBC, UA 0-5 0 - 5 WBC/hpf   Bacteria, UA RARE (A) NONE SEEN   Mucus PRESENT     Comment: Performed at [facility], [place] [address], [place], [place] [address]  Triglycerides     Status: None   Collection Time: 02/15/19  1:27 AM  Result Value Ref Range   Triglycerides 84 <150 mg/dL    Comment: Performed at [facility], [place] [address] [place], [place] [address]    Chemistries  Recent Labs  Lab 02/14/19 2054  NA 139  K 3.9  CL 101  CO2 27  GLUCOSE 137*  BUN 20  CREATININE 1.00  CALCIUM 9.6  AST 30  ALT 32  ALKPHOS 94  BILITOT 0.7    ------------------------------------------------------------------------------------------------------------------  ------------------------------------------------------------------------------------------------------------------ GFR: Estimated Creatinine Clearance: 123.9 mL/min (by C-G formula based on SCr of 1 mg/dL). Liver Function Tests: Recent Labs  Lab 02/14/19 2054  AST 30  ALT 32  ALKPHOS 94  BILITOT 0.7  PROT 7.7  ALBUMIN 4.4   Recent Labs  Lab 02/14/19 2054  LIPASE 2,750*   No results for input(s): AMMONIA in the last 168 hours. Coagulation Profile: No results for input(s): INR, PROTIME in the last 168 hours. Cardiac Enzymes: No results for input(s): CKTOTAL, CKMB, CKMBINDEX, TROPONINI in the last 168 hours. BNP (last 3 results) No results for input(s): PROBNP in the last 8760 hours. HbA1C: No results for input(s): HGBA1C in the last 72 hours. CBG: No results for input(s): GLUCAP in the last 168 hours. Lipid Profile: Recent Labs    02/15/19 0127  TRIG 84   Thyroid Function Tests: No results for input(s): TSH, T4TOTAL, FREET4, T3FREE, THYROIDAB in the last 72 hours. Anemia Panel: No results for input(s): VITAMINB12, FOLATE, FERRITIN, TIBC, IRON, RETICCTPCT in the last 72 hours.  --------------------------------------------------------------------------------------------------------------- Urine analysis:    Component Value Date/Time   COLORURINE YELLOW 02/14/2019 [place] 02/14/2019 2346   LABSPEC 1.020 02/14/2019 2346   PHURINE  6.0 02/14/2019 2346   GLUCOSEU NEGATIVE 02/14/2019 2346   HGBUR SMALL (A) 02/14/2019 2346   BILIRUBINUR NEGATIVE 02/14/2019 2346   KETONESUR 5 (A) 02/14/2019 2346   PROTEINUR NEGATIVE 02/14/2019 2346   NITRITE NEGATIVE 02/14/2019 2346   LEUKOCYTESUR NEGATIVE 02/14/2019 2346      Imaging Results:    CT ABDOMEN PELVIS W CONTRAST  Result Date: 02/15/2019 CLINICAL DATA:  One day of epigastric pain  with nausea vomiting, leukocytosis EXAM: CT ABDOMEN AND PELVIS WITH CONTRAST TECHNIQUE: Multidetector CT imaging of the abdomen and pelvis was performed using the standard protocol following bolus administration of intravenous contrast. CONTRAST:  157m OMNIPAQUE IOHEXOL 300 MG/ML  SOLN COMPARISON:  CT abdomen pelvis 08/30/2006 FINDINGS: Lower chest: There are dependent atelectatic changes in the lung bases. No consolidation or visible effusion. Normal heart size. No pericardial effusion. Hepatobiliary: No focal liver abnormality is seen. No gallstones, gallbladder wall thickening, or biliary dilatation. Pancreas: Diffusely edematous appearance of the partially fatty replaced pancreas. There is some reactive peripancreatic fluid stranding predominantly along the body and tail of the pancreas. Parenchyma enhances uniformly accounting for more focal fatty infiltration at the pancreatic head seen on comparison study. Spleen: Normal in size without focal abnormality. Adrenals/Urinary Tract: Adrenal glands are unremarkable. Kidneys are normal, without renal calculi, focal lesion, or hydronephrosis. Bladder is unremarkable. Stomach/Bowel: Distal esophagus and stomach are unremarkable. Mild periduodenal stranding is likely secondary to pancreatic inflammation in the absence of frank duodenal wall thickening. No small bowel dilatation or wall thickening. A normal appendix is visualized. No colonic dilatation or wall thickening. Vascular/Lymphatic: The aorta is normal caliber. Upper abdominal reactive adenopathy is seen. No suspicious lymph nodes in the included lymphatic chains. Reproductive: The prostate and seminal vesicles are unremarkable. Other: Edematous change and small volume of peripancreatic fluid about the pancreatic tail without organized collection. No other free fluid or free for air in the abdomen or pelvis. Small fat containing umbilical hernia. No bowel containing hernia. Musculoskeletal: Multilevel  degenerative changes are present in the imaged portions of the spine. No acute osseous abnormality or suspicious osseous lesion. IMPRESSION: 1. Findings most compatible with acute interstitial edematous pancreatitis in the setting of elevated lipase. Fairly uniform enhancement of the pancreatic parenchyma accounting for focal fatty infiltration of the pancreatic head seen on comparison study. Small amount of peripancreatic free fluid towards the pancreatic body and tail without organized collection. 2. Milder paraduodenal stranding is likely secondary to pancreatic inflammation in the absence of frank duodenal wall thickening These results were called by telephone at the time of interpretation on 02/15/2019 at 1:26 am to provider RMontine Circle, who verbally acknowledged these results. Electronically Signed   By: PLovena LeM.D.   On: 02/15/2019 01:26       Assessment & Plan:    Active Problems:   Acute pancreatitis  Acute pancreatitis ? Secondary to L arginine NPO Ns iv Check LDH, Lipid Check lipase in am STOP L arginine (been taking for a few months),  and Atorvastatin Dilaudid 185miv q3h prn   N/v Protonix 4054mv qday Zofran 4mg64m q6h prn   Hyperlipidemia Hold Lipitor  DVT Prophylaxis-   Lovenox - SCDs   AM Labs Ordered, also please review Full Orders  Family Communication: Admission, patients condition and plan of care including tests being ordered have been discussed with the patient who indicate understanding and agree with the plan and Code Status.  Code Status:  FULL CODE per patient,  Notified wife that  patient is admitted to [facility]  Admission status: Inpatient: Based on patients clinical presentation and evaluation of above clinical data, I have made determination that patient meets Inpatient criteria at this time.  Pt has pancreatitis and will require iv fluids and iv pain control.  Pt will require > 2 nites stay due to the marked lipase elevation.   Time spent in  minutes : 55 minutes   Jani Gravel M.D on 02/15/2019 at 2:43 AM

## 2019-02-15 NOTE — Progress Notes (Signed)
Please see H&P from this morning for full details.  48 year old male admitted with acute pancreatitis with lipase elevated and CT showing diffuse pancreatic inflammation.  States drinks alcohol moderately (12 pack beer lasts him 2 months).  He did have vodka with cranberry juice 2 days prior to admission.  Triglycerides okay.  No evidence of gallstones on CT.  Not on any thiazide diuretics.  Likely alcoholic pancreatitis.  His pain appears uncontrolled, increase Dilaudid dosage.  Changed IV fluids to lactated Ringer's and started on clear liquid diet to see if he can tolerate.  [place] team will follow up in a.m.

## 2019-02-16 DIAGNOSIS — E782 Mixed hyperlipidemia: Secondary | ICD-10-CM

## 2019-02-16 DIAGNOSIS — K219 Gastro-esophageal reflux disease without esophagitis: Secondary | ICD-10-CM

## 2019-02-16 DIAGNOSIS — K8500 Idiopathic acute pancreatitis without necrosis or infection: Secondary | ICD-10-CM

## 2019-02-16 LAB — GLUCOSE, CAPILLARY
Glucose-Capillary: 101 mg/dL — ABNORMAL HIGH (ref 70–99)
Glucose-Capillary: 104 mg/dL — ABNORMAL HIGH (ref 70–99)
Glucose-Capillary: 107 mg/dL — ABNORMAL HIGH (ref 70–99)
Glucose-Capillary: 114 mg/dL — ABNORMAL HIGH (ref 70–99)
Glucose-Capillary: 123 mg/dL — ABNORMAL HIGH (ref 70–99)
Glucose-Capillary: 91 mg/dL (ref 70–99)

## 2019-02-16 MED ORDER — HYDROMORPHONE HCL 1 MG/ML IJ SOLN
1.0000 mg | INTRAMUSCULAR | Status: AC | PRN
  Administered 2019-02-16: 1 mg via INTRAVENOUS

## 2019-02-16 NOTE — Progress Notes (Signed)
Yellow MEWS initated, pain uncontrolled all night, dilaudid given at 0300. On call notified.   MEWS/VS Documentation      02/15/2019 1900 02/15/2019 2041 02/16/2019 0409 02/16/2019 0410   MEWS Score:  1  1  2  1    MEWS Score Color:  Green  Green  Yellow  Green   Resp:  --  19  19  --   Pulse:  --  (!) 109  (!) 112  (!) 109   BP:  --  (!) 159/86  (!) 159/93  --   Temp:  --  99.6 F (37.6 C)  99.5 F (37.5 C)  --   O2 Device:  --  Room [employer]  --         Jorene Minors 02/16/2019,4:19 AM

## 2019-02-16 NOTE — Progress Notes (Addendum)
PROGRESS NOTE  Taylor Kidd GNF:[id number] DOB: [birth year] DOA: 02/14/2019 PCP: Lauree [place], NP  HPI/Recap of past 24 hours:  This is a 48 year old male that was admitted for pancreatitis  Subjective: Seen and examined at bedside is getting chips now he still has some abdominal pain but said is much better but still has some tenderness on palpation.  Noted nausea is improved.  Patient would like his diet advanced.  We will advance her to clear liquids  Assessment/Plan: Active Problems:   GERD   Hyperlipidemia   Low testosterone   Acute pancreatitis  #1 acute pancreatitis likely from L arginine use, improved continue bowel rest we will try clear liquids from dinner.  Continue to hold atorvastatin and arginine  2.  Alcohol use patient denies excessive alcohol use.  His last drink was a drink of vodka about 2 days prior to presentation  3.  Tachycardia might be due to dehydration continue IV hydration of normal saline at 150.  We will continue telemetry monitoring.  4.  Hyperlipidemia.  Lipitor is on hold due to his acute pancreatitis  5.  Nausea vomiting from acute pancreatitis continue as needed Zofran and Protonix IV daily  6.  Leukocytosis this might be reactive on the pancreatitis.  We will continue to follow CBC  7.  History of diabetes fairly well controlled  Code Status: Full  Severity of Illness: The appropriate patient status for this patient is INPATIENT. Inpatient status is judged to be reasonable and necessary in order to provide the required intensity of service to ensure the patient's safety. The patient's presenting symptoms, physical exam findings, and initial radiographic and laboratory data in the context of their chronic comorbidities is felt to place them at high risk for further clinical deterioration. Furthermore, it is not anticipated that the patient will be medically stable for discharge from the hospital within 2 midnights of admission. The  following factors support the patient status of inpatient.   " The patient's presenting symptoms include abdominal pain. " The worrisome physical exam findings include abdominal pain. " The initial radiographic and laboratory data are worrisome because of pancreatic edema. " The chronic co-morbidities include use of arginine.   * I certify that at the point of admission it is my clinical judgment that the patient will require inpatient hospital care spanning beyond 2 midnights from the point of admission due to high intensity of service, high risk for further deterioration and high frequency of surveillance required.*    Family Communication: Spoke with his wife over the phone his wife Lavella Lemons  Disposition Plan: Home when able to tolerate food   Consultants:  None  Procedures:  None  Antimicrobials:  None  DVT prophylaxis: Lovenox   Objective: Vitals:   02/16/19 0409 02/16/19 0410 02/16/19 0624 02/16/19 0855  BP: (!) 159/93  (!) 155/89 (!) 150/88  Pulse: (!) 112 (!) 109 (!) 109 (!) 105  Resp: 19  20 20   Temp: 99.5 F (37.5 C)  99.8 F (37.7 C) 99.6 F (37.6 C)  TempSrc: Oral  Oral Oral  SpO2: 92% 94% 95% 94%  Weight:  117.8 kg    Height:        Intake/Output Summary (Last 24 hours) at 02/16/2019 1029 Last data filed at 02/15/2019 1502 Gross per 24 hour  Intake 1853.33 ml  Output --  Net 1853.33 ml   Filed Weights   02/14/19 2037 02/16/19 0410  Weight: 115.7 kg 117.8 kg   Body mass index is  32.46 kg/m.  Exam:  . General: 48 y.o. year-old male well developed well nourished in no acute distress.  Alert and oriented x3. . Cardiovascular: Regular rate and rhythm with no rubs or gallops.  No thyromegaly or JVD noted.   Marland Kitchen. Respiratory: Clear to auscultation with no wheezes or rales. Good inspiratory effort. . Abdomen: Soft, tender to palpation especially in the epigastric upper quadrant area, nondistended with normal bowel sounds x4  quadrants. . Musculoskeletal: No lower extremity edema. 2/4 pulses in all 4 extremities. . Skin: No ulcerative lesions noted or rashes, . Psychiatry: Mood is appropriate for condition and setting    Data Reviewed: CBC: Recent Labs  Lab 02/14/19 2054 02/15/19 0500  WBC 19.4* 17.7*  HGB 16.0 15.4  HCT 48.4 47.7  MCV 84.0 83.8  PLT 253 215   Basic Metabolic Panel: Recent Labs  Lab 02/14/19 2054 02/15/19 0500  NA 139 136  K 3.9 3.9  CL 101 101  CO2 27 24  GLUCOSE 137* 154*  BUN 20 19  CREATININE 1.00 0.67  CALCIUM 9.6 9.2   GFR: Estimated Creatinine Clearance: 156.2 mL/min (by C-G formula based on SCr of 0.67 mg/dL). Liver Function Tests: Recent Labs  Lab 02/14/19 2054 02/15/19 0500  AST 30 27  ALT 32 28  ALKPHOS 94 87  BILITOT 0.7 1.0  PROT 7.7 7.9  ALBUMIN 4.4 4.3   Recent Labs  Lab 02/14/19 2054 02/15/19 0500  LIPASE 2,750* 955*   No results for input(s): AMMONIA in the last 168 hours. Coagulation Profile: No results for input(s): INR, PROTIME in the last 168 hours. Cardiac Enzymes: No results for input(s): CKTOTAL, CKMB, CKMBINDEX, TROPONINI in the last 168 hours. BNP (last 3 results) No results for input(s): PROBNP in the last 8760 hours. HbA1C: No results for input(s): HGBA1C in the last 72 hours. CBG: Recent Labs  Lab 02/15/19 1619 02/15/19 2044 02/15/19 2352 02/16/19 0413 02/16/19 0820  GLUCAP 114* 118* 114* 114* 101*   Lipid Profile: Recent Labs    02/15/19 0127 02/15/19 0500  CHOL  --  160  HDL  --  43  LDLCALC  --  161104*  TRIG 84 67  CHOLHDL  --  3.7   Thyroid Function Tests: No results for input(s): TSH, T4TOTAL, FREET4, T3FREE, THYROIDAB in the last 72 hours. Anemia Panel: No results for input(s): VITAMINB12, FOLATE, FERRITIN, TIBC, IRON, RETICCTPCT in the last 72 hours. Urine analysis:    Component Value Date/Time   COLORURINE YELLOW 02/14/2019 2346   APPEARANCEUR CLEAR 02/14/2019 2346   LABSPEC 1.020 02/14/2019 2346    PHURINE 6.0 02/14/2019 2346   GLUCOSEU NEGATIVE 02/14/2019 2346   HGBUR SMALL (A) 02/14/2019 2346   BILIRUBINUR NEGATIVE 02/14/2019 2346   KETONESUR 5 (A) 02/14/2019 2346   PROTEINUR NEGATIVE 02/14/2019 2346   NITRITE NEGATIVE 02/14/2019 2346   LEUKOCYTESUR NEGATIVE 02/14/2019 2346   Sepsis Labs: @LABRCNTIP (procalcitonin:4,lacticidven:4)  ) Recent Results (from the past 240 hour(s))  SARS CORONAVIRUS 2 (TAT 6-24 HRS) Nasopharyngeal Nasopharyngeal Swab     Status: None   Collection Time: 02/15/19  1:27 AM   Specimen: Nasopharyngeal Swab  Result Value Ref Range Status   SARS Coronavirus 2 NEGATIVE NEGATIVE Final    Comment: (NOTE) SARS-CoV-2 target nucleic acids are NOT DETECTED. The SARS-CoV-2 RNA is generally detectable in upper and lower respiratory specimens during the acute phase of infection. Negative results do not preclude SARS-CoV-2 infection, do not rule out co-infections with other pathogens, and should not be used as  the sole basis for treatment or other patient management decisions. Negative results must be combined with clinical observations, patient history, and epidemiological information. The expected result is Negative. Fact Sheet for Patients: HairSlick.no Fact Sheet for Healthcare Providers: quierodirigir.com This test is not yet approved or cleared by the [place] FDA and  has been authorized for detection and/or diagnosis of SARS-CoV-2 by FDA under an Emergency Use Authorization (EUA). This EUA will remain  in effect (meaning this test can be used) for the duration of the COVID-19 declaration under Section 56 4(b)(1) of the Act, 21 U.S.C. section 360bbb-3(b)(1), unless the authorization is terminated or revoked sooner. Performed at [facility], [address], [place], [place] [address]       Studies: No results found.  Scheduled Meds: . enoxaparin (LOVENOX) injection  60 mg  Subcutaneous Daily  . insulin aspart  0-9 Units Subcutaneous Q4H  . sodium chloride flush  3 mL Intravenous Once    Continuous Infusions: . lactated ringers 150 mL/hr at 02/16/19 0915     LOS: 1 day   Time spent 35 minutes  Myrtie Neither, MD [facility]  To reach me or the doctor on call, go to: [email or web address] Password [facility]  02/16/2019, 10:29 AM

## 2019-02-16 NOTE — Progress Notes (Signed)
   02/16/19 2055  MEWS Score  Resp 20  Pulse Rate (!) 110  BP (!) 148/88  Temp (!) 101.1 F (38.4 C)  SpO2 94 %  O2 Device Room Air  MEWS Score  MEWS RR 0  MEWS Pulse 1  MEWS Systolic 0  MEWS LOC 0  MEWS Temp 1  MEWS Score 2  MEWS Score Color Yellow  MEWS Assessment  Is this an acute change? Yes  MEWS guidelines implemented *See Row Information* Yellow  Rapid Response Notification  Name of Rapid Response RN Notified NA  Provider Notification  Provider Name/Title Bodnehiemer   Date Provider Notified 02/16/19  Time Provider Notified 2100  Notification Type Page  Notification Reason Change in status  PRN for temp. Will continue to monitor following yellow MEWS guidelines

## 2019-02-17 DIAGNOSIS — K2100 Gastro-esophageal reflux disease with esophagitis, without bleeding: Secondary | ICD-10-CM

## 2019-02-17 LAB — GLUCOSE, CAPILLARY
Glucose-Capillary: 102 mg/dL — ABNORMAL HIGH (ref 70–99)
Glucose-Capillary: 107 mg/dL — ABNORMAL HIGH (ref 70–99)
Glucose-Capillary: 108 mg/dL — ABNORMAL HIGH (ref 70–99)
Glucose-Capillary: 110 mg/dL — ABNORMAL HIGH (ref 70–99)
Glucose-Capillary: 112 mg/dL — ABNORMAL HIGH (ref 70–99)
Glucose-Capillary: 130 mg/dL — ABNORMAL HIGH (ref 70–99)

## 2019-02-17 MED ORDER — POLYETHYLENE GLYCOL 3350 17 G PO PACK
17.0000 g | PACK | Freq: Two times a day (BID) | ORAL | Status: DC
  Administered 2019-02-17 – 2019-02-19 (×4): 17 g via ORAL
  Filled 2019-02-17 (×5): qty 1

## 2019-02-17 MED ORDER — IBUPROFEN 200 MG PO TABS
600.0000 mg | ORAL_TABLET | Freq: Three times a day (TID) | ORAL | Status: DC | PRN
  Administered 2019-02-17 – 2019-02-19 (×5): 600 mg via ORAL
  Filled 2019-02-17 (×5): qty 3

## 2019-02-17 MED ORDER — PANTOPRAZOLE SODIUM 40 MG PO TBEC
40.0000 mg | DELAYED_RELEASE_TABLET | Freq: Every day | ORAL | Status: DC
  Administered 2019-02-17 – 2019-02-20 (×4): 40 mg via ORAL
  Filled 2019-02-17 (×4): qty 1

## 2019-02-17 NOTE — Progress Notes (Addendum)
PROGRESS NOTE  Taylor Kidd PZW:[id number] DOB: 05-20-1970 DOA: 02/14/2019 PCP: Sharon Seller, NP  HPI/Recap of past 24 hours:  This is a 48 year old male that was admitted for pancreatitis  Subjective: Seen and examined at bedside is getting chips now he still has some abdominal pain but said is much better but still has some tenderness on palpation.  Noted nausea is improved.  Patient would like his diet advanced.  We will advance her to clear liquids  February 17, 2019. Subjective: Patient seen and examined at bedside.  Complaining he has not had a bowel movement for 3 days also complaining of epigastric pain after eating he is currently on clear liquids.  We will start him on PPI and bowel regiment  Assessment/Plan: Active Problems:   GERD   Hyperlipidemia   Low testosterone   Acute pancreatitis  #1 acute pancreatitis likely from L arginine use, improved continue bowel rest we will continue clear liquids slowly advance we will keep on clear liquids for now.  Patient was started on PPI that might help with epigastric pain we will see how he does before increasing to full liquid.    Continue to hold atorvastatin and arginine  2.  Alcohol use patient denies excessive alcohol use his drink was a drink of vodka about 2 days prior to presentation  3.  Tachycardia might be due to dehydration continue IV hydration of normal saline at 150.  We will continue telemetry monitoring.  4.  Hyperlipidemia.  Lipitor is on hold due to his acute pancreatitis  5.  Nausea vomiting from acute pancreatitis continue as needed Zofran  IV daily  6.  Leukocytosis this might be reactive on the pancreatitis.  We will continue to follow CBC  7.  History of diabetes fairly well controlled  8.  Constipation we will start him on MiraLAX.  9.  Epigastric pain.  We will start him on PPI  Code Status: Full  Severity of Illness: The appropriate patient status for this patient is INPATIENT.  Inpatient status is judged to be reasonable and necessary in order to provide the required intensity of service to ensure the patient's safety. The patient's presenting symptoms, physical exam findings, and initial radiographic and laboratory data in the context of their chronic comorbidities is felt to place them at high risk for further clinical deterioration. Furthermore, it is not anticipated that the patient will be medically stable for discharge from the hospital within 2 midnights of admission. The following factors support the patient status of inpatient.   " The patient's presenting symptoms include abdominal pain. " The worrisome physical exam findings include abdominal pain. " The initial radiographic and laboratory data are worrisome because of pancreatic edema. " The chronic co-morbidities include use of arginine.   * I certify that at the point of admission it is my clinical judgment that the patient will require inpatient hospital care spanning beyond 2 midnights from the point of admission due to high intensity of service, high risk for further deterioration and high frequency of surveillance required.*    Family Communication: Spoke with his wife over the phone his wife [name]  Disposition Plan: Home when able to tolerate food   Consultants:  None  Procedures:  None  Antimicrobials:  None  DVT prophylaxis: Lovenox   Objective: Vitals:   02/16/19 2247 02/17/19 0055 02/17/19 0414 02/17/19 0911  BP: 135/83 (!) 138/97 (!) 143/91 (!) 145/100  Pulse: 100 (!) 103 (!) 103 (!) 102  Resp: 20  20 19 16   Temp: 99.5 F (37.5 C) 99.5 F (37.5 C) 100 F (37.8 C) 99.2 F (37.3 C)  TempSrc: Oral Oral Oral Oral  SpO2: 95% 97% 95% 97%  Weight:   117.8 kg   Height:        Intake/Output Summary (Last 24 hours) at 02/17/2019 0936 Last data filed at 02/17/2019 0355 Gross per 24 hour  Intake 2714.34 ml  Output --  Net 2714.34 ml   Filed Weights   02/14/19 2037 02/16/19  0410 02/17/19 0414  Weight: 115.7 kg 117.8 kg 117.8 kg   Body mass index is 32.46 kg/m.  Exam:  . General: 48 y.o. year-old male well developed well nourished in no acute distress.  Alert and oriented x3. . Cardiovascular: Regular rate and rhythm with no rubs or gallops.  No thyromegaly or JVD noted.   Marland Kitchen Respiratory: Clear to auscultation with no wheezes or rales. Good inspiratory effort. . Abdomen: Soft, tender to palpation especially in the epigastric upper quadrant area, mildly distended distended with normal bowel sounds x4 quadrants. . Musculoskeletal: No lower extremity edema. 2/4 pulses in all 4 extremities. . Skin: No ulcerative lesions noted or rashes, . Psychiatry: Mood is appropriate for condition and setting    Data Reviewed: CBC: Recent Labs  Lab 02/14/19 2054 02/15/19 0500  WBC 19.4* 17.7*  HGB 16.0 15.4  HCT 48.4 47.7  MCV 84.0 83.8  PLT 253 387   Basic Metabolic Panel: Recent Labs  Lab 02/14/19 2054 02/15/19 0500  NA 139 136  K 3.9 3.9  CL 101 101  CO2 27 24  GLUCOSE 137* 154*  BUN 20 19  CREATININE 1.00 0.67  CALCIUM 9.6 9.2   GFR: Estimated Creatinine Clearance: 156.2 mL/min (by C-G formula based on SCr of 0.67 mg/dL). Liver Function Tests: Recent Labs  Lab 02/14/19 2054 02/15/19 0500  AST 30 27  ALT 32 28  ALKPHOS 94 87  BILITOT 0.7 1.0  PROT 7.7 7.9  ALBUMIN 4.4 4.3   Recent Labs  Lab 02/14/19 2054 02/15/19 0500  LIPASE 2,750* 955*   No results for input(s): AMMONIA in the last 168 hours. Coagulation Profile: No results for input(s): INR, PROTIME in the last 168 hours. Cardiac Enzymes: No results for input(s): CKTOTAL, CKMB, CKMBINDEX, TROPONINI in the last 168 hours. BNP (last 3 results) No results for input(s): PROBNP in the last 8760 hours. HbA1C: No results for input(s): HGBA1C in the last 72 hours. CBG: Recent Labs  Lab 02/16/19 1710 02/16/19 2057 02/16/19 2345 02/17/19 0418 02/17/19 0754  GLUCAP 91 107* 123*  110* 107*   Lipid Profile: Recent Labs    02/15/19 0127 02/15/19 0500  CHOL  --  160  HDL  --  43  LDLCALC  --  104*  TRIG 84 67  CHOLHDL  --  3.7   Thyroid Function Tests: No results for input(s): TSH, T4TOTAL, FREET4, T3FREE, THYROIDAB in the last 72 hours. Anemia Panel: No results for input(s): VITAMINB12, FOLATE, FERRITIN, TIBC, IRON, RETICCTPCT in the last 72 hours. Urine analysis:    Component Value Date/Time   COLORURINE YELLOW 02/14/2019 2346   APPEARANCEUR CLEAR 02/14/2019 2346   LABSPEC 1.020 02/14/2019 2346   PHURINE 6.0 02/14/2019 2346   GLUCOSEU NEGATIVE 02/14/2019 2346   HGBUR SMALL (A) 02/14/2019 2346   BILIRUBINUR NEGATIVE 02/14/2019 2346   KETONESUR 5 (A) 02/14/2019 2346   PROTEINUR NEGATIVE 02/14/2019 2346   NITRITE NEGATIVE 02/14/2019 2346   LEUKOCYTESUR NEGATIVE 02/14/2019 2346  Sepsis Labs: @LABRCNTIP (procalcitonin:4,lacticidven:4)  ) Recent Results (from the past 240 hour(s))  SARS CORONAVIRUS 2 (TAT 6-24 HRS) Nasopharyngeal Nasopharyngeal Swab     Status: None   Collection Time: 02/15/19  1:27 AM   Specimen: Nasopharyngeal Swab  Result Value Ref Range Status   SARS Coronavirus 2 NEGATIVE NEGATIVE Final    Comment: (NOTE) SARS-CoV-2 target nucleic acids are NOT DETECTED. The SARS-CoV-2 RNA is generally detectable in upper and lower respiratory specimens during the acute phase of infection. Negative results do not preclude SARS-CoV-2 infection, do not rule out co-infections with other pathogens, and should not be used as the sole basis for treatment or other patient management decisions. Negative results must be combined with clinical observations, patient history, and epidemiological information. The expected result is Negative. Fact Sheet for Patients: HairSlick.nohttps://www.fda.gov/media/138098/download Fact Sheet for Healthcare Providers: quierodirigir.comhttps://www.fda.gov/media/138095/download This test is not yet approved or cleared by the [place] FDA  and  has been authorized for detection and/or diagnosis of SARS-CoV-2 by FDA under an Emergency Use Authorization (EUA). This EUA will remain  in effect (meaning this test can be used) for the duration of the COVID-19 declaration under Section 56 4(b)(1) of the Act, 21 U.S.C. section 360bbb-3(b)(1), unless the authorization is terminated or revoked sooner. Performed at [facility], [address] [place], [place] [address]       Studies: No results found.  Scheduled Meds: . enoxaparin (LOVENOX) injection  60 mg Subcutaneous Daily  . insulin aspart  0-9 Units Subcutaneous Q4H  . pantoprazole  40 mg Oral Daily  . polyethylene glycol  17 g Oral BID  . sodium chloride flush  3 mL Intravenous Once    Continuous Infusions: . lactated ringers 150 mL/hr at 02/16/19 2351     LOS: 2 days   Spent 25 minutes  Myrtie NeitherNwannadiya Oneal Biglow, MD [facility]  To reach me or the doctor on call, go to: [email or web address] Password [email or web address]  02/17/2019, 9:36 AM

## 2019-02-18 LAB — CBC
HCT: 40.3 % (ref 39.0–52.0)
Hemoglobin: 12.9 g/dL — ABNORMAL LOW (ref 13.0–17.0)
MCH: 27 pg (ref 26.0–34.0)
MCHC: 32 g/dL (ref 30.0–36.0)
MCV: 84.5 fL (ref 80.0–100.0)
Platelets: 223 10*3/uL (ref 150–400)
RBC: 4.77 MIL/uL (ref 4.22–5.81)
RDW: 13.2 % (ref 11.5–15.5)
WBC: 15.7 10*3/uL — ABNORMAL HIGH (ref 4.0–10.5)
nRBC: 0 % (ref 0.0–0.2)

## 2019-02-18 LAB — GLUCOSE, CAPILLARY
Glucose-Capillary: 102 mg/dL — ABNORMAL HIGH (ref 70–99)
Glucose-Capillary: 102 mg/dL — ABNORMAL HIGH (ref 70–99)
Glucose-Capillary: 104 mg/dL — ABNORMAL HIGH (ref 70–99)
Glucose-Capillary: 114 mg/dL — ABNORMAL HIGH (ref 70–99)
Glucose-Capillary: 115 mg/dL — ABNORMAL HIGH (ref 70–99)
Glucose-Capillary: 70 mg/dL (ref 70–99)

## 2019-02-18 LAB — LIPASE, BLOOD: Lipase: 20 U/L (ref 11–51)

## 2019-02-18 LAB — COMPREHENSIVE METABOLIC PANEL
ALT: 20 U/L (ref 0–44)
AST: 19 U/L (ref 15–41)
Albumin: 2.7 g/dL — ABNORMAL LOW (ref 3.5–5.0)
Alkaline Phosphatase: 83 U/L (ref 38–126)
Anion gap: 8 (ref 5–15)
BUN: 13 mg/dL (ref 6–20)
CO2: 29 mmol/L (ref 22–32)
Calcium: 8.6 mg/dL — ABNORMAL LOW (ref 8.9–10.3)
Chloride: 100 mmol/L (ref 98–111)
Creatinine, Ser: 0.66 mg/dL (ref 0.61–1.24)
GFR calc Af Amer: >60 mL/min (ref >60)
GFR calc non Af Amer: >60 mL/min (ref >60)
Glucose, Bld: 111 mg/dL — ABNORMAL HIGH (ref 70–99)
Potassium: 3.3 mmol/L — ABNORMAL LOW (ref 3.5–5.1)
Sodium: 137 mmol/L (ref 135–145)
Total Bilirubin: 1.2 mg/dL (ref 0.3–1.2)
Total Protein: 6.1 g/dL — ABNORMAL LOW (ref 6.5–8.1)

## 2019-02-18 MED ORDER — POTASSIUM CHLORIDE 20 MEQ/15ML (10%) PO SOLN
40.0000 meq | Freq: Once | ORAL | Status: AC
  Administered 2019-02-18: 40 meq via ORAL
  Filled 2019-02-18: qty 30

## 2019-02-18 NOTE — Progress Notes (Signed)
PROGRESS NOTE  Taylor Kidd ATF:[id number] DOB: 18-Jul-1970 DOA: 02/14/2019 PCP: Sharon Seller, NP   LOS: 3 days   Brief Narrative / Interim history: This is a 48 year old male with history of chronic low back pain, diet-controlled DM, hyperlipidemia, low testosterone, who presents to the hospital and is admitted on 02/14/2019 with abdominal pain, nausea and vomiting.  He was diagnosed with acute pancreatitis.  Lipase was elevated up to 750 and CT scan was consistent with acute pancreatitis.  With supportive treatment he appears to be gradually improving  Subjective / 24h Interval events: His pain is improved this morning, he rates it at 4 out of 10.  No significant nausea and vomiting and is able to tolerate clears.  Assessment & Plan: Principal Problem Acute pancreatitis -Minimal EtOH intake at baseline and no history of abuse, less likely cause for his pancreatitis -Triglycerides 67 on admission -Received aggressive IV fluids and his lipase has now normalized -Advance diet as tolerated, patient advised to avoid fatty foods for the next several weeks -CT scan without gallstones, gallbladder wall thickening, biliary dilatation  Active Problems Hyperlipidemia -Hold Lipitor  Scheduled Meds: . enoxaparin (LOVENOX) injection  60 mg Subcutaneous Daily  . insulin aspart  0-9 Units Subcutaneous Q4H  . pantoprazole  40 mg Oral Daily  . polyethylene glycol  17 g Oral BID  . sodium chloride flush  3 mL Intravenous Once   Continuous Infusions: . lactated ringers 150 mL/hr at 02/17/19 2313   PRN Meds:.acetaminophen **OR** acetaminophen, HYDROmorphone (DILAUDID) injection, ibuprofen, ondansetron (ZOFRAN) IV  DVT prophylaxis: Lovenox Code Status: Full code Family Communication: d/w patient  Disposition Plan: home when ready   Consultants:  None   Procedures:  None   Microbiology  None   Antimicrobials: None     Objective: Vitals:   02/17/19 1648 02/17/19 2125  02/18/19 0359 02/18/19 0500  BP: (!) 158/92 133/86 (!) 146/89   Pulse: 96 88 97   Resp: 20 20 19    Temp: 100 F (37.8 C) 99.2 F (37.3 C) 99.1 F (37.3 C)   TempSrc:  Oral Oral   SpO2: 94% 96% 98%   Weight:    118.2 kg  Height:        Intake/Output Summary (Last 24 hours) at 02/18/2019 1204 Last data filed at 02/18/2019 0331 Gross per 24 hour  Intake 3362.49 ml  Output --  Net 3362.49 ml   Filed Weights   02/16/19 0410 02/17/19 0414 02/18/19 0500  Weight: 117.8 kg 117.8 kg 118.2 kg    Examination:  Constitutional: NAD Eyes: no scleral icterus ENMT: Mucous membranes are moist.  Neck: normal, supple Respiratory: clear to auscultation bilaterally, no wheezing, no crackles. Normal respiratory effort.  Cardiovascular: Regular rate and rhythm, no murmurs / rubs / gallops. No LE edema.  Abdomen: non distended. Bowel sounds positive.  Minimal tenderness in the epigastric area Musculoskeletal: no clubbing / cyanosis.  Skin: no rashes appreciated  Neurologic: Grossly nonfocal, moves all 4 independently   Data Reviewed: I have independently reviewed following labs and imaging studies   CBC: Recent Labs  Lab 02/14/19 2054 02/15/19 0500 02/18/19 0812  WBC 19.4* 17.7* 15.7*  HGB 16.0 15.4 12.9*  HCT 48.4 47.7 40.3  MCV 84.0 83.8 84.5  PLT 253 215 223   Basic Metabolic Panel: Recent Labs  Lab 02/14/19 2054 02/15/19 0500 02/18/19 0812  NA 139 136 137  K 3.9 3.9 3.3*  CL 101 101 100  CO2 27 24 29   GLUCOSE 137* 154*  111*  BUN 20 19 13   CREATININE 1.00 0.67 0.66  CALCIUM 9.6 9.2 8.6*   Liver Function Tests: Recent Labs  Lab 02/14/19 2054 02/15/19 0500 02/18/19 0812  AST 30 27 19   ALT 32 28 20  ALKPHOS 94 87 83  BILITOT 0.7 1.0 1.2  PROT 7.7 7.9 6.1*  ALBUMIN 4.4 4.3 2.7*   Coagulation Profile: No results for input(s): INR, PROTIME in the last 168 hours. HbA1C: No results for input(s): HGBA1C in the last 72 hours. CBG: Recent Labs  Lab  02/17/19 2128 02/17/19 2358 02/18/19 0401 02/18/19 0741 02/18/19 1132  GLUCAP 102* 112* 102* 114* 115*    Recent Results (from the past 240 hour(s))  SARS CORONAVIRUS 2 (TAT 6-24 HRS) Nasopharyngeal Nasopharyngeal Swab     Status: None   Collection Time: 02/15/19  1:27 AM   Specimen: Nasopharyngeal Swab  Result Value Ref Range Status   SARS Coronavirus 2 NEGATIVE NEGATIVE Final    Comment: (NOTE) SARS-CoV-2 target nucleic acids are NOT DETECTED. The SARS-CoV-2 RNA is generally detectable in upper and lower respiratory specimens during the acute phase of infection. Negative results do not preclude SARS-CoV-2 infection, do not rule out co-infections with other pathogens, and should not be used as the sole basis for treatment or other patient management decisions. Negative results must be combined with clinical observations, patient history, and epidemiological information. The expected result is Negative. Fact Sheet for Patients: SugarRoll.be Fact Sheet for Healthcare Providers: https://www.woods-mathews.com/ This test is not yet approved or cleared by the [place] FDA and  has been authorized for detection and/or diagnosis of SARS-CoV-2 by FDA under an Emergency Use Authorization (EUA). This EUA will remain  in effect (meaning this test can be used) for the duration of the COVID-19 declaration under Section 56 4(b)(1) of the Act, 21 U.S.C. section 360bbb-3(b)(1), unless the authorization is terminated or revoked sooner. Performed at [facility], [place] [address] [place], [place] [address]      Radiology Studies: No results found.  Marzetta Board, MD, PhD [facility]  Between 7 am - 7 pm I am available, please contact me via Amion or Securechat  Between 7 pm - 7 am I am not available, please contact night coverage MD/APP via Amion

## 2019-02-19 LAB — GLUCOSE, CAPILLARY
Glucose-Capillary: 101 mg/dL — ABNORMAL HIGH (ref 70–99)
Glucose-Capillary: 108 mg/dL — ABNORMAL HIGH (ref 70–99)
Glucose-Capillary: 119 mg/dL — ABNORMAL HIGH (ref 70–99)
Glucose-Capillary: 125 mg/dL — ABNORMAL HIGH (ref 70–99)
Glucose-Capillary: 177 mg/dL — ABNORMAL HIGH (ref 70–99)
Glucose-Capillary: 95 mg/dL (ref 70–99)

## 2019-02-19 MED ORDER — HYDROMORPHONE HCL 1 MG/ML IJ SOLN
1.0000 mg | Freq: Three times a day (TID) | INTRAMUSCULAR | Status: DC | PRN
  Administered 2019-02-19: 1 mg via INTRAVENOUS
  Filled 2019-02-19: qty 1

## 2019-02-19 MED ORDER — OXYCODONE HCL 5 MG PO TABS
10.0000 mg | ORAL_TABLET | ORAL | Status: DC | PRN
  Administered 2019-02-19 – 2019-02-20 (×5): 10 mg via ORAL
  Filled 2019-02-19 (×5): qty 2

## 2019-02-19 MED ORDER — MAGNESIUM SULFATE 2 GM/50ML IV SOLN
2.0000 g | Freq: Once | INTRAVENOUS | Status: AC
  Administered 2019-02-19: 2 g via INTRAVENOUS
  Filled 2019-02-19: qty 50

## 2019-02-19 NOTE — Progress Notes (Signed)
PROGRESS NOTE  Taylor Kidd VHQ:[id number] DOB: [birth year] DOA: 02/14/2019 PCP: Lauree Chandler, NP   LOS: 4 days   Brief Narrative / Interim history: This is a 48 year old male with history of chronic low back pain, diet-controlled DM, hyperlipidemia, low testosterone, who presents to the hospital and is admitted on 02/14/2019 with abdominal pain, nausea and vomiting.  He was diagnosed with acute pancreatitis.  Lipase was elevated up to 750 and CT scan was consistent with acute pancreatitis.  With supportive treatment he appears to be gradually improving  Subjective / 24h Interval events: Feels better but still requiring intravenous pain medications overnight for severe pain at times  Assessment & Plan: Principal Problem Acute pancreatitis -Minimal EtOH intake at baseline and no history of abuse, less likely cause for his pancreatitis -Triglycerides 67 on admission -Received aggressive IV fluids and his lipase has now normalized -Advance diet as tolerated, patient advised to avoid fatty foods for the next several weeks -CT scan without gallstones, gallbladder wall thickening, biliary dilatation -Advance diet even more today, he is gradually improved, add oral pain medications and space out intravenous ones, discussed with the patient to avoid IV pain medications, if he tolerates a regular diet and comfortable on oral meds may build to be discharged by tomorrow.  Active Problems Hyperlipidemia -Hold Lipitor  Scheduled Meds: . enoxaparin (LOVENOX) injection  60 mg Subcutaneous Daily  . insulin aspart  0-9 Units Subcutaneous Q4H  . pantoprazole  40 mg Oral Daily  . polyethylene glycol  17 g Oral BID  . sodium chloride flush  3 mL Intravenous Once   Continuous Infusions:  PRN Meds:.acetaminophen **OR** acetaminophen, HYDROmorphone (DILAUDID) injection, ibuprofen, ondansetron (ZOFRAN) IV, oxyCODONE  DVT prophylaxis: Lovenox Code Status: Full code Family Communication: d/w  patient  Disposition Plan: home when ready   Consultants:  None   Procedures:  None   Microbiology  None   Antimicrobials: None     Objective: Vitals:   02/18/19 1316 02/18/19 2023 02/19/19 0433 02/19/19 0500  BP: 129/72 140/80 131/84   Pulse: 90 86 76   Resp: 20 20 20    Temp: 98.2 F (36.8 C) 99.6 F (37.6 C) 98 F (36.7 C)   TempSrc:  Oral Oral   SpO2: 97% 99% 99%   Weight:    118.5 kg  Height:        Intake/Output Summary (Last 24 hours) at 02/19/2019 1126 Last data filed at 02/19/2019 1021 Gross per 24 hour  Intake 2790.57 ml  Output --  Net 2790.57 ml   Filed Weights   02/17/19 0414 02/18/19 0500 02/19/19 0500  Weight: 117.8 kg 118.2 kg 118.5 kg    Examination:  Constitutional: NAD Eyes: no icterus  ENMT: mmm Neck: normal, supple Respiratory: Clear to auscultation bilaterally without wheezing or crackles heard Cardiovascular: Regular rate and rhythm, no murmurs.  No peripheral edema Abdomen: Soft, nondistended, no new tenderness Musculoskeletal: no clubbing / cyanosis.  Skin: No rashes appreciated Neurologic: Grossly nonfocal, moves all 4 independently   Data Reviewed: I have independently reviewed following labs and imaging studies   CBC: Recent Labs  Lab 02/14/19 2054 02/15/19 0500 02/18/19 0812  WBC 19.4* 17.7* 15.7*  HGB 16.0 15.4 12.9*  HCT 48.4 47.7 40.3  MCV 84.0 83.8 84.5  PLT 253 215 413   Basic Metabolic Panel: Recent Labs  Lab 02/14/19 2054 02/15/19 0500 02/18/19 0812  NA 139 136 137  K 3.9 3.9 3.3*  CL 101 101 100  CO2 27 24 29  GLUCOSE 137* 154* 111*  BUN 20 19 13   CREATININE 1.00 0.67 0.66  CALCIUM 9.6 9.2 8.6*   Liver Function Tests: Recent Labs  Lab 02/14/19 2054 02/15/19 0500 02/18/19 0812  AST 30 27 19   ALT 32 28 20  ALKPHOS 94 87 83  BILITOT 0.7 1.0 1.2  PROT 7.7 7.9 6.1*  ALBUMIN 4.4 4.3 2.7*   Coagulation Profile: No results for input(s): INR, PROTIME in the last 168 hours. HbA1C: No  results for input(s): HGBA1C in the last 72 hours. CBG: Recent Labs  Lab 02/18/19 1616 02/18/19 2025 02/18/19 2349 02/19/19 0430 02/19/19 0755  GLUCAP 70 104* 102* 101* 95    Recent Results (from the past 240 hour(s))  SARS CORONAVIRUS 2 (TAT 6-24 HRS) Nasopharyngeal Nasopharyngeal Swab     Status: None   Collection Time: 02/15/19  1:27 AM   Specimen: Nasopharyngeal Swab  Result Value Ref Range Status   SARS Coronavirus 2 NEGATIVE NEGATIVE Final    Comment: (NOTE) SARS-CoV-2 target nucleic acids are NOT DETECTED. The SARS-CoV-2 RNA is generally detectable in upper and lower respiratory specimens during the acute phase of infection. Negative results do not preclude SARS-CoV-2 infection, do not rule out co-infections with other pathogens, and should not be used as the sole basis for treatment or other patient management decisions. Negative results must be combined with clinical observations, patient history, and epidemiological information. The expected result is Negative. Fact Sheet for Patients: 02/21/19 Fact Sheet for Healthcare Providers: 02/17/19 This test is not yet approved or cleared by the [place] FDA and  has been authorized for detection and/or diagnosis of SARS-CoV-2 by FDA under an Emergency Use Authorization (EUA). This EUA will remain  in effect (meaning this test can be used) for the duration of the COVID-19 declaration under Section 56 4(b)(1) of the Act, 21 U.S.C. section 360bbb-3(b)(1), unless the authorization is terminated or revoked sooner. Performed at [facility], [address], [place], [address] [place]      Radiology Studies: No results found.  [place], MD, PhD [facility]  Between 7 am - 7 pm I am available, please contact me via Amion or Securechat  Between 7 pm - 7 am I am not available, please contact night coverage MD/APP via Amion

## 2019-02-20 ENCOUNTER — Ambulatory Visit: Payer: Self-pay | Admitting: Nurse Practitioner

## 2019-02-20 DIAGNOSIS — R7989 Other specified abnormal findings of blood chemistry: Secondary | ICD-10-CM

## 2019-02-20 LAB — GLUCOSE, CAPILLARY
Glucose-Capillary: 86 mg/dL (ref 70–99)
Glucose-Capillary: 132 mg/dL — ABNORMAL HIGH (ref 70–99)

## 2019-02-20 MED ORDER — ONDANSETRON HCL 4 MG PO TABS: 4.0000 mg | ORAL_TABLET | Freq: Every day | ORAL | 1 refills | Status: DC | PRN

## 2019-02-20 MED ORDER — OXYCODONE HCL 10 MG PO TABS: 10.0000 mg | ORAL_TABLET | ORAL | 0 refills | Status: AC | PRN

## 2019-02-20 NOTE — Progress Notes (Signed)
Went over discharge instructions w/ patient. Pt verbalized understanding.  

## 2019-02-20 NOTE — Discharge Summary (Signed)
Physician Discharge Summary  Antuane Eastridge ZTI:[id number] DOB: [birth year] DOA: 02/14/2019  PCP: Lauree Chandler, NP  Admit date: 02/14/2019 Discharge date: 02/20/2019  Admitted From: home Disposition:  home  Recommendations for Outpatient Follow-up:  1. Follow up with PCP in 1-2 weeks  Home Health: none Equipment/Devices: none  Discharge Condition: stable CODE STATUS: Full code Diet recommendation: low fat   HPI: Per admitting MD, Ralphie Lovelady  is a 48 y.o. male, w Dm2 (diet controlled0, hyperlipidemia, Jerrye Bushy apparently presents with epigastric pain starting last nite.  Pt  Noted etoh use 1 glass of vodka about 2 days ago.  Pt states drinks minimally.  Pt denies family hx of pancreatitis, or recent changes in medications. In ED,  T 98.6, P 91, R 16, Bp 146/97  Pox 98% on RA Wt 115.7kg CT abd pelvis IMPRESSION: 1. Findings most compatible with acute interstitial edematous pancreatitis in the setting of elevated lipase. Fairly uniform enhancement of the pancreatic parenchyma accounting for focal fatty infiltration of the pancreatic head seen on comparison study. Small amount of peripancreatic free fluid towards the pancreatic body and tail without organized collection. 2. Milder paraduodenal stranding is likely secondary to pancreatic inflammation in the absence of frank duodenal wall thickening Na 139, K 3.9,  Bun 20, Creatinine 1.0 Ast 30, Alt 32, alk phos 94, T. Bili 0.7 Lipase 2,750 Tg 84 Pt will be admitted for acute pancreatitis  Hospital Course / Discharge diagnoses: Acute pancreatitis -Minimal EtOH intake at baseline and no history of abuse, less likely cause for his pancreatitis. Triglycerides 67 on admission. Received aggressive IV fluids and his lipase has now normalized, clinically improved and able to tolerate a regular diet. His pain is controled on oral oxycodone and will be given a short course for home  Active Problems Hyperlipidemia -continue home  medications.  Discharge Instructions   Allergies as of 02/20/2019   No Known Allergies     Medication List    STOP taking these medications   traMADol 50 MG tablet Commonly known as: ULTRAM     TAKE these medications   acetaminophen 500 MG tablet Commonly known as: TYLENOL Take 500 mg by mouth as needed.   atorvastatin 20 MG tablet Commonly known as: LIPITOR Take 1 tablet (20 mg total) by mouth daily.   L-Arginine 500 MG Tabs 2 by mouth daily   MULTIVITAMIN PO Take 1 tablet by mouth daily.   ondansetron 4 MG tablet Commonly known as: Zofran Take 1 tablet (4 mg total) by mouth daily as needed for nausea or vomiting.   Oxycodone HCl 10 MG Tabs Take 1 tablet (10 mg total) by mouth every 4 (four) hours as needed for up to 5 days for moderate pain.   Testosterone 30 MG/ACT Soln Apply one pump under the arm once daily      Follow-up Information    Lauree Chandler, NP. Schedule an appointment as soon as possible for a visit in 2 week(s).   Specialty: Geriatric Medicine Contact information: [place]. [place] [phone]           Consultations:  None   Procedures/Studies:  CT ABDOMEN PELVIS W CONTRAST  Result Date: 02/15/2019 CLINICAL DATA:  One day of epigastric pain with nausea vomiting, leukocytosis EXAM: CT ABDOMEN AND PELVIS WITH CONTRAST TECHNIQUE: Multidetector CT imaging of the abdomen and pelvis was performed using the standard protocol following bolus administration of intravenous contrast. CONTRAST:  11m OMNIPAQUE IOHEXOL 300 MG/ML  SOLN COMPARISON:  CT abdomen pelvis 08/30/2006 FINDINGS: Lower chest: There are dependent atelectatic changes in the lung bases. No consolidation or visible effusion. Normal heart size. No pericardial effusion. Hepatobiliary: No focal liver abnormality is seen. No gallstones, gallbladder wall thickening, or biliary dilatation. Pancreas: Diffusely edematous appearance of the partially fatty  replaced pancreas. There is some reactive peripancreatic fluid stranding predominantly along the body and tail of the pancreas. Parenchyma enhances uniformly accounting for more focal fatty infiltration at the pancreatic head seen on comparison study. Spleen: Normal in size without focal abnormality. Adrenals/Urinary Tract: Adrenal glands are unremarkable. Kidneys are normal, without renal calculi, focal lesion, or hydronephrosis. Bladder is unremarkable. Stomach/Bowel: Distal esophagus and stomach are unremarkable. Mild periduodenal stranding is likely secondary to pancreatic inflammation in the absence of frank duodenal wall thickening. No small bowel dilatation or wall thickening. A normal appendix is visualized. No colonic dilatation or wall thickening. Vascular/Lymphatic: The aorta is normal caliber. Upper abdominal reactive adenopathy is seen. No suspicious lymph nodes in the included lymphatic chains. Reproductive: The prostate and seminal vesicles are unremarkable. Other: Edematous change and small volume of peripancreatic fluid about the pancreatic tail without organized collection. No other free fluid or free for air in the abdomen or pelvis. Small fat containing umbilical hernia. No bowel containing hernia. Musculoskeletal: Multilevel degenerative changes are present in the imaged portions of the spine. No acute osseous abnormality or suspicious osseous lesion. IMPRESSION: 1. Findings most compatible with acute interstitial edematous pancreatitis in the setting of elevated lipase. Fairly uniform enhancement of the pancreatic parenchyma accounting for focal fatty infiltration of the pancreatic head seen on comparison study. Small amount of peripancreatic free fluid towards the pancreatic body and tail without organized collection. 2. Milder paraduodenal stranding is likely secondary to pancreatic inflammation in the absence of frank duodenal wall thickening These results were called by telephone at the  time of interpretation on 02/15/2019 at 1:26 am to provider Montine Circle , who verbally acknowledged these results. Electronically Signed   By: Lovena Le M.D.   On: 02/15/2019 01:26      Subjective: - no chest pain, shortness of breath, no abdominal pain, nausea or vomiting.   Discharge Exam: BP 129/85 (BP Location: Left Arm)   Pulse 79   Temp 98.9 F (37.2 C)   Resp 17   Ht '6\' 3"'  (1.905 m)   Wt 116.4 kg   SpO2 98%   BMI 32.07 kg/m   General: Pt is alert, awake, not in acute distress Cardiovascular: RRR, S1/S2 +, no rubs, no gallops Respiratory: CTA bilaterally, no wheezing, no rhonchi Abdominal: Soft, NT, ND, bowel sounds + Extremities: no edema, no cyanosis    The results of significant diagnostics from this hospitalization (including imaging, microbiology, ancillary and laboratory) are listed below for reference.     Microbiology: Recent Results (from the past 240 hour(s))  SARS CORONAVIRUS 2 (TAT 6-24 HRS) Nasopharyngeal Nasopharyngeal Swab     Status: None   Collection Time: 02/15/19  1:27 AM   Specimen: Nasopharyngeal Swab  Result Value Ref Range Status   SARS Coronavirus 2 NEGATIVE NEGATIVE Final    Comment: (NOTE) SARS-CoV-2 target nucleic acids are NOT DETECTED. The SARS-CoV-2 RNA is generally detectable in upper and lower respiratory specimens during the acute phase of infection. Negative results do not preclude SARS-CoV-2 infection, do not rule out co-infections with other pathogens, and should not be used as the sole basis for treatment or other patient management decisions. Negative results must be combined with clinical  observations, patient history, and epidemiological information. The expected result is Negative. Fact Sheet for Patients: [employer] Fact Sheet for Healthcare Providers: https://www.woods-mathews.com/ This test is not yet approved or cleared by the [place] FDA and  has been  authorized for detection and/or diagnosis of SARS-CoV-2 by FDA under an Emergency Use Authorization (EUA). This EUA will remain  in effect (meaning this test can be used) for the duration of the COVID-19 declaration under Section 56 4(b)(1) of the Act, 21 U.S.C. section 360bbb-3(b)(1), unless the authorization is terminated or revoked sooner. Performed at [facility], [place] [address] [place], [place] [address]      Labs: Basic Metabolic Panel: Recent Labs  Lab 02/14/19 2054 02/15/19 0500 02/18/19 0812  NA 139 136 137  K 3.9 3.9 3.3*  CL 101 101 100  CO2 '27 24 29  ' GLUCOSE 137* 154* 111*  BUN '20 19 13  ' CREATININE 1.00 0.67 0.66  CALCIUM 9.6 9.2 8.6*   Liver Function Tests: Recent Labs  Lab 02/14/19 2054 02/15/19 0500 02/18/19 0812  AST '30 27 19  ' ALT 32 28 20  ALKPHOS 94 87 83  BILITOT 0.7 1.0 1.2  PROT 7.7 7.9 6.1*  ALBUMIN 4.4 4.3 2.7*   CBC: Recent Labs  Lab 02/14/19 2054 02/15/19 0500 02/18/19 0812  WBC 19.4* 17.7* 15.7*  HGB 16.0 15.4 12.9*  HCT 48.4 47.7 40.3  MCV 84.0 83.8 84.5  PLT 253 215 223   CBG: Recent Labs  Lab 02/19/19 1547 02/19/19 1951 02/19/19 2336 02/20/19 0312 02/20/19 0743  GLUCAP 108* 177* 125* 86 132*   Hgb A1c No results for input(s): HGBA1C in the last 72 hours. Lipid Profile No results for input(s): CHOL, HDL, LDLCALC, TRIG, CHOLHDL, LDLDIRECT in the last 72 hours. Thyroid function studies No results for input(s): TSH, T4TOTAL, T3FREE, THYROIDAB in the last 72 hours.  Invalid input(s): FREET3 Urinalysis    Component Value Date/Time   COLORURINE YELLOW 02/14/2019 [place] 02/14/2019 2346   LABSPEC 1.020 02/14/2019 2346   PHURINE 6.0 02/14/2019 2346   GLUCOSEU NEGATIVE 02/14/2019 2346   HGBUR SMALL (A) 02/14/2019 2346   BILIRUBINUR NEGATIVE 02/14/2019 2346   KETONESUR 5 (A) 02/14/2019 2346   PROTEINUR NEGATIVE 02/14/2019 2346   NITRITE NEGATIVE 02/14/2019 2346   LEUKOCYTESUR NEGATIVE  02/14/2019 2346    FURTHER DISCHARGE INSTRUCTIONS:   Get Medicines reviewed and adjusted: Please take all your medications with you for your next visit with your Primary MD   Laboratory/radiological data: Please request your Primary MD to go over all hospital tests and procedure/radiological results at the follow up, please ask your Primary MD to get all Hospital records sent to his/her office.   In some cases, they will be blood work, cultures and biopsy results pending at the time of your discharge. Please request that your primary care M.D. goes through all the records of your hospital data and follows up on these results.   Also Note the following: If you experience worsening of your admission symptoms, develop shortness of breath, life threatening emergency, suicidal or homicidal thoughts you must seek medical attention immediately by calling 911 or calling your MD immediately  if symptoms less severe.   You must read complete instructions/literature along with all the possible adverse reactions/side effects for all the Medicines you take and that have been prescribed to you. Take any new Medicines after you have completely understood and accpet all the possible adverse reactions/side effects.    Do  not drive when taking Pain medications or sleeping medications (Benzodaizepines)   Do not take more than prescribed Pain, Sleep and Anxiety Medications. It is not advisable to combine anxiety,sleep and pain medications without talking with your primary care practitioner   Special Instructions: If you have smoked or chewed Tobacco  in the last 2 yrs please stop smoking, stop any regular Alcohol  and or any Recreational drug use.   Wear Seat belts while driving.   Please note: You were cared for by a hospitalist during your hospital stay. Once you are discharged, your primary care physician will handle any further medical issues. Please note that NO REFILLS for any discharge medications will  be authorized once you are discharged, as it is imperative that you return to your primary care physician (or establish a relationship with a primary care physician if you do not have one) for your post hospital discharge needs so that they can reassess your need for medications and monitor your lab values.  Time coordinating discharge: 35 minutes  SIGNED:  Marzetta Board, MD, PhD 02/20/2019, 8:04 AM

## 2019-02-20 NOTE — Discharge Instructions (Addendum)
Follow with Lauree Chandler, NP in 5-7 days  STOP L arginine  Please get a complete blood count and chemistry panel checked by your Primary MD at your next visit, and again as instructed by your Primary MD. Please get your medications reviewed and adjusted by your Primary MD.  Please request your Primary MD to go over all Hospital Tests and Procedure/Radiological results at the follow up, please get all Hospital records sent to your Prim MD by signing hospital release before you go home.  In some cases, there will be blood work, cultures and biopsy results pending at the time of your discharge. Please request that your primary care M.D. goes through all the records of your hospital data and follows up on these results.  If you had Pneumonia of Lung problems at the Hospital: Please get a 2 view Chest X ray done in 6-8 weeks after hospital discharge or sooner if instructed by your Primary MD.  If you have Congestive Heart Failure: Please call your Cardiologist or Primary MD anytime you have any of the following symptoms:  1) 3 pound weight gain in 24 hours or 5 pounds in 1 week  2) shortness of breath, with or without a dry hacking cough  3) swelling in the hands, feet or stomach  4) if you have to sleep on extra pillows at night in order to breathe  Follow cardiac low salt diet and 1.5 lit/day fluid restriction.  If you have diabetes Accuchecks 4 times/day, Once in AM empty stomach and then before each meal. Log in all results and show them to your primary doctor at your next visit. If any glucose reading is under 80 or above 300 call your primary MD immediately.  If you have Seizure/Convulsions/Epilepsy: Please do not drive, operate heavy machinery, participate in activities at heights or participate in high speed sports until you have seen by Primary MD or a Neurologist and advised to do so again. Per [facility] statutes, patients with seizures are not allowed to drive until  they have been seizure-free for six months.  Use caution when using heavy equipment or power tools. Avoid working on ladders or at heights. Take showers instead of baths. Ensure the water temperature is not too high on the home water heater. Do not go swimming alone. Do not lock yourself in a room alone (i.e. bathroom). When caring for infants or small children, sit down when holding, feeding, or changing them to minimize risk of injury to the child in the event you have a seizure. Maintain good sleep hygiene. Avoid alcohol.   If you had Gastrointestinal Bleeding: Please ask your Primary MD to check a complete blood count within one week of discharge or at your next visit. Your endoscopic/colonoscopic biopsies that are pending at the time of discharge, will also need to followed by your Primary MD.  Get Medicines reviewed and adjusted. Please take all your medications with you for your next visit with your Primary MD  Please request your Primary MD to go over all hospital tests and procedure/radiological results at the follow up, please ask your Primary MD to get all Hospital records sent to his/her office.  If you experience worsening of your admission symptoms, develop shortness of breath, life threatening emergency, suicidal or homicidal thoughts you must seek medical attention immediately by calling 911 or calling your MD immediately  if symptoms less severe.  You must read complete instructions/literature along with all the possible adverse reactions/side effects for  all the Medicines you take and that have been prescribed to you. Take any new Medicines after you have completely understood and accpet all the possible adverse reactions/side effects.   Do not drive or operate heavy machinery when taking Pain medications.   Do not take more than prescribed Pain, Sleep and Anxiety Medications  Special Instructions: If you have smoked or chewed Tobacco  in the last 2 yrs please stop smoking, stop  any regular Alcohol  and or any Recreational drug use.  Wear Seat belts while driving.  Please note You were cared for by a hospitalist during your hospital stay. If you have any questions about your discharge medications or the care you received while you were in the hospital after you are discharged, you can call the unit and asked to speak with the hospitalist on call if the hospitalist that took care of you is not available. Once you are discharged, your primary care physician will handle any further medical issues. Please note that NO REFILLS for any discharge medications will be authorized once you are discharged, as it is imperative that you return to your primary care physician (or establish a relationship with a primary care physician if you do not have one) for your aftercare needs so that they can reassess your need for medications and monitor your lab values.  You can reach the hospitalist office at phone [phone] or fax [phone]   If you do not have a primary care physician, you can call [phone] for a physician referral.  Activity: As tolerated with Full fall precautions use walker/cane & assistance as needed    Diet: low fat  Disposition Home   Acute Pancreatitis  Acute pancreatitis happens when the pancreas gets swollen. The pancreas is a large gland in the body that helps to control blood sugar. It also makes enzymes that help to digest food. This condition can last a few days and cause serious problems. The lungs, heart, and kidneys may stop working. What are the causes? Causes include:  Alcohol abuse.  Drug abuse.  Gallstones.  A tumor in the pancreas. Other causes include:  Some medicines.  Some chemicals.  Diabetes.  An infection.  Damage caused by an accident.  The poison (venom) from a scorpion bite.  Belly (abdominal) surgery.  The body's defense system (immune system) attacking the pancreas (autoimmune pancreatitis).  Genes that are  passed from parent to child (inherited). In some cases, the cause is not known. What are the signs or symptoms?  Pain in the upper belly that may be felt in the back. The pain may be very bad.  Swelling of the belly.  Feeling sick to your stomach (nauseous) and throwing up (vomiting).  Fever. How is this treated? You will likely have to stay in the hospital. Treatment may include:  Pain medicine.  Fluid through an IV tube.  Placing a tube in the stomach to take out the stomach contents. This may help you stop throwing up.  Not eating for 3-4 days.  Antibiotic medicines, if you have an infection.  Treating any other problems that may be the cause.  Steroid medicines, if your problem is caused by your defense system attacking your body's own tissues.  Surgery. Follow these instructions at home: Eating and drinking   Follow instructions from your doctor about what to eat and drink.  Eat foods that do not have a lot of fat in them.  Eat small meals often. Do not eat big meals.  Drink enough fluid to keep your pee (urine) pale yellow.  Do not drink alcohol if it caused your condition. Medicines  Take over-the-counter and prescription medicines only as told by your doctor.  Ask your doctor if the medicine prescribed to you: ? Requires you to avoid driving or using heavy machinery. ? Can cause trouble pooping (constipation). You may need to take steps to prevent or treat trouble pooping:  Take over-the-counter or prescription medicines.  Eat foods that are high in fiber. These include beans, whole grains, and fresh fruits and vegetables.  Limit foods that are high in fat and sugar. These include fried or sweet foods. General instructions  Do not use any products that contain nicotine or tobacco, such as cigarettes, e-cigarettes, and chewing tobacco. If you need help quitting, ask your doctor.  Get plenty of rest.  Check your blood sugar at home as told by your  doctor.  Keep all follow-up visits as told by your doctor. This is important. Contact a doctor if:  You do not get better as quickly as expected.  You have new symptoms.  Your symptoms get worse.  You have pain or weakness that lasts a long time.  You keep feeling sick to your stomach.  You get better and then you have pain again.  You have a fever. Get help right away if:  You cannot eat or keep fluids down.  Your pain gets very bad.  Your skin or the white part of your eyes turns yellow.  You have sudden swelling in your belly.  You throw up.  You feel dizzy or you pass out (faint).  Your blood sugar is high (over 300 mg/dL). Summary  Acute pancreatitis happens when the pancreas gets swollen.  This condition is often caused by alcohol abuse, drug abuse, or gallstones.  You will likely have to stay in the hospital for treatment. This information is not intended to replace advice given to you by your health care provider. Make sure you discuss any questions you have with your health care provider. Document Released: 07/27/2007 Document Revised: 11/27/2017 Document Reviewed: 11/27/2017 Elsevier Patient Education  2020 [employer].

## 2019-02-21 ENCOUNTER — Other Ambulatory Visit: Payer: Self-pay | Admitting: Nurse Practitioner

## 2019-02-21 ENCOUNTER — Telehealth: Payer: Self-pay | Admitting: *Deleted

## 2019-02-21 NOTE — Telephone Encounter (Signed)
Yes we will not be giving his any oxycodone. We will have to look at when the tramadol is due and he does need a visit before this can be changed.

## 2019-02-21 NOTE — Telephone Encounter (Signed)
Transition Care Management Follow-up Telephone Call  Date of discharge and from where: 02/20/2019 [facility]   How have you been since you were released from the hospital? Better, stomach still alittle sore  Any questions or concerns? No   Items Reviewed:  Did the pt receive and understand the discharge instructions provided? Yes   Medications obtained and verified? Yes   Any new allergies since your discharge? No   Dietary orders reviewed? Yes  Do you have support at home? Yes   Other (ie: DME, Home Health, etc) No  Functional Questionnaire: (I = Independent and D = Dependent) ADL's: I  Bathing/Dressing- I   Meal Prep- I  Eating- I  Maintaining continence- I  Transferring/Ambulation- I  Managing Meds- I   Follow up appointments reviewed:    PCP Hospital f/u appt confirmed? Yes  Scheduled to see Dinah on 02/25/18 .  [facility] f/u appt confirmed? Yes    Are transportation arrangements needed? No   If their condition worsens, is the pt aware to call  their PCP or go to the ED? Yes  Was the patient provided with contact information for the PCP's office or ED? Yes  Was the pt encouraged to call back with questions or concerns? Yes

## 2019-02-21 NOTE — Telephone Encounter (Signed)
I have made the 1st attempt to contact the patient or family member in charge, in order to follow up from recently being discharged from the hospital. I left a message on voicemail but I will make another attempt at a different time.  

## 2019-02-21 NOTE — Telephone Encounter (Signed)
LMOM to return call.

## 2019-02-21 NOTE — Telephone Encounter (Signed)
Patient called and stated that the hospital gave him a Rx for Oxycodone. Patient stated that he HAS NOT gotten it refilled. Patient is still having some pain and wants to know if he can have his Tramadol refilled instead.  Hospital has taken out the Tramadol and updated his medication list to Oxycodone.  Patient has an appointment to see Dinah on Tuesday for a TOC and Narcotic Contract update. I offered him an appointment for a TeleVisit/Mychart today with you but he stated that he is going out of town and he wants to be seen in office for a evaluation.  Patient is requesting his Tramadol to be reinstated. Please Advise.

## 2019-02-25 NOTE — Telephone Encounter (Signed)
Patient notified and will be at appointment tomorrow to discuss.

## 2019-02-26 ENCOUNTER — Other Ambulatory Visit: Payer: Self-pay

## 2019-02-26 ENCOUNTER — Encounter: Payer: Self-pay | Admitting: Family

## 2019-02-26 ENCOUNTER — Ambulatory Visit (INDEPENDENT_AMBULATORY_CARE_PROVIDER_SITE_OTHER): Payer: BC Managed Care – PPO | Admitting: Family

## 2019-02-26 VITALS — BP 128/78 | HR 77 | Temp 96.9°F | Ht 75.0 in | Wt 250.4 lb

## 2019-02-26 DIAGNOSIS — M546 Pain in thoracic spine: Secondary | ICD-10-CM | POA: Diagnosis not present

## 2019-02-26 DIAGNOSIS — G8929 Other chronic pain: Secondary | ICD-10-CM

## 2019-02-26 DIAGNOSIS — M5441 Lumbago with sciatica, right side: Secondary | ICD-10-CM

## 2019-02-26 DIAGNOSIS — G894 Chronic pain syndrome: Secondary | ICD-10-CM

## 2019-02-26 DIAGNOSIS — E782 Mixed hyperlipidemia: Secondary | ICD-10-CM

## 2019-02-26 DIAGNOSIS — K859 Acute pancreatitis without necrosis or infection, unspecified: Secondary | ICD-10-CM

## 2019-02-26 MED ORDER — TRAMADOL HCL 50 MG PO TABS: 50.0000 mg | ORAL_TABLET | Freq: Three times a day (TID) | ORAL | 0 refills | Status: DC | PRN

## 2019-02-26 NOTE — Progress Notes (Signed)
Provider: Zeeva Courser FNP-C  Lauree Chandler, NP  Patient Care Team: Lauree Chandler, NP as PCP - General (Geriatric Medicine) Suella Broad, MD as Consulting Physician (Physical Medicine and Rehabilitation) Berle Mull, MD as Consulting Physician (Sports Medicine)  Extended Emergency Contact Information Primary Emergency Contact: [facility] Address: [address]          [place], [place] [address] Johnnette Litter of [place] Phone: [phone] Mobile Phone: [phone] Relation: Spouse  Code Status: Full Code  Goals of care: Advanced Directive information Advanced Directives 02/15/2019  Does Patient Have a Medical Advance Directive? No  Would patient like information on creating a medical advance directive? No - Patient declined     Chief Complaint  Patient presents with   Transitions Of Care    Hospitalization from 02/14/2019 - 02/20/2019  acute pancreatitis patient still feel weak patient states back pain is about 5 for level of pain and stomach is 2 for level pain    Medication Management    tramadol 50 1 tab po tid prn patient states he was on before went into hospital.     HPI:  Pt is a 49 y.o. male seen today for transition of care post Hospitalization from 02/14/2019 - 02/20/2019  for acute pancreatitis post alcohol use I glass of Vodka.His lipase was elevated.He had aggressive IV fluids during hospitalization and lipase normalized.His condition improved and tolerated a regular diet.His pain was controlled on Oxycodone and was discharge home on a short course. He states still feel weak and has some tenderness on upper mid and left abdomen.He denies any fever,chills,nausea,vomiting,contstipation or diarrhea.states has occasional acid reflux but takes prevacid which helps.He denies any blood in the stool.   patient states lower back pain and mid upper back pain rates about  5 - 6 on a scale of 10 and stomach is 2 for level pain.he states did not fill  his prescription for Oxycodone since it was prescribed at the hospital.His Tramadol was discontinued during hospital admission since it was not helping with the pain.He would like tramadol 50 mg tablet three times daily. Narcotic use and Alcohol use overdose risk discussed with patient.  Hyperlipidemia - on atorvastatin 20 mg tablet daily.Latest LDL 104 dietary modification discussed. Medication reconciled.    Past Medical History:  Diagnosis Date   Anxiety    Arthritis    Depression    Diabetes mellitus without complication (HCC)    GERD (gastroesophageal reflux disease)    Heartburn    Hematuria, unspecified    Lumbago    Other abnormal blood chemistry    Other malaise and fatigue    Palpitations    Unspecified vitamin D deficiency    Past Surgical History:  Procedure Laterality Date   SHOULDER ARTHROSCOPY WITH ROTATOR CUFF REPAIR AND SUBACROMIAL DECOMPRESSION Right 12/22/2016   Procedure: Right shoulder arthroscopy, subacromial decompression, mini open rotator cuff repair;  Surgeon: Susa Day, MD;  Location: [facility];  Service: Orthopedics;  Laterality: Right;  120 mins    No Known Allergies  Outpatient Encounter Medications as of 02/26/2019  Medication Sig   acetaminophen (TYLENOL) 500 MG tablet Take 500 mg by mouth as needed.   atorvastatin (LIPITOR) 20 MG tablet Take 1 tablet (20 mg total) by mouth daily.   Multiple Vitamins-Minerals (MULTIVITAMIN PO) Take 1 tablet by mouth daily.   ondansetron (ZOFRAN) 4 MG tablet Take 1 tablet (4 mg total) by mouth daily as needed for nausea or vomiting.   Testosterone 30 MG/ACT SOLN Apply  one pump under the arm once daily   [DISCONTINUED] L-Arginine 500 MG TABS 2 by mouth daily   No facility-administered encounter medications on file as of 02/26/2019.    Review of Systems  Constitutional: Positive for fatigue. Negative for chills and fever.  HENT: Negative for congestion, rhinorrhea, sinus pressure, sinus pain,  sneezing, sore throat and trouble swallowing.   Eyes: Positive for visual disturbance. Negative for discharge, redness and itching.       Wears eye reading glasses   Respiratory: Negative for cough, chest tightness, shortness of breath and wheezing.   Cardiovascular: Negative for chest pain, palpitations and leg swelling.  Gastrointestinal: Negative for abdominal distention, abdominal pain, blood in stool, constipation, diarrhea, nausea and vomiting.  Endocrine: Negative for cold intolerance, heat intolerance, polydipsia, polyphagia and polyuria.  Genitourinary: Negative for difficulty urinating, dysuria, flank pain, hematuria and urgency.       Voids 2-3 times at night.   Musculoskeletal: Positive for arthralgias and back pain. Negative for joint swelling and myalgias.  Skin: Negative for color change, pallor and rash.  Neurological: Negative for dizziness, speech difficulty, weakness, light-headedness, numbness and headaches.  Hematological: Negative for adenopathy. Does not bruise/bleed easily.  Psychiatric/Behavioral: Negative for agitation, behavioral problems, confusion and sleep disturbance. The patient is not nervous/anxious.     Immunization History  Administered Date(s) Administered   Influenza,inj,Quad PF,6+ Mos 12/08/2015   Td 08/22/2007, 12/08/2015   Pertinent  Health Maintenance Due  Topic Date Due   URINE MICROALBUMIN  04/25/2018   INFLUENZA VACCINE  05/22/2019 (Originally 09/22/2018)   Fall Risk  10/18/2018 04/24/2017 01/17/2017 11/14/2016 08/15/2016  Falls in the past year? 0 No No Yes No  Comment - - - pt fell off ladder at home -  Number falls in past yr: 0 - - 1 -  Injury with Fall? 0 - - - -    Vitals:   02/26/19 1334  BP: 128/78  Pulse: 77  Temp: (!) 96.9 F (36.1 C)  TempSrc: Temporal  SpO2: 98%  Weight: 250 lb 6.4 oz (113.6 kg)  Height: '6\' 3"'  (1.905 m)   Body mass index is 31.3 kg/m. Physical Exam Vitals reviewed.  Constitutional:      General: He  is not in acute distress.    Appearance: He is not ill-appearing.  HENT:     Head: Normocephalic.     Right Ear: Tympanic membrane, ear canal and external ear normal. There is no impacted cerumen.     Left Ear: Tympanic membrane, ear canal and external ear normal. There is no impacted cerumen.     Nose: Nose normal. No congestion or rhinorrhea.     Mouth/Throat:     Mouth: Mucous membranes are moist.     Pharynx: Oropharynx is clear. No oropharyngeal exudate or posterior oropharyngeal erythema.  Eyes:     General: No scleral icterus.       Right eye: No discharge.        Left eye: No discharge.     Extraocular Movements: Extraocular movements intact.     Conjunctiva/sclera: Conjunctivae normal.     Pupils: Pupils are equal, round, and reactive to light.  Neck:     Vascular: No carotid bruit.  Cardiovascular:     Rate and Rhythm: Normal rate and regular rhythm.     Pulses: Normal pulses.     Heart sounds: Normal heart sounds. No murmur. No friction rub. No gallop.   Pulmonary:     Effort: Pulmonary effort is  normal. No respiratory distress.     Breath sounds: Normal breath sounds. No wheezing, rhonchi or rales.  Chest:     Chest wall: No tenderness.  Abdominal:     General: Bowel sounds are normal. There is no distension.     Palpations: Abdomen is soft. There is no mass.     Tenderness: There is no right CVA tenderness, left CVA tenderness, guarding or rebound.     Comments: Slight tender to palpation on epigastric and left upper quadrant though reports much improvement compared to recent hospital admission.  Musculoskeletal:        General: No swelling. Normal range of motion.     Cervical back: Normal range of motion. No rigidity or tenderness.     Lumbar back: Normal range of motion. Negative right straight leg raise test and negative left straight leg raise test.     Right lower leg: No edema.     Left lower leg: No edema.     Comments: Lower back  and mid thoracic back  chronic tenderness to palpation   Lymphadenopathy:     Cervical: No cervical adenopathy.  Skin:    General: Skin is warm and dry.     Coloration: Skin is not pale.     Findings: No bruising, erythema or rash.  Neurological:     Mental Status: He is alert and oriented to person, place, and time.     Cranial Nerves: No cranial nerve deficit.     Sensory: No sensory deficit.     Motor: No weakness.     Coordination: Coordination normal.     Gait: Gait normal.  Psychiatric:        Mood and Affect: Mood normal.        Behavior: Behavior normal.        Thought Content: Thought content normal.        Judgment: Judgment normal.    Labs reviewed: Recent Labs    02/14/19 2054 02/15/19 0500 02/18/19 0812  NA 139 136 137  K 3.9 3.9 3.3*  CL 101 101 100  CO2 '27 24 29  ' GLUCOSE 137* 154* 111*  BUN '20 19 13  ' CREATININE 1.00 0.67 0.66  CALCIUM 9.6 9.2 8.6*   Recent Labs    02/14/19 2054 02/15/19 0500 02/18/19 0812  AST '30 27 19  ' ALT 32 28 20  ALKPHOS 94 87 83  BILITOT 0.7 1.0 1.2  PROT 7.7 7.9 6.1*  ALBUMIN 4.4 4.3 2.7*   Recent Labs    10/18/18 1018 10/25/18 0825 02/14/19 2054 02/15/19 0500 02/18/19 0812  WBC 12.4* 7.8 19.4* 17.7* 15.7*  NEUTROABS 9,697* 5,483  --   --   --   HGB 17.4* 16.6 16.0 15.4 12.9*  HCT 52.2* 50.8* 48.4 47.7 40.3  MCV 82.1 80.1 84.0 83.8 84.5  PLT 318 334 253 215 223   Lab Results  Component Value Date   TSH 0.59 08/29/2017   Lab Results  Component Value Date   HGBA1C 5.3 10/18/2018   Lab Results  Component Value Date   CHOL 160 02/15/2019   HDL 43 02/15/2019   LDLCALC 104 (H) 02/15/2019   TRIG 67 02/15/2019   CHOLHDL 3.7 02/15/2019    Significant Diagnostic Results in last 30 days:  CT ABDOMEN PELVIS W CONTRAST  Result Date: 02/15/2019 CLINICAL DATA:  One day of epigastric pain with nausea vomiting, leukocytosis EXAM: CT ABDOMEN AND PELVIS WITH CONTRAST TECHNIQUE: Multidetector CT imaging of the abdomen and pelvis  was  performed using the standard protocol following bolus administration of intravenous contrast. CONTRAST:  178m OMNIPAQUE IOHEXOL 300 MG/ML  SOLN COMPARISON:  CT abdomen pelvis 08/30/2006 FINDINGS: Lower chest: There are dependent atelectatic changes in the lung bases. No consolidation or visible effusion. Normal heart size. No pericardial effusion. Hepatobiliary: No focal liver abnormality is seen. No gallstones, gallbladder wall thickening, or biliary dilatation. Pancreas: Diffusely edematous appearance of the partially fatty replaced pancreas. There is some reactive peripancreatic fluid stranding predominantly along the body and tail of the pancreas. Parenchyma enhances uniformly accounting for more focal fatty infiltration at the pancreatic head seen on comparison study. Spleen: Normal in size without focal abnormality. Adrenals/Urinary Tract: Adrenal glands are unremarkable. Kidneys are normal, without renal calculi, focal lesion, or hydronephrosis. Bladder is unremarkable. Stomach/Bowel: Distal esophagus and stomach are unremarkable. Mild periduodenal stranding is likely secondary to pancreatic inflammation in the absence of frank duodenal wall thickening. No small bowel dilatation or wall thickening. A normal appendix is visualized. No colonic dilatation or wall thickening. Vascular/Lymphatic: The aorta is normal caliber. Upper abdominal reactive adenopathy is seen. No suspicious lymph nodes in the included lymphatic chains. Reproductive: The prostate and seminal vesicles are unremarkable. Other: Edematous change and small volume of peripancreatic fluid about the pancreatic tail without organized collection. No other free fluid or free for air in the abdomen or pelvis. Small fat containing umbilical hernia. No bowel containing hernia. Musculoskeletal: Multilevel degenerative changes are present in the imaged portions of the spine. No acute osseous abnormality or suspicious osseous lesion. IMPRESSION: 1.  Findings most compatible with acute interstitial edematous pancreatitis in the setting of elevated lipase. Fairly uniform enhancement of the pancreatic parenchyma accounting for focal fatty infiltration of the pancreatic head seen on comparison study. Small amount of peripancreatic free fluid towards the pancreatic body and tail without organized collection. 2. Milder paraduodenal stranding is likely secondary to pancreatic inflammation in the absence of frank duodenal wall thickening These results were called by telephone at the time of interpretation on 02/15/2019 at 1:26 am to provider RMontine Circle, who verbally acknowledged these results. Electronically Signed   By: PLovena LeM.D.   On: 02/15/2019 01:26    Assessment/Plan 1. Chronic right-sided low back pain with right-sided sciatica Lower back slight tenderness to palpation.Topical OTC Salon pas recommended.Restart on Tramadol 50 mg tablet one by mouth three times daily as needed.Narcotic uses overdose risk discussed with patient verbalized understanding.   - traMADol (ULTRAM) 50 MG tablet; Take 1 tablet (50 mg total) by mouth 3 (three) times daily as needed.  Dispense: 90 tablet; Refill: 0 - Lipase - Pain Mgmt, Profile 1 w/o Conf, U  2. Chronic pain syndrome Narcotic use and overdose risk discussed with patient.Narcotic uses pain contract updated and signed this visit. - traMADol (ULTRAM) 50 MG tablet; Take 1 tablet (50 mg total) by mouth 3 (three) times daily as needed.  Dispense: 90 tablet; Refill: 0 - Pain Mgmt, Profile 1 w/o Conf, U  3. Thoracic spine pain Mid-upper back tender to palpation. Continue on tramadol as above.  - traMADol (ULTRAM) 50 MG tablet; Take 1 tablet (50 mg total) by mouth 3 (three) times daily as needed.  Dispense: 90 tablet; Refill: 0 - Pain Mgmt, Profile 1 w/o Conf, U  4. Acute pancreatitis, unspecified complication status, unspecified pancreatitis type Afebrile.Status post hospital admission for acute  pancreatitis.gastric and LUQ abdominal tender slightly tender to palpation though much improvement per patient compared to during recent hospital admission.will repeat  labs. Alcohol cessation advised states does not drink much.  - CBC with Differential/Platelet - CMP with eGFR([facility]) - Lipase  5. Mixed hyperlipidemia Latest LDL not at goal.Continue on atorvastatin 20 mg tablet daily.  Family/ staff Communication: Reviewed plan of care with patient.  Labs/tests ordered:  - CBC with Differential/Platelet - CMP with eGFR([facility]) - Lipase - Pain Mgmt, Profile 1 w/o Conf, U  Next Appointment: states will check his schedule then call office to schedule for routine follow up appointment with PCP Sherrie Mustache, NP  Sandrea Hughs, NP

## 2019-02-27 LAB — CBC WITH DIFFERENTIAL/PLATELET
Absolute Monocytes: 670 cells/uL (ref 200–950)
Basophils Absolute: 121 cells/uL (ref 0–200)
Basophils Relative: 0.9 %
Eosinophils Absolute: 241 cells/uL (ref 15–500)
Eosinophils Relative: 1.8 %
HCT: 48.2 % (ref 38.5–50.0)
Hemoglobin: 15.9 g/dL (ref 13.2–17.1)
Lymphs Abs: 2613 cells/uL (ref 850–3900)
MCH: 27.1 pg (ref 27.0–33.0)
MCHC: 33 g/dL (ref 32.0–36.0)
MCV: 82.1 fL (ref 80.0–100.0)
MPV: 8.6 fL (ref 7.5–12.5)
Monocytes Relative: 5 %
Neutro Abs: 9755 cells/uL — ABNORMAL HIGH (ref 1500–7800)
Neutrophils Relative %: 72.8 %
Platelets: 438 10*3/uL — ABNORMAL HIGH (ref 140–400)
RBC: 5.87 10*6/uL — ABNORMAL HIGH (ref 4.20–5.80)
RDW: 12.6 % (ref 11.0–15.0)
Total Lymphocyte: 19.5 %
WBC: 13.4 10*3/uL — ABNORMAL HIGH (ref 3.8–10.8)

## 2019-02-27 LAB — COMPLETE METABOLIC PANEL WITH GFR
AG Ratio: 1.3 (calc) (ref 1.0–2.5)
ALT: 77 U/L — ABNORMAL HIGH (ref 9–46)
AST: 24 U/L (ref 10–40)
Albumin: 4 g/dL (ref 3.6–5.1)
Alkaline phosphatase (APISO): 116 U/L (ref 36–130)
BUN: 14 mg/dL (ref 7–25)
CO2: 29 mmol/L (ref 20–32)
Calcium: 10.6 mg/dL — ABNORMAL HIGH (ref 8.6–10.3)
Chloride: 99 mmol/L (ref 98–110)
Creat: 0.83 mg/dL (ref 0.60–1.35)
GFR, Est African American: 121 mL/min/{1.73_m2} (ref >=60)
GFR, Est Non African American: 104 mL/min/{1.73_m2} (ref >=60)
Globulin: 3.2 g/dL (calc) (ref 1.9–3.7)
Glucose, Bld: 86 mg/dL (ref 65–139)
Potassium: 4.4 mmol/L (ref 3.5–5.3)
Sodium: 139 mmol/L (ref 135–146)
Total Bilirubin: 0.3 mg/dL (ref 0.2–1.2)
Total Protein: 7.2 g/dL (ref 6.1–8.1)

## 2019-02-27 LAB — PAIN MGMT, PROFILE 1 W/O CONF, U
Amphetamines: NEGATIVE ng/mL
Barbiturates: NEGATIVE ng/mL
Benzodiazepines: NEGATIVE ng/mL
Cocaine Metabolite: NEGATIVE ng/mL
Creatinine: 135.3 mg/dL
Marijuana Metabolite: NEGATIVE ng/mL
Methadone Metabolite: NEGATIVE ng/mL
Opiates: NEGATIVE ng/mL
Oxidant: NEGATIVE ug/mL
Oxycodone: NEGATIVE ng/mL
Phencyclidine: NEGATIVE ng/mL
pH: 7 (ref 4.5–9.0)

## 2019-02-27 LAB — LIPASE: Lipase: 23 U/L (ref 7–60)

## 2019-02-27 NOTE — Addendum Note (Signed)
Addended byRicharda Blade C on: 02/27/2019 04:24 PM   Modules accepted: Level of Service

## 2019-03-01 ENCOUNTER — Telehealth: Payer: Self-pay

## 2019-03-01 DIAGNOSIS — D72829 Elevated white blood cell count, unspecified: Secondary | ICD-10-CM

## 2019-03-01 NOTE — Telephone Encounter (Signed)
Patient was contacted and labs were discussed. Patient stated that he's not taking calcium and stated that he would avoid alcohol and tylenol. Patient agrees to come into office for repeat labs on 03/14/2018 @8 :30am. Appointment scheduled.

## 2019-03-01 NOTE — Telephone Encounter (Signed)
-----   Message from Caesar Bookman, NP sent at 02/28/2019  5:11 PM EST ----- 1. CBC indicates high WBC though this is trending down compared to recent levels in the hospital.Repeat CBC/diff in 2 weeks follow up leukocytosis. 2.calcium level slightly high are you taking any calcium? If so will need to discontinue.liver function ALT high.Repeat CMP in 2 weeks. Avoid tylenol and Alcohol

## 2019-03-15 ENCOUNTER — Other Ambulatory Visit: Payer: Self-pay

## 2019-03-27 ENCOUNTER — Other Ambulatory Visit: Payer: Self-pay | Admitting: *Deleted

## 2019-03-27 DIAGNOSIS — M546 Pain in thoracic spine: Secondary | ICD-10-CM

## 2019-03-27 DIAGNOSIS — R7989 Other specified abnormal findings of blood chemistry: Secondary | ICD-10-CM

## 2019-03-27 DIAGNOSIS — G894 Chronic pain syndrome: Secondary | ICD-10-CM

## 2019-03-27 DIAGNOSIS — G8929 Other chronic pain: Secondary | ICD-10-CM

## 2019-03-27 MED ORDER — TESTOSTERONE 30 MG/ACT TD SOLN: TRANSDERMAL | 0 refills | Status: DC

## 2019-03-27 MED ORDER — TRAMADOL HCL 50 MG PO TABS: 50.0000 mg | ORAL_TABLET | Freq: Three times a day (TID) | ORAL | 0 refills | Status: DC | PRN

## 2019-03-27 NOTE — Telephone Encounter (Signed)
Patient requested refill NCCSRS Database Requested LR: 02/26/2019 Pended Rx and sent to [facility] for approval.

## 2019-03-27 NOTE — Telephone Encounter (Signed)
Medication refused by [name] until patient made appointment.  Appointment scheduled by patient Lab: 04/04/19 Jessica: 04/05/19  Pended Rx's and sent to [facility] for approval.

## 2019-03-27 NOTE — Addendum Note (Signed)
Addended by: Nelda Severe A on: 03/27/2019 09:30 AM   Modules accepted: Orders

## 2019-04-04 ENCOUNTER — Other Ambulatory Visit: Payer: Self-pay

## 2019-04-05 ENCOUNTER — Ambulatory Visit: Payer: Self-pay | Admitting: Nurse Practitioner

## 2019-04-16 ENCOUNTER — Other Ambulatory Visit: Payer: BC Managed Care – PPO

## 2019-04-16 ENCOUNTER — Other Ambulatory Visit: Payer: Self-pay

## 2019-04-16 LAB — COMPLETE METABOLIC PANEL WITH GFR
AG Ratio: 1.5 (calc) (ref 1.0–2.5)
ALT: 29 U/L (ref 9–46)
AST: 23 U/L (ref 10–40)
Albumin: 4.4 g/dL (ref 3.6–5.1)
Alkaline phosphatase (APISO): 95 U/L (ref 36–130)
BUN: 14 mg/dL (ref 7–25)
CO2: 24 mmol/L (ref 20–32)
Calcium: 9.8 mg/dL (ref 8.6–10.3)
Chloride: 104 mmol/L (ref 98–110)
Creat: 0.83 mg/dL (ref 0.60–1.35)
GFR, Est African American: 121 mL/min/{1.73_m2} (ref >=60)
GFR, Est Non African American: 104 mL/min/{1.73_m2} (ref >=60)
Globulin: 2.9 g/dL (calc) (ref 1.9–3.7)
Glucose, Bld: 95 mg/dL (ref 65–99)
Potassium: 4.2 mmol/L (ref 3.5–5.3)
Sodium: 138 mmol/L (ref 135–146)
Total Bilirubin: 0.6 mg/dL (ref 0.2–1.2)
Total Protein: 7.3 g/dL (ref 6.1–8.1)

## 2019-04-16 LAB — CBC WITH DIFFERENTIAL/PLATELET
Absolute Monocytes: 479 cells/uL (ref 200–950)
Basophils Absolute: 34 cells/uL (ref 0–200)
Basophils Relative: 0.4 %
Eosinophils Absolute: 67 cells/uL (ref 15–500)
Eosinophils Relative: 0.8 %
HCT: 48.4 % (ref 38.5–50.0)
Hemoglobin: 16.5 g/dL (ref 13.2–17.1)
Lymphs Abs: 1747 cells/uL (ref 850–3900)
MCH: 27.7 pg (ref 27.0–33.0)
MCHC: 34.1 g/dL (ref 32.0–36.0)
MCV: 81.2 fL (ref 80.0–100.0)
MPV: 9.3 fL (ref 7.5–12.5)
Monocytes Relative: 5.7 %
Neutro Abs: 6073 cells/uL (ref 1500–7800)
Neutrophils Relative %: 72.3 %
Platelets: 270 10*3/uL (ref 140–400)
RBC: 5.96 10*6/uL — ABNORMAL HIGH (ref 4.20–5.80)
RDW: 13 % (ref 11.0–15.0)
Total Lymphocyte: 20.8 %
WBC: 8.4 10*3/uL (ref 3.8–10.8)

## 2019-04-18 ENCOUNTER — Ambulatory Visit: Payer: BC Managed Care – PPO | Admitting: Nurse Practitioner

## 2019-04-22 ENCOUNTER — Telehealth (INDEPENDENT_AMBULATORY_CARE_PROVIDER_SITE_OTHER): Payer: BC Managed Care – PPO | Admitting: Nurse Practitioner

## 2019-04-22 ENCOUNTER — Other Ambulatory Visit: Payer: Self-pay

## 2019-04-22 ENCOUNTER — Encounter: Payer: BC Managed Care – PPO | Admitting: Nurse Practitioner

## 2019-04-22 ENCOUNTER — Encounter: Payer: Self-pay | Admitting: Nurse Practitioner

## 2019-04-22 DIAGNOSIS — F411 Generalized anxiety disorder: Secondary | ICD-10-CM | POA: Diagnosis not present

## 2019-04-22 DIAGNOSIS — E782 Mixed hyperlipidemia: Secondary | ICD-10-CM | POA: Diagnosis not present

## 2019-04-22 DIAGNOSIS — G8929 Other chronic pain: Secondary | ICD-10-CM

## 2019-04-22 DIAGNOSIS — R7989 Other specified abnormal findings of blood chemistry: Secondary | ICD-10-CM | POA: Diagnosis not present

## 2019-04-22 DIAGNOSIS — M545 Low back pain, unspecified: Secondary | ICD-10-CM

## 2019-04-22 DIAGNOSIS — K2100 Gastro-esophageal reflux disease with esophagitis, without bleeding: Secondary | ICD-10-CM

## 2019-04-22 NOTE — Progress Notes (Signed)
This service is provided via telemedicine  No vital signs collected/recorded due to the encounter was a telemedicine visit.   Location of patient (ex: home, work):  [employer], Office   Patient consents to a telephone visit:  Yes  Location of the provider (ex: office, home):  [employer], Office   Name of any referring provider:  N/A  Names of all persons participating in the telemedicine service and their role in the encounter:  [employer], Office   Time spent on call:  6 min with medical assistant     Careteam: Patient Care Team: Sharon Seller, NP as PCP - General (Geriatric Medicine) Sheran Luz, MD as Consulting Physician (Physical Medicine and Rehabilitation) Pati Gallo, MD as Consulting Physician (Sports Medicine)  Advanced Directive information Does Patient Have a Medical Advance Directive?: No, Would patient like information on creating a medical advance directive?: Yes (MAU/Ambulatory/Procedural Areas - Information given)  No Known Allergies  Chief Complaint  Patient presents with  . Medical Management of Chronic Issues    6 month follow-up and discuss labs, Telehealth/Video Visit  . Quality Metric Gaps    Discuss MALB recommendation      HPI: Patient is a 49 y.o. male for routine follow up via virtual visit.   Recently hospitalized due to acute pancreatitis- he does not drink heavy and did not have gallstones. Occasional tenderness after hospitalization but nothing recently.  Greasy and spicy foods would make it worse.   Low testosterone - continues on testosterone, uses occasionally  Hyperlipidemia- continues on lipitor, LDL elevated in December but does not make dietary modification.   Low back pain- uses tramadol and tylenol to supplement. Pain controlled on current regimen.   GERD- diet controlled.   Review of Systems:  Review of Systems  Constitutional: Negative for chills, fever and weight loss.    Respiratory: Negative for cough, sputum production and shortness of breath.   Cardiovascular: Negative for chest pain, palpitations and leg swelling.  Gastrointestinal: Negative for abdominal pain, constipation, diarrhea and heartburn.  Genitourinary: Negative for dysuria, frequency and urgency.  Musculoskeletal: Positive for back pain. Negative for joint pain and myalgias.  Skin: Negative.   Neurological: Negative for dizziness and headaches.  Psychiatric/Behavioral: Negative for depression. The patient does not have insomnia.     Past Medical History:  Diagnosis Date  . Anxiety   . Arthritis   . Depression   . Diabetes mellitus without complication (HCC)   . GERD (gastroesophageal reflux disease)   . Heartburn   . Hematuria, unspecified   . Lumbago   . Other abnormal blood chemistry   . Other malaise and fatigue   . Palpitations   . Unspecified vitamin D deficiency    Past Surgical History:  Procedure Laterality Date  . SHOULDER ARTHROSCOPY WITH ROTATOR CUFF REPAIR AND SUBACROMIAL DECOMPRESSION Right 12/22/2016   Procedure: Right shoulder arthroscopy, subacromial decompression, mini open rotator cuff repair;  Surgeon: Jene Every, MD;  Location: [facility];  Service: Orthopedics;  Laterality: Right;  120 mins   Social History:   reports that he quit smoking about 2 years ago. His smoking use included cigarettes. He has a 15.00 pack-year smoking history. He has never used smokeless tobacco. He reports current alcohol use. He reports that he does not use drugs.  History reviewed. No pertinent family history.  Medications: Patient's Medications  New Prescriptions   No medications on file  Previous Medications   ACETAMINOPHEN (TYLENOL) 500 MG TABLET  Take 500 mg by mouth as needed.   ATORVASTATIN (LIPITOR) 20 MG TABLET    Take 1 tablet (20 mg total) by mouth daily.   TESTOSTERONE 30 MG/ACT SOLN    Apply one pump under the arm once daily   TRAMADOL (ULTRAM) 50 MG TABLET     Take 1 tablet (50 mg total) by mouth 3 (three) times daily as needed.  Modified Medications   No medications on file  Discontinued Medications   MULTIPLE VITAMINS-MINERALS (MULTIVITAMIN PO)    Take 1 tablet by mouth daily.   ONDANSETRON (ZOFRAN) 4 MG TABLET    Take 1 tablet (4 mg total) by mouth daily as needed for nausea or vomiting.    Physical Exam:  There were no vitals filed for this visit. There is no height or weight on file to calculate BMI. Wt Readings from Last 3 Encounters:  02/26/19 250 lb 6.4 oz (113.6 kg)  02/20/19 256 lb 9.9 oz (116.4 kg)  10/18/18 260 lb (117.9 kg)      Labs reviewed: Basic Metabolic Panel: Recent Labs    02/18/19 0812 02/26/19 1420 04/16/19 0914  NA 137 139 138  K 3.3* 4.4 4.2  CL 100 99 104  CO2 29 29 24   GLUCOSE 111* 86 95  BUN 13 14 14   CREATININE 0.66 0.83 0.83  CALCIUM 8.6* 10.6* 9.8   Liver Function Tests: Recent Labs    02/14/19 2054 02/14/19 2054 02/15/19 0500 02/15/19 0500 02/18/19 0812 02/26/19 1420 04/16/19 0914  AST 30   < > 27   < > 19 24 23   ALT 32   < > 28   < > 20 77* 29  ALKPHOS 94  --  87  --  83  --   --   BILITOT 0.7   < > 1.0   < > 1.2 0.3 0.6  PROT 7.7   < > 7.9   < > 6.1* 7.2 7.3  ALBUMIN 4.4  --  4.3  --  2.7*  --   --    < > = values in this interval not displayed.   Recent Labs    02/15/19 0500 02/18/19 0812 02/26/19 1420  LIPASE 955* 20 23   No results for input(s): AMMONIA in the last 8760 hours. CBC: Recent Labs    10/25/18 0825 02/14/19 2054 02/18/19 0812 02/26/19 1420 04/16/19 0914  WBC 7.8   < > 15.7* 13.4* 8.4  NEUTROABS 5,483  --   --  9,755* 6,073  HGB 16.6   < > 12.9* 15.9 16.5  HCT 50.8*   < > 40.3 48.2 48.4  MCV 80.1   < > 84.5 82.1 81.2  PLT 334   < > 223 438* 270   < > = values in this interval not displayed.   Lipid Panel: Recent Labs    10/18/18 1018 02/15/19 0127 02/15/19 0500  CHOL 153  --  160  HDL 44  --  43  LDLCALC 88  --  104*  TRIG 118 84 67    CHOLHDL 3.5  --  3.7   TSH: No results for input(s): TSH in the last 8760 hours. A1C: Lab Results  Component Value Date   HGBA1C 5.3 10/18/2018     Assessment/Plan 1. Low testosterone -continues on testosterone replacement therapy, does not take daily  2. Chronic left-sided low back pain, unspecified whether sciatica present -stable on tramadol. Uses tylenol to supplement for increase pain.  3. Anxiety state Controlled. Does  not take medication for this at this time.   4. Moderate mixed hyperlipidemia not requiring statin therapy -encouraged dietary modifications, continues on Lipitor 20 mg daily   5. Gastroesophageal reflux disease with esophagitis without hemorrhage Stable, diet controlled.   Next appt: 6 months.  Janene Harvey. Biagio Borg  [facility] [phone]   Virtual Visit via Video Note  I connected with [name] on 04/22/19 at  3:45 PM EST by a video enabled telemedicine application and verified that I am speaking with the correct person using two identifiers.  Location: Patient: parkinglot Provider: office   I discussed the limitations of evaluation and management by telemedicine and the availability of in person appointments. The patient expressed understanding and agreed to proceed.    I discussed the assessment and treatment plan with the patient. The patient was provided an opportunity to ask questions and all were answered. The patient agreed with the plan and demonstrated an understanding of the instructions.   The patient was advised to call back or seek an in-person evaluation if the symptoms worsen or if the condition fails to improve as anticipated.  I provided 15 minutes of non-face-to-face time during this encounter.  Janene Harvey. Janyth Contes, AGNP Avs printed and mailed.

## 2019-04-22 NOTE — Progress Notes (Signed)
err

## 2019-04-23 ENCOUNTER — Other Ambulatory Visit: Payer: Self-pay | Admitting: Family

## 2019-04-23 DIAGNOSIS — M546 Pain in thoracic spine: Secondary | ICD-10-CM

## 2019-04-23 DIAGNOSIS — G8929 Other chronic pain: Secondary | ICD-10-CM

## 2019-04-23 DIAGNOSIS — G894 Chronic pain syndrome: Secondary | ICD-10-CM

## 2019-04-23 MED ORDER — ATORVASTATIN CALCIUM 20 MG PO TABS: 20.0000 mg | ORAL_TABLET | Freq: Every day | ORAL | 0 refills | Status: DC

## 2019-04-23 NOTE — Telephone Encounter (Signed)
RX last filled 03/27/19  Opioid treatment agreement on file from January 2021 .    Patient also left voicemail on clinical intake requesting a refill on statin. Last lipid check 10/18/2018

## 2019-05-22 ENCOUNTER — Other Ambulatory Visit: Payer: Self-pay | Admitting: *Deleted

## 2019-05-22 DIAGNOSIS — G894 Chronic pain syndrome: Secondary | ICD-10-CM

## 2019-05-22 DIAGNOSIS — G8929 Other chronic pain: Secondary | ICD-10-CM

## 2019-05-22 DIAGNOSIS — M546 Pain in thoracic spine: Secondary | ICD-10-CM

## 2019-05-22 NOTE — Telephone Encounter (Signed)
Patient requested refill for Tramadol NCCSRS Database Verified LR: 04/24/2019 Contract updated.  Pended Rx and sent to [facility] for approval.

## 2019-05-23 ENCOUNTER — Other Ambulatory Visit: Payer: Self-pay | Admitting: Nurse Practitioner

## 2019-05-23 DIAGNOSIS — G8929 Other chronic pain: Secondary | ICD-10-CM

## 2019-05-23 DIAGNOSIS — M546 Pain in thoracic spine: Secondary | ICD-10-CM

## 2019-05-23 DIAGNOSIS — G894 Chronic pain syndrome: Secondary | ICD-10-CM

## 2019-05-23 MED ORDER — TRAMADOL HCL 50 MG PO TABS: 50.0000 mg | ORAL_TABLET | Freq: Three times a day (TID) | ORAL | 0 refills | Status: DC | PRN

## 2019-06-19 ENCOUNTER — Other Ambulatory Visit: Payer: Self-pay | Admitting: *Deleted

## 2019-06-19 DIAGNOSIS — M5441 Lumbago with sciatica, right side: Secondary | ICD-10-CM

## 2019-06-19 DIAGNOSIS — G8929 Other chronic pain: Secondary | ICD-10-CM

## 2019-06-19 DIAGNOSIS — M546 Pain in thoracic spine: Secondary | ICD-10-CM

## 2019-06-19 DIAGNOSIS — G894 Chronic pain syndrome: Secondary | ICD-10-CM

## 2019-06-19 MED ORDER — ATORVASTATIN CALCIUM 20 MG PO TABS: 20.0000 mg | ORAL_TABLET | Freq: Every day | ORAL | 0 refills | Status: DC

## 2019-06-19 NOTE — Telephone Encounter (Signed)
Patient requested refill NCCSRS Database Verified LR: 05/23/2019 Pended Rx and sent to [facility] for approval.

## 2019-06-21 ENCOUNTER — Other Ambulatory Visit: Payer: Self-pay | Admitting: Nurse Practitioner

## 2019-06-21 DIAGNOSIS — G894 Chronic pain syndrome: Secondary | ICD-10-CM

## 2019-06-21 DIAGNOSIS — G8929 Other chronic pain: Secondary | ICD-10-CM

## 2019-06-21 DIAGNOSIS — M546 Pain in thoracic spine: Secondary | ICD-10-CM

## 2019-06-21 MED ORDER — TRAMADOL HCL 50 MG PO TABS: 50.0000 mg | ORAL_TABLET | Freq: Three times a day (TID) | ORAL | 0 refills | Status: DC | PRN

## 2019-06-21 NOTE — Telephone Encounter (Signed)
Patient called requesting refill.  Pended and sent to [facility] for approval.

## 2019-07-18 ENCOUNTER — Other Ambulatory Visit: Payer: Self-pay | Admitting: *Deleted

## 2019-07-18 DIAGNOSIS — M546 Pain in thoracic spine: Secondary | ICD-10-CM

## 2019-07-18 DIAGNOSIS — G894 Chronic pain syndrome: Secondary | ICD-10-CM

## 2019-07-18 DIAGNOSIS — G8929 Other chronic pain: Secondary | ICD-10-CM

## 2019-07-18 MED ORDER — TRAMADOL HCL 50 MG PO TABS: 50.0000 mg | ORAL_TABLET | Freq: Three times a day (TID) | ORAL | 0 refills | Status: DC | PRN

## 2019-07-18 NOTE — Telephone Encounter (Signed)
Patient Requested refill [facility] Database Verified LR: 06/21/2019 Contract updated.  Pended Rx and sent to Dr. Renato Gails for approval due to [name] out of office.

## 2019-08-15 ENCOUNTER — Other Ambulatory Visit: Payer: Self-pay | Admitting: *Deleted

## 2019-08-15 ENCOUNTER — Other Ambulatory Visit: Payer: Self-pay | Admitting: Nurse Practitioner

## 2019-08-15 DIAGNOSIS — M5441 Lumbago with sciatica, right side: Secondary | ICD-10-CM

## 2019-08-15 DIAGNOSIS — G894 Chronic pain syndrome: Secondary | ICD-10-CM

## 2019-08-15 DIAGNOSIS — R7989 Other specified abnormal findings of blood chemistry: Secondary | ICD-10-CM

## 2019-08-15 DIAGNOSIS — M546 Pain in thoracic spine: Secondary | ICD-10-CM

## 2019-08-15 NOTE — Telephone Encounter (Signed)
Patient called requesting refill Pended Rx's and sent to [facility] for approval.  Testosterone Epic LR: 2/3 Tramadol Epic LR: 5/27

## 2019-08-16 MED ORDER — TRAMADOL HCL 50 MG PO TABS: 50.0000 mg | ORAL_TABLET | Freq: Three times a day (TID) | ORAL | 0 refills | Status: DC | PRN

## 2019-08-16 MED ORDER — TESTOSTERONE 30 MG/ACT TD SOLN: TRANSDERMAL | 0 refills | Status: DC

## 2019-09-13 ENCOUNTER — Other Ambulatory Visit: Payer: Self-pay | Admitting: *Deleted

## 2019-09-13 ENCOUNTER — Other Ambulatory Visit: Payer: Self-pay | Admitting: Nurse Practitioner

## 2019-09-13 DIAGNOSIS — G8929 Other chronic pain: Secondary | ICD-10-CM

## 2019-09-13 DIAGNOSIS — G894 Chronic pain syndrome: Secondary | ICD-10-CM

## 2019-09-13 DIAGNOSIS — M546 Pain in thoracic spine: Secondary | ICD-10-CM

## 2019-09-13 MED ORDER — TRAMADOL HCL 50 MG PO TABS: 50.0000 mg | ORAL_TABLET | Freq: Three times a day (TID) | ORAL | 0 refills | Status: DC | PRN

## 2019-09-13 NOTE — Telephone Encounter (Signed)
Patient requested refill Epic LR: 08/16/2019 Pended Rx and sent to [facility] for approval.

## 2019-09-13 NOTE — Telephone Encounter (Signed)
Patient calling requesting refill

## 2019-10-11 ENCOUNTER — Other Ambulatory Visit: Payer: Self-pay | Admitting: *Deleted

## 2019-10-11 ENCOUNTER — Other Ambulatory Visit: Payer: Self-pay | Admitting: Nurse Practitioner

## 2019-10-11 DIAGNOSIS — G894 Chronic pain syndrome: Secondary | ICD-10-CM

## 2019-10-11 DIAGNOSIS — G8929 Other chronic pain: Secondary | ICD-10-CM

## 2019-10-11 DIAGNOSIS — R7989 Other specified abnormal findings of blood chemistry: Secondary | ICD-10-CM

## 2019-10-11 DIAGNOSIS — M5441 Lumbago with sciatica, right side: Secondary | ICD-10-CM

## 2019-10-11 DIAGNOSIS — M546 Pain in thoracic spine: Secondary | ICD-10-CM

## 2019-10-11 MED ORDER — TESTOSTERONE 30 MG/ACT TD SOLN: TRANSDERMAL | 0 refills | Status: DC

## 2019-10-11 NOTE — Telephone Encounter (Signed)
Treatment agreement on file from 02/26/2019  RX last filled in Epic on 09/13/2019

## 2019-10-11 NOTE — Telephone Encounter (Signed)
Patient requested refill Epic LR:  Tramadol 09/13/19 Testosterone 08/16/19 Contract updated Pended Rx and sent to [facility] for approval.

## 2019-11-02 ENCOUNTER — Other Ambulatory Visit: Payer: Self-pay

## 2019-11-02 ENCOUNTER — Emergency Department (HOSPITAL_COMMUNITY): Payer: BC Managed Care – PPO

## 2019-11-02 ENCOUNTER — Inpatient Hospital Stay (HOSPITAL_COMMUNITY): Payer: BC Managed Care – PPO

## 2019-11-02 ENCOUNTER — Encounter (HOSPITAL_COMMUNITY): Payer: Self-pay | Admitting: Emergency Medicine

## 2019-11-02 ENCOUNTER — Inpatient Hospital Stay (HOSPITAL_COMMUNITY)
Admission: EM | Admit: 2019-11-02 | Discharge: 2019-11-06 | DRG: 439 | Disposition: A | Discharge status: Home / Self Care | Payer: BC Managed Care – PPO | Attending: Student | Admitting: Student

## 2019-11-02 DIAGNOSIS — Z20822 Contact with and (suspected) exposure to covid-19: Secondary | ICD-10-CM | POA: Diagnosis present

## 2019-11-02 DIAGNOSIS — D72829 Elevated white blood cell count, unspecified: Secondary | ICD-10-CM

## 2019-11-02 DIAGNOSIS — G8929 Other chronic pain: Secondary | ICD-10-CM | POA: Diagnosis present

## 2019-11-02 DIAGNOSIS — M545 Low back pain, unspecified: Secondary | ICD-10-CM | POA: Diagnosis present

## 2019-11-02 DIAGNOSIS — Z87891 Personal history of nicotine dependence: Secondary | ICD-10-CM | POA: Diagnosis not present

## 2019-11-02 DIAGNOSIS — I471 Supraventricular tachycardia: Secondary | ICD-10-CM | POA: Diagnosis present

## 2019-11-02 DIAGNOSIS — K859 Acute pancreatitis without necrosis or infection, unspecified: Secondary | ICD-10-CM | POA: Diagnosis present

## 2019-11-02 DIAGNOSIS — E785 Hyperlipidemia, unspecified: Secondary | ICD-10-CM

## 2019-11-02 DIAGNOSIS — K219 Gastro-esophageal reflux disease without esophagitis: Secondary | ICD-10-CM | POA: Diagnosis present

## 2019-11-02 DIAGNOSIS — I10 Essential (primary) hypertension: Secondary | ICD-10-CM | POA: Diagnosis present

## 2019-11-02 DIAGNOSIS — K852 Alcohol induced acute pancreatitis without necrosis or infection: Principal | ICD-10-CM | POA: Diagnosis present

## 2019-11-02 DIAGNOSIS — E119 Type 2 diabetes mellitus without complications: Secondary | ICD-10-CM | POA: Diagnosis present

## 2019-11-02 DIAGNOSIS — Z79899 Other long term (current) drug therapy: Secondary | ICD-10-CM | POA: Diagnosis not present

## 2019-11-02 DIAGNOSIS — E876 Hypokalemia: Secondary | ICD-10-CM | POA: Diagnosis present

## 2019-11-02 DIAGNOSIS — D72825 Bandemia: Secondary | ICD-10-CM | POA: Diagnosis not present

## 2019-11-02 DIAGNOSIS — K2100 Gastro-esophageal reflux disease with esophagitis, without bleeding: Secondary | ICD-10-CM | POA: Diagnosis not present

## 2019-11-02 DIAGNOSIS — K8500 Idiopathic acute pancreatitis without necrosis or infection: Secondary | ICD-10-CM | POA: Diagnosis not present

## 2019-11-02 DIAGNOSIS — G894 Chronic pain syndrome: Secondary | ICD-10-CM | POA: Diagnosis not present

## 2019-11-02 DIAGNOSIS — F329 Major depressive disorder, single episode, unspecified: Secondary | ICD-10-CM | POA: Diagnosis present

## 2019-11-02 DIAGNOSIS — E291 Testicular hypofunction: Secondary | ICD-10-CM | POA: Diagnosis not present

## 2019-11-02 DIAGNOSIS — R933 Abnormal findings on diagnostic imaging of other parts of digestive tract: Secondary | ICD-10-CM

## 2019-11-02 LAB — COMPREHENSIVE METABOLIC PANEL
ALT: 27 U/L (ref 0–44)
AST: 21 U/L (ref 15–41)
Albumin: 4.2 g/dL (ref 3.5–5.0)
Alkaline Phosphatase: 91 U/L (ref 38–126)
Anion gap: 13 (ref 5–15)
BUN: 15 mg/dL (ref 6–20)
CO2: 25 mmol/L (ref 22–32)
Calcium: 9.8 mg/dL (ref 8.9–10.3)
Chloride: 98 mmol/L (ref 98–111)
Creatinine, Ser: 0.73 mg/dL (ref 0.61–1.24)
GFR calc Af Amer: >60 mL/min (ref >60)
GFR calc non Af Amer: >60 mL/min (ref >60)
Glucose, Bld: 119 mg/dL — ABNORMAL HIGH (ref 70–99)
Potassium: 4 mmol/L (ref 3.5–5.1)
Sodium: 136 mmol/L (ref 135–145)
Total Bilirubin: 1.3 mg/dL — ABNORMAL HIGH (ref 0.3–1.2)
Total Protein: 8.1 g/dL (ref 6.5–8.1)

## 2019-11-02 LAB — CBC
HCT: 49.1 % (ref 39.0–52.0)
Hemoglobin: 16.6 g/dL (ref 13.0–17.0)
MCH: 27.7 pg (ref 26.0–34.0)
MCHC: 33.8 g/dL (ref 30.0–36.0)
MCV: 82 fL (ref 80.0–100.0)
Platelets: 248 10*3/uL (ref 150–400)
RBC: 5.99 MIL/uL — ABNORMAL HIGH (ref 4.22–5.81)
RDW: 12.6 % (ref 11.5–15.5)
WBC: 19.9 10*3/uL — ABNORMAL HIGH (ref 4.0–10.5)
nRBC: 0 % (ref 0.0–0.2)

## 2019-11-02 LAB — SARS CORONAVIRUS 2 BY RT PCR (HOSPITAL ORDER, PERFORMED IN ~~LOC~~ HOSPITAL LAB): SARS Coronavirus 2: NEGATIVE

## 2019-11-02 LAB — LIPASE, BLOOD: Lipase: 144 U/L — ABNORMAL HIGH (ref 11–51)

## 2019-11-02 MED ORDER — HYDROMORPHONE HCL 1 MG/ML IJ SOLN
0.5000 mg | INTRAMUSCULAR | Status: DC | PRN
  Administered 2019-11-02 – 2019-11-06 (×33): 1 mg via INTRAVENOUS
  Filled 2019-11-02 (×33): qty 1

## 2019-11-02 MED ORDER — MORPHINE SULFATE (PF) 4 MG/ML IV SOLN: 4.0000 mg | Freq: Once | INTRAVENOUS | Status: DC

## 2019-11-02 MED ORDER — HYDROMORPHONE HCL 1 MG/ML IJ SOLN
1.0000 mg | Freq: Once | INTRAMUSCULAR | Status: AC
  Administered 2019-11-02: 1 mg via INTRAVENOUS
  Filled 2019-11-02: qty 1

## 2019-11-02 MED ORDER — ACETAMINOPHEN 325 MG PO TABS
650.0000 mg | ORAL_TABLET | Freq: Four times a day (QID) | ORAL | Status: DC | PRN
  Administered 2019-11-02 – 2019-11-05 (×4): 650 mg via ORAL
  Filled 2019-11-02 (×4): qty 2

## 2019-11-02 MED ORDER — SODIUM CHLORIDE 0.9 % IV BOLUS
1000.0000 mL | Freq: Once | INTRAVENOUS | Status: AC
  Administered 2019-11-02: 1000 mL via INTRAVENOUS

## 2019-11-02 MED ORDER — LACTATED RINGERS IV SOLN: INTRAVENOUS | Status: AC

## 2019-11-02 MED ORDER — TESTOSTERONE 50 MG/5GM (1%) TD GEL: 50.0000 g | Freq: Every day | TRANSDERMAL | Status: DC

## 2019-11-02 MED ORDER — HYDROMORPHONE HCL 1 MG/ML IJ SOLN
0.5000 mg | Freq: Once | INTRAMUSCULAR | Status: AC
  Administered 2019-11-02: 0.5 mg via INTRAVENOUS
  Filled 2019-11-02: qty 1

## 2019-11-02 MED ORDER — ONDANSETRON HCL 4 MG PO TABS
4.0000 mg | ORAL_TABLET | Freq: Four times a day (QID) | ORAL | Status: DC | PRN
  Administered 2019-11-06: 4 mg via ORAL
  Filled 2019-11-02: qty 1

## 2019-11-02 MED ORDER — ACETAMINOPHEN 650 MG RE SUPP: 650.0000 mg | Freq: Four times a day (QID) | RECTAL | Status: DC | PRN

## 2019-11-02 MED ORDER — POLYETHYLENE GLYCOL 3350 17 G PO PACK
17.0000 g | PACK | Freq: Every day | ORAL | Status: DC | PRN
  Administered 2019-11-03: 17 g via ORAL
  Filled 2019-11-02: qty 1

## 2019-11-02 MED ORDER — LACTATED RINGERS IV BOLUS
20.0000 mL/kg | Freq: Once | INTRAVENOUS | Status: AC
  Administered 2019-11-02: 2268 mL via INTRAVENOUS

## 2019-11-02 MED ORDER — ENOXAPARIN SODIUM 60 MG/0.6ML ~~LOC~~ SOLN
60.0000 mg | SUBCUTANEOUS | Status: DC
  Administered 2019-11-02 – 2019-11-05 (×4): 60 mg via SUBCUTANEOUS
  Filled 2019-11-02 (×5): qty 0.6

## 2019-11-02 MED ORDER — ONDANSETRON HCL 4 MG/2ML IJ SOLN
4.0000 mg | Freq: Four times a day (QID) | INTRAMUSCULAR | Status: DC | PRN
  Administered 2019-11-02 – 2019-11-05 (×3): 4 mg via INTRAVENOUS
  Filled 2019-11-02 (×3): qty 2

## 2019-11-02 MED ORDER — ONDANSETRON 4 MG PO TBDP
4.0000 mg | ORAL_TABLET | Freq: Once | ORAL | Status: AC | PRN
  Administered 2019-11-02: 4 mg via ORAL
  Filled 2019-11-02: qty 1

## 2019-11-02 MED ORDER — SODIUM CHLORIDE 0.9% FLUSH
3.0000 mL | Freq: Two times a day (BID) | INTRAVENOUS | Status: DC
  Administered 2019-11-02 – 2019-11-06 (×7): 3 mL via INTRAVENOUS

## 2019-11-02 MED ORDER — IOHEXOL 300 MG/ML  SOLN
100.0000 mL | Freq: Once | INTRAMUSCULAR | Status: AC | PRN
  Administered 2019-11-02: 100 mL via INTRAVENOUS

## 2019-11-02 NOTE — Plan of Care (Signed)
  Problem: Education: Goal: Knowledge of General Education information will improve Description Including pain rating scale, medication(s)/side effects and non-pharmacologic comfort measures Outcome: Progressing   

## 2019-11-02 NOTE — ED Notes (Signed)
Patient had a witnessed episode of SVT, HR sustained in the 190s for about 4 minuites, had patient suck in through an empty syringe and he converted to NSR. Hospitalist paged.

## 2019-11-02 NOTE — H&P (Addendum)
[facility] History and Physical  Talib Headley LFY:[id number] DOB: 05-29-1970 DOA: 11/02/2019  Referring physician: Dietrich Pates, PA-C PCP: Sharon Seller, NP   Chief Complaint: Abdominal pain  HPI: Taylor Kidd is a 49 y.o. male with history of chronic low back pain, GERD, and recent admission for idiopathic pancreatitis, presents with abdominal pain.  Patient recently admitted from December 24-30, 2020, for first episode of acute pancreatitis.  At that time etiology not clear, minimal alcohol intake and triglycerides sixty-seven on admission.  Patient reports that he began to have abdominal pain 2 nights ago which is gotten progressively worse.  Located in the epigastric region and radiating to his back.  Today is also have some nausea and vomiting.  Denies any blood in emesis or bowel movements.  Has not taken any new medications recently, does take tramadol regularly for low back pain.  Denies regular alcohol use, though does endorse binge drinking of 6-8 beers occasionally on the weekends when he goes golfing though not every weekend.  In the ED initial vital signs notable only for hypertension with blood pressures ranging 140s to 160s over 80s to 90s.  Lab work-up notable for unremarkable CMP, CBC with white count elevated at 19.9, and lipase of 144.  CT abdomen and pelvis was obtained which showed acute pancreatitis, no cholelithiasis.  He was given IV fluids, and nausea meds.  He is admitted for further management of his pancreatitis.  Review of Systems:  Pertinent positives and negative per HPI, all others reviewed and negative  Past Medical History:  Diagnosis Date  . Anxiety   . Arthritis   . Depression   . Diabetes mellitus without complication (HCC)   . GERD (gastroesophageal reflux disease)   . Heartburn   . Hematuria, unspecified   . Lumbago   . Other abnormal blood chemistry   . Other malaise and fatigue   . Palpitations   . Unspecified vitamin D  deficiency    Past Surgical History:  Procedure Laterality Date  . SHOULDER ARTHROSCOPY WITH ROTATOR CUFF REPAIR AND SUBACROMIAL DECOMPRESSION Right 12/22/2016   Procedure: Right shoulder arthroscopy, subacromial decompression, mini open rotator cuff repair;  Surgeon: Jene Every, MD;  Location: [facility];  Service: Orthopedics;  Laterality: Right;  120 mins   Social History:  reports that he quit smoking about 2 years ago. His smoking use included cigarettes. He has a 15.00 pack-year smoking history. He has never used smokeless tobacco. He reports current alcohol use. He reports that he does not use drugs.  No Known Allergies  No family history on file.   Prior to Admission medications   Medication Sig Start Date End Date Taking? Authorizing Provider  acetaminophen (TYLENOL) 500 MG tablet Take 500 mg by mouth as needed for mild pain.    Yes [provider]  atorvastatin (LIPITOR) 20 MG tablet Take 1 tablet (20 mg total) by mouth daily. 06/19/19  Yes Sharon Seller, NP  cholecalciferol (VITAMIN D3) 25 MCG (1000 UNIT) tablet Take 1,000 Units by mouth 3 (three) times a week.   Yes [provider]  diphenhydrAMINE-APAP, sleep, (GOODY PM PO) Take 1 Package by mouth at bedtime as needed (sleep).   Yes [provider]  Testosterone 30 MG/ACT SOLN Apply one pump under the arm once daily 10/11/19  Yes Eubanks, Janene Harvey, NP  traMADol (ULTRAM) 50 MG tablet TAKE 1 TABLET (50 MG TOTAL) BY MOUTH 3 (THREE) TIMES DAILY AS NEEDED. Patient taking differently: Take 50 mg by mouth  3 (three) times daily as needed for moderate pain.  10/11/19  Yes Sharon Seller, NP  vitamin B-12 (CYANOCOBALAMIN) 1000 MCG tablet Take 1,000 mcg by mouth daily.   Yes [provider]   Physical Exam: Vitals:   11/02/19 1230 11/02/19 1245 11/02/19 1300 11/02/19 1330  BP: (!) 156/98 (!) 144/102 (!) 150/85 (!) 151/92  Pulse: 85 79 87 80  Resp:  18  19  Temp:      TempSrc:      SpO2: 98%  97% 97% 98%  Weight:      Height:        Wt Readings from Last 3 Encounters:  11/02/19 113.4 kg  02/26/19 113.6 kg  02/20/19 116.4 kg     . General:  Appears calm and comfortable . Eyes: PERRL, normal lids, irises & conjunctiva . ENT: grossly normal hearing, lips & tongue . Neck: no LAD, masses or thyromegaly . Cardiovascular: RRR, no m/r/g. No LE edema.  Marland Kitchen Respiratory: CTA bilaterally, no w/r/r. Normal respiratory effort. . Abdomen: soft, diffusely tender to palpation, worse in the epigastrium and RUQ . Skin: no rash or induration seen on limited exam . Musculoskeletal: grossly normal tone BUE/BLE . Psychiatric: grossly normal mood and affect, speech fluent and appropriate . Neurologic: grossly non-focal.          Labs on Admission:  Basic Metabolic Panel: Recent Labs  Lab 11/02/19 0920  NA 136  K 4.0  CL 98  CO2 25  GLUCOSE 119*  BUN 15  CREATININE 0.73  CALCIUM 9.8   Liver Function Tests: Recent Labs  Lab 11/02/19 0920  AST 21  ALT 27  ALKPHOS 91  BILITOT 1.3*  PROT 8.1  ALBUMIN 4.2   Recent Labs  Lab 11/02/19 0920  LIPASE 144*   No results for input(s): AMMONIA in the last 168 hours. CBC: Recent Labs  Lab 11/02/19 0920  WBC 19.9*  HGB 16.6  HCT 49.1  MCV 82.0  PLT 248   Cardiac Enzymes: No results for input(s): CKTOTAL, CKMB, CKMBINDEX, TROPONINI in the last 168 hours.  BNP (last 3 results) No results for input(s): BNP in the last 8760 hours.  ProBNP (last 3 results) No results for input(s): PROBNP in the last 8760 hours.  CBG: No results for input(s): GLUCAP in the last 168 hours.  Radiological Exams on Admission: CT ABDOMEN PELVIS W CONTRAST  Result Date: 11/02/2019 CLINICAL DATA:  Supraumbilical abdominal pain for 2-3 days, suspected pancreatitis, history of pancreatitis EXAM: CT ABDOMEN AND PELVIS WITH CONTRAST TECHNIQUE: Multidetector CT imaging of the abdomen and pelvis was performed using the standard protocol following  bolus administration of intravenous contrast. Sagittal and coronal MPR images reconstructed from axial data set. CONTRAST:  OMNIPAQUE IOHEXOL 300 MG/ML SOLN IV. No oral contrast. COMPARISON:  02/15/2019 FINDINGS: Lower chest: Minimal bibasilar atelectasis Hepatobiliary: Gallbladder and liver normal appearance Pancreas: Enlargement and surrounding edema at the pancreatic head/body consistent with acute pancreatitis. Less edema along pancreatic tail. No pancreatic mass, ductal dilatation, calcification or abnormal fluid collection. Spleen: Normal appearance Adrenals/Urinary Tract: No significant abnormalities of the adrenal glands, kidneys, ureters, or bladder Stomach/Bowel: Normal appendix. Mild wall thickening of the descending duodenum with slight distension of the distal second and third portions likely related to adjacent pancreatitis. No evidence of obstruction. Stomach and bowel loops otherwise unremarkable. Vascular/Lymphatic: Aorta normal caliber. Vascular structures patent, including portal vein, SMV, and splenic vein. Few normal sized peripancreatic nodes. No abdominal or pelvic adenopathy. Reproductive: Unremarkable prostate  gland and seminal vesicles Other: Umbilical hernia containing fat.  No free air or free fluid. Musculoskeletal: Degenerative disc disease changes L5-S1 with endplate spurring and bulging disc. IMPRESSION: Acute pancreatitis involving the pancreatic head/body with edema of adjacent duodenum. No acute complications of pancreatitis identified. Umbilical hernia containing fat. No other intra-abdominal or intrapelvic abnormalities. Electronically Signed   By: Ulyses Southward M.D.   On: 11/02/2019 12:47    EKG: Not obtained  Assessment/Plan Active Problems:   Pancreatitis   #Acute pancreatitis Patient presenting with second episode of acute pancreatitis of unclear etiology.  Triglycerides normal on last admission, will recheck.  Endorses occasional binge drinking but no  consistent use to suggest alcohol as the etiology.  Does use tramadol regularly, possible has some sphincter of Oddi dysfunction but this also seems unlikely.  No other likely medication culprits.  No evidence of gallstone disease on CT. -Diet n.p.o., advance diet as tolerated -LR at 150 cc an hour -Pain and nausea medicines as needed -GI consulted to guide further work-up given recurrent episode  #Leukocytosis Marked leukocytosis with white count of 19.9 on admission, however otherwise no signs of infection or evidence of abscess on CT abdomen pelvis.  Likely reactive in setting of his pancreatitis, low threshold to initiate antibiotics if he decompensates.  #SVT Per ED nurse patient had spontaneous episode of SVT that lasted approximately 2 to 3 minutes and resolved with Valsalva maneuver.  Patient reported palpitations during that time but was otherwise hemodynamically stable, I did not witness the event.  Will place on telemetry to monitor for further episodes.  #Chronic medical problems Low testosterone: Continue testosterone supplement Hyperlipidemia: Hold atorvastatin while n.p.o. Chronic low back pain: Hold p.o. tramadol  Code Status: Full code, confirmed DVT Prophylaxis: Lovenox Family Communication: Patient and wife Taylor Kidd updated at bedside Disposition Plan: Inpatient  Time spent: 66 min  Venora Maples MD/MPH [facility]

## 2019-11-02 NOTE — ED Provider Notes (Addendum)
[facility]-EMERGENCY DEPT Provider Note   CSN: [id number] Arrival date & time: 11/02/19  0813     History Chief Complaint  Patient presents with  . Abdominal Pain  . Nausea  . Emesis    Taylor Kidd is a 49 y.o. male with a past medical history of pancreatitis requiring admission in December 2020 presenting to the ED with a chief complaint of abdominal pain.  Symptoms began yesterday.  Reports constant sharp pain in his epigastric area with associated nausea and nonbloody, nonbilious emesis.  No bowel movement yesterday.  Denies any dysuria or other urinary symptoms.  States that this does feel similar to his prior episode of pancreatitis.  He does not know exactly what caused that.  Reports occasional alcohol use but denies any daily use.  Denies any right upper quadrant pain or history of cholelithiasis.  No prior abdominal surgeries.  No sick contacts with similar symptoms or suspicious food intake.  Denies any fever, shortness of breath, chest pain no recent travel.  Pain has not been controlled despite his daily tramadol for chronic pain.  HPI     Past Medical History:  Diagnosis Date  . Anxiety   . Arthritis   . Depression   . Diabetes mellitus without complication (HCC)   . GERD (gastroesophageal reflux disease)   . Heartburn   . Hematuria, unspecified   . Lumbago   . Other abnormal blood chemistry   . Other malaise and fatigue   . Palpitations   . Unspecified vitamin D deficiency     Patient Active Problem List   Diagnosis Date Noted  . Right shoulder pain 03/29/2016  . Low testosterone 12/03/2014  . Anxiety state 04/08/2014  . Vitamin D deficiency 06/26/2013  . Hyperlipidemia 06/26/2013  . Chronic pain syndrome 09/18/2009  . GERD 09/18/2009  . DISC DISEASE, LUMBAR 09/18/2009  . LOW BACK PAIN, CHRONIC 09/18/2009    Past Surgical History:  Procedure Laterality Date  . SHOULDER ARTHROSCOPY WITH ROTATOR CUFF REPAIR AND SUBACROMIAL  DECOMPRESSION Right 12/22/2016   Procedure: Right shoulder arthroscopy, subacromial decompression, mini open rotator cuff repair;  Surgeon: Jene Every, MD;  Location: [facility];  Service: Orthopedics;  Laterality: Right;  120 mins       No family history on file.  Social History   Tobacco Use  . Smoking status: Former Smoker    Packs/day: 0.50    Years: 30.00    Pack years: 15.00    Types: Cigarettes    Quit date: 03/23/2017    Years since quitting: 2.6  . Smokeless tobacco: Never Used  Vaping Use  . Vaping Use: Former  Substance Use Topics  . Alcohol use: Yes    Alcohol/week: 0.0 standard drinks    Comment: rarely   . Drug use: No    Home Medications Prior to Admission medications   Medication Sig Start Date End Date Taking? Authorizing Provider  acetaminophen (TYLENOL) 500 MG tablet Take 500 mg by mouth as needed.    [provider]  atorvastatin (LIPITOR) 20 MG tablet Take 1 tablet (20 mg total) by mouth daily. 06/19/19   Sharon Seller, NP  Testosterone 30 MG/ACT SOLN Apply one pump under the arm once daily 10/11/19   Sharon Seller, NP  traMADol (ULTRAM) 50 MG tablet TAKE 1 TABLET (50 MG TOTAL) BY MOUTH 3 (THREE) TIMES DAILY AS NEEDED. 10/11/19   Sharon Seller, NP    Allergies    Patient has no known  allergies.  Review of Systems   Review of Systems  Constitutional: Negative for appetite change, chills and fever.  HENT: Negative for ear pain, rhinorrhea, sneezing and sore throat.   Eyes: Negative for photophobia and visual disturbance.  Respiratory: Negative for cough, chest tightness, shortness of breath and wheezing.   Cardiovascular: Negative for chest pain and palpitations.  Gastrointestinal: Positive for abdominal pain, nausea and vomiting. Negative for blood in stool, constipation and diarrhea.  Genitourinary: Negative for dysuria, hematuria and urgency.  Musculoskeletal: Negative for myalgias.  Skin: Negative for rash.  Neurological:  Negative for dizziness, weakness and light-headedness.    Physical Exam Updated Vital Signs BP (!) 144/102 (BP Location: Left Arm)   Pulse 79   Temp 98.2 F (36.8 C) (Oral)   Resp 18   Ht 6\' 3"  (1.905 m)   Wt 113.4 kg   SpO2 97%   BMI 31.25 kg/m   Physical Exam Vitals and nursing note reviewed.  Constitutional:      General: He is not in acute distress.    Appearance: He is well-developed.  HENT:     Head: Normocephalic and atraumatic.     Nose: Nose normal.  Eyes:     General: No scleral icterus.       Left eye: No discharge.     Conjunctiva/sclera: Conjunctivae normal.  Cardiovascular:     Rate and Rhythm: Normal rate and regular rhythm.     Heart sounds: Normal heart sounds. No murmur heard.  No friction rub. No gallop.   Pulmonary:     Effort: Pulmonary effort is normal. No respiratory distress.     Breath sounds: Normal breath sounds.  Abdominal:     General: Bowel sounds are normal. There is no distension.     Palpations: Abdomen is soft.     Tenderness: There is abdominal tenderness in the epigastric area. There is no guarding.  Musculoskeletal:        General: Normal range of motion.     Cervical back: Normal range of motion and neck supple.  Skin:    General: Skin is warm and dry.     Findings: No rash.  Neurological:     Mental Status: He is alert.     Motor: No abnormal muscle tone.     Coordination: Coordination normal.     ED Results / Procedures / Treatments   Labs (all labs ordered are listed, but only abnormal results are displayed) Labs Reviewed  LIPASE, BLOOD - Abnormal; Notable for the following components:      Result Value   Lipase 144 (*)    All other components within normal limits  COMPREHENSIVE METABOLIC PANEL - Abnormal; Notable for the following components:   Glucose, Bld 119 (*)    Total Bilirubin 1.3 (*)    All other components within normal limits  CBC - Abnormal; Notable for the following components:   WBC 19.9 (*)     RBC 5.99 (*)    All other components within normal limits  SARS CORONAVIRUS 2 BY RT PCR (HOSPITAL ORDER, PERFORMED IN [facility])    EKG None  Radiology CT ABDOMEN PELVIS W CONTRAST  Result Date: 11/02/2019 CLINICAL DATA:  Supraumbilical abdominal pain for 2-3 days, suspected pancreatitis, history of pancreatitis EXAM: CT ABDOMEN AND PELVIS WITH CONTRAST TECHNIQUE: Multidetector CT imaging of the abdomen and pelvis was performed using the standard protocol following bolus administration of intravenous contrast. Sagittal and coronal MPR images reconstructed from axial data  set. CONTRAST:  OMNIPAQUE IOHEXOL 300 MG/ML SOLN IV. No oral contrast. COMPARISON:  02/15/2019 FINDINGS: Lower chest: Minimal bibasilar atelectasis Hepatobiliary: Gallbladder and liver normal appearance Pancreas: Enlargement and surrounding edema at the pancreatic head/body consistent with acute pancreatitis. Less edema along pancreatic tail. No pancreatic mass, ductal dilatation, calcification or abnormal fluid collection. Spleen: Normal appearance Adrenals/Urinary Tract: No significant abnormalities of the adrenal glands, kidneys, ureters, or bladder Stomach/Bowel: Normal appendix. Mild wall thickening of the descending duodenum with slight distension of the distal second and third portions likely related to adjacent pancreatitis. No evidence of obstruction. Stomach and bowel loops otherwise unremarkable. Vascular/Lymphatic: Aorta normal caliber. Vascular structures patent, including portal vein, SMV, and splenic vein. Few normal sized peripancreatic nodes. No abdominal or pelvic adenopathy. Reproductive: Unremarkable prostate gland and seminal vesicles Other: Umbilical hernia containing fat.  No free air or free fluid. Musculoskeletal: Degenerative disc disease changes L5-S1 with endplate spurring and bulging disc. IMPRESSION: Acute pancreatitis involving the pancreatic head/body with edema of adjacent duodenum. No  acute complications of pancreatitis identified. Umbilical hernia containing fat. No other intra-abdominal or intrapelvic abnormalities. Electronically Signed   By: Ulyses Southward M.D.   On: 11/02/2019 12:47    Procedures Procedures (including critical care time)  Medications Ordered in ED Medications  ondansetron (ZOFRAN-ODT) disintegrating tablet 4 mg (4 mg Oral Given 11/02/19 1100)  sodium chloride 0.9 % bolus 1,000 mL (1,000 mLs Intravenous New Bag/Given 11/02/19 1203)  HYDROmorphone (DILAUDID) injection 1 mg (1 mg Intravenous Given 11/02/19 1158)  iohexol (OMNIPAQUE) 300 MG/ML solution 100 mL (100 mLs Intravenous Contrast Given 11/02/19 1212)  HYDROmorphone (DILAUDID) injection 0.5 mg (0.5 mg Intravenous Given 11/02/19 1309)    ED Course  I have reviewed the triage vital signs and the nursing notes.  Pertinent labs & imaging results that were available during my care of the patient were reviewed by me and considered in my medical decision making (see chart for details).  Clinical Course as of Nov 02 1339  Sat Nov 02, 2019  1119 Lipase(!): 144 [HK]  1119 WBC(!): 19.9 [HK]    Clinical Course User Index [HK] Dietrich Pates, PA-C   MDM Rules/Calculators/A&P                          49 year old male presenting to the ED with epigastric pain and vomiting.  Symptoms began yesterday and states that this feels similar to his episode of pancreatitis in December 2020.  This was his first episode of pancreatitis.  Unknown cause at the time, triglycerides were 67, minimal alcohol use.  Reviewed admission from December, lipase elevated to the 2000s at the time and CT scan showed acute interstitial edematous pancreatitis with small amount of peripancreatic free fluid without organized collection.  He was admitted for about 6 days.  Here he has tenderness of the epigastric area without rebound or guarding.  Lab work significant for leukocytosis of 19.9, lipase of 144.  We will need to obtain CT scan due to  his history.  In the meantime we will treat with pain medication, IV fluids.  1:07 PM CT scan shows acute uncomplicated pancreatitis. Will attempt to control pain here as he reports some improvement noted with first dose of Dilaudid.  1:40 PM Patient continues to have pain that is not fully controlled with meds.  I had a discussion with the patient regarding his home medications and these have not been working for his pain since symptoms began yesterday.  Feel that he will benefit from admission for IV fluids, pain control.  Will admit to medicine service.    Portions of this note were generated with Scientist, clinical (histocompatibility and immunogenetics). Dictation errors may occur despite best attempts at proofreading.  Final Clinical Impression(s) / ED Diagnoses Final diagnoses:  Acute pancreatitis without infection or necrosis, unspecified pancreatitis type    Rx / DC Orders ED Discharge Orders    None         Dietrich Pates, PA-C 11/02/19 1341    Pollyann Savoy, MD 11/02/19 1946

## 2019-11-02 NOTE — ED Notes (Signed)
Report given to [place], RN on 4W.

## 2019-11-02 NOTE — ED Triage Notes (Signed)
Patient here from home reporting abd pain above the belly button that started 2-3 days ago. "feels like same pain". Hx of pancreatitis.

## 2019-11-03 DIAGNOSIS — K8500 Idiopathic acute pancreatitis without necrosis or infection: Secondary | ICD-10-CM

## 2019-11-03 LAB — COMPREHENSIVE METABOLIC PANEL
ALT: 19 U/L (ref 0–44)
AST: 16 U/L (ref 15–41)
Albumin: 3.6 g/dL (ref 3.5–5.0)
Alkaline Phosphatase: 73 U/L (ref 38–126)
Anion gap: 12 (ref 5–15)
BUN: 10 mg/dL (ref 6–20)
CO2: 25 mmol/L (ref 22–32)
Calcium: 9.2 mg/dL (ref 8.9–10.3)
Chloride: 97 mmol/L — ABNORMAL LOW (ref 98–111)
Creatinine, Ser: 0.61 mg/dL (ref 0.61–1.24)
GFR calc Af Amer: >60 mL/min (ref >60)
GFR calc non Af Amer: >60 mL/min (ref >60)
Glucose, Bld: 100 mg/dL — ABNORMAL HIGH (ref 70–99)
Potassium: 3.4 mmol/L — ABNORMAL LOW (ref 3.5–5.1)
Sodium: 134 mmol/L — ABNORMAL LOW (ref 135–145)
Total Bilirubin: 1.5 mg/dL — ABNORMAL HIGH (ref 0.3–1.2)
Total Protein: 6.8 g/dL (ref 6.5–8.1)

## 2019-11-03 LAB — CBC
HCT: 42.9 % (ref 39.0–52.0)
Hemoglobin: 14.3 g/dL (ref 13.0–17.0)
MCH: 27.6 pg (ref 26.0–34.0)
MCHC: 33.3 g/dL (ref 30.0–36.0)
MCV: 82.8 fL (ref 80.0–100.0)
Platelets: 208 10*3/uL (ref 150–400)
RBC: 5.18 MIL/uL (ref 4.22–5.81)
RDW: 12.5 % (ref 11.5–15.5)
WBC: 19.2 10*3/uL — ABNORMAL HIGH (ref 4.0–10.5)
nRBC: 0 % (ref 0.0–0.2)

## 2019-11-03 LAB — TRIGLYCERIDES: Triglycerides: 113 mg/dL (ref <150)

## 2019-11-03 MED ORDER — POTASSIUM CHLORIDE CRYS ER 20 MEQ PO TBCR
40.0000 meq | EXTENDED_RELEASE_TABLET | Freq: Once | ORAL | Status: AC
  Administered 2019-11-03: 40 meq via ORAL
  Filled 2019-11-03: qty 2

## 2019-11-03 NOTE — Progress Notes (Addendum)
PROGRESS NOTE    Taylor Kidd  HYQ:[id number] DOB: 1970/06/20 DOA: 11/02/2019 PCP: Sharon SellerEubanks, Jessica K, NP   Brief Narrative:  HPI: Taylor Kidd is a 49 y.o. male with history of chronic low back pain, GERD, and recent admission for idiopathic pancreatitis, presents with abdominal pain.  Patient recently admitted from December 24-30, 2020, for first episode of acute pancreatitis.  At that time etiology not clear, minimal alcohol intake and triglycerides sixty-seven on admission.  Patient reports that he began to have abdominal pain 2 nights ago which is gotten progressively worse.  Located in the epigastric region and radiating to his back.  Today is also have some nausea and vomiting.  Denies any blood in emesis or bowel movements.  Has not taken any new medications recently, does take tramadol regularly for low back pain.  Denies regular alcohol use, though does endorse binge drinking of 6-8 beers occasionally on the weekends when he goes golfing though not every weekend.  In the ED initial vital signs notable only for hypertension with blood pressures ranging 140s to 160s over 80s to 90s.  Lab work-up notable for unremarkable CMP, CBC with white count elevated at 19.9, and lipase of 144.  CT abdomen and pelvis was obtained which showed acute pancreatitis, no cholelithiasis.  He was given IV fluids, and nausea meds.   Assessment & Plan:   Active Problems:   GERD   LOW BACK PAIN, CHRONIC   Pancreatitis   #Acute pancreatitis: Patient presenting with second episode of acute pancreatitis of unclear etiology.  Triglycerides normal on last admission.  Triglyceride level pending this admission.  Ultrasound abdomen negative for gallstones.  Endorses occasional binge drinking.  He is also on atorvastatin for few years.  His cause could be alcohol induced, medication induced or idiopathic.  Pain is improved today.  We will start him on clears.  Continue pain medications.  GI on board.  MRCP  ordered.  #Leukocytosis: Marked leukocytosis with white count of 19.9 on admission, however otherwise no signs of infection or evidence of abscess on CT abdomen pelvis.  Likely reactive in setting of his pancreatitis, low threshold to initiate antibiotics if he decompensates.  #SVT Per ED nurse patient had spontaneous episode of SVT that lasted approximately 2 to 3 minutes and resolved with Valsalva maneuver.  Patient reported palpitations during that time but was otherwise hemodynamically stable, no further episodes as such reported.  Hypokalemia: 3.4.  Replaced today.  #Chronic medical problems Low testosterone: Continue testosterone supplement Hyperlipidemia: Hold atorvastatin  Chronic low back pain: Hold p.o. tramadol  DVT prophylaxis:    Code Status: Full Code  Family Communication: None present at bedside.  Plan of care discussed with patient in length and he verbalized understanding and agreed with it.  Status is: Inpatient  Remains inpatient appropriate because:IV treatments appropriate due to intensity of illness or inability to take PO   Dispo: The patient is from: Home              Anticipated d/c is to: Home              Anticipated d/c date is: 2 days              Patient currently is not medically stable to d/c.        Estimated body mass index is 31.25 kg/m as calculated from the following:   Height as of this encounter: 6\' 3"  (1.905 m).   Weight as of this encounter: 113.4 kg.  Nutritional status:               Consultants:   GI  Procedures:   None  Antimicrobials:  Anti-infectives (From admission, onward)   None         Subjective: Seen and examined.  Abdominal pain improved from 9 to 6 out of 10.  No nausea.  No other complaint.  Objective: Vitals:   11/02/19 1731 11/02/19 2200 11/03/19 0209 11/03/19 0543  BP: (!) 154/86 (!) 162/95 (!) 159/98 (!) 147/91  Pulse: 73 84 82 86  Resp: 20  20 20   Temp: 99.8 F (37.7  C) 98.4 F (36.9 C) 98.3 F (36.8 C) 98.5 F (36.9 C)  TempSrc: Oral Oral Oral Oral  SpO2: 98% 98% 98% 95%  Weight:      Height:        Intake/Output Summary (Last 24 hours) at 11/03/2019 1333 Last data filed at 11/03/2019 0643 Gross per 24 hour  Intake 4317.3 ml  Output 1500 ml  Net 2817.3 ml   Filed Weights   11/02/19 0826  Weight: 113.4 kg    Examination:  General exam: Appears calm and comfortable  Respiratory system: Clear to auscultation. Respiratory effort normal. Cardiovascular system: S1 & S2 heard, RRR. No JVD, murmurs, rubs, gallops or clicks. No pedal edema. Gastrointestinal system: Abdomen is nondistended, soft and mild epigastric tenderness. No organomegaly or masses felt. Normal bowel sounds heard. Central nervous system: Alert and oriented. No focal neurological deficits. Extremities: Symmetric 5 x 5 power. Skin: No rashes, lesions or ulcers Psychiatry: Judgement and insight appear normal. Mood & affect appropriate.    Data Reviewed: I have personally reviewed following labs and imaging studies  CBC: Recent Labs  Lab 11/02/19 0920 11/03/19 0539  WBC 19.9* 19.2*  HGB 16.6 14.3  HCT 49.1 42.9  MCV 82.0 82.8  PLT 248 208   Basic Metabolic Panel: Recent Labs  Lab 11/02/19 0920 11/03/19 0539  NA 136 134*  K 4.0 3.4*  CL 98 97*  CO2 25 25  GLUCOSE 119* 100*  BUN 15 10  CREATININE 0.73 0.61  CALCIUM 9.8 9.2   GFR: Estimated Creatinine Clearance: 151.8 mL/min (by C-G formula based on SCr of 0.61 mg/dL). Liver Function Tests: Recent Labs  Lab 11/02/19 0920 11/03/19 0539  AST 21 16  ALT 27 19  ALKPHOS 91 73  BILITOT 1.3* 1.5*  PROT 8.1 6.8  ALBUMIN 4.2 3.6   Recent Labs  Lab 11/02/19 0920  LIPASE 144*   No results for input(s): AMMONIA in the last 168 hours. Coagulation Profile: No results for input(s): INR, PROTIME in the last 168 hours. Cardiac Enzymes: No results for input(s): CKTOTAL, CKMB, CKMBINDEX, TROPONINI in the last  168 hours. BNP (last 3 results) No results for input(s): PROBNP in the last 8760 hours. HbA1C: No results for input(s): HGBA1C in the last 72 hours. CBG: No results for input(s): GLUCAP in the last 168 hours. Lipid Profile: No results for input(s): CHOL, HDL, LDLCALC, TRIG, CHOLHDL, LDLDIRECT in the last 72 hours. Thyroid Function Tests: No results for input(s): TSH, T4TOTAL, FREET4, T3FREE, THYROIDAB in the last 72 hours. Anemia Panel: No results for input(s): VITAMINB12, FOLATE, FERRITIN, TIBC, IRON, RETICCTPCT in the last 72 hours. Sepsis Labs: No results for input(s): PROCALCITON, LATICACIDVEN in the last 168 hours.  Recent Results (from the past 240 hour(s))  SARS Coronavirus 2 by RT PCR (hospital order, performed in [facility] hospital lab) Nasopharyngeal Nasopharyngeal Swab     Status:  None   Collection Time: 11/02/19  1:49 PM   Specimen: Nasopharyngeal Swab  Result Value Ref Range Status   SARS Coronavirus 2 NEGATIVE NEGATIVE Final    Comment: (NOTE) SARS-CoV-2 target nucleic acids are NOT DETECTED.  The SARS-CoV-2 RNA is generally detectable in upper and lower respiratory specimens during the acute phase of infection. The lowest concentration of SARS-CoV-2 viral copies this assay can detect is 250 copies / mL. A negative result does not preclude SARS-CoV-2 infection and should not be used as the sole basis for treatment or other patient management decisions.  A negative result may occur with improper specimen collection / handling, submission of specimen other than nasopharyngeal swab, presence of viral mutation(s) within the areas targeted by this assay, and inadequate number of viral copies (<250 copies / mL). A negative result must be combined with clinical observations, patient history, and epidemiological information.  Fact Sheet for Patients:   BoilerBrush.com.cy  Fact Sheet for Healthcare  Providers: https://pope.com/  This test is not yet approved or  cleared by the [place] FDA and has been authorized for detection and/or diagnosis of SARS-CoV-2 by FDA under an Emergency Use Authorization (EUA).  This EUA will remain in effect (meaning this test can be used) for the duration of the COVID-19 declaration under Section 564(b)(1) of the Act, 21 U.S.C. section 360bbb-3(b)(1), unless the authorization is terminated or revoked sooner.  Performed at [facility], [address] [place], [place] [address]       Radiology Studies: CT ABDOMEN PELVIS W CONTRAST  Result Date: 11/02/2019 CLINICAL DATA:  Supraumbilical abdominal pain for 2-3 days, suspected pancreatitis, history of pancreatitis EXAM: CT ABDOMEN AND PELVIS WITH CONTRAST TECHNIQUE: Multidetector CT imaging of the abdomen and pelvis was performed using the standard protocol following bolus administration of intravenous contrast. Sagittal and coronal MPR images reconstructed from axial data set. CONTRAST:  OMNIPAQUE IOHEXOL 300 MG/ML SOLN IV. No oral contrast. COMPARISON:  02/15/2019 FINDINGS: Lower chest: Minimal bibasilar atelectasis Hepatobiliary: Gallbladder and liver normal appearance Pancreas: Enlargement and surrounding edema at the pancreatic head/body consistent with acute pancreatitis. Less edema along pancreatic tail. No pancreatic mass, ductal dilatation, calcification or abnormal fluid collection. Spleen: Normal appearance Adrenals/Urinary Tract: No significant abnormalities of the adrenal glands, kidneys, ureters, or bladder Stomach/Bowel: Normal appendix. Mild wall thickening of the descending duodenum with slight distension of the distal second and third portions likely related to adjacent pancreatitis. No evidence of obstruction. Stomach and bowel loops otherwise unremarkable. Vascular/Lymphatic: Aorta normal caliber. Vascular structures patent, including  portal vein, SMV, and splenic vein. Few normal sized peripancreatic nodes. No abdominal or pelvic adenopathy. Reproductive: Unremarkable prostate gland and seminal vesicles Other: Umbilical hernia containing fat.  No free air or free fluid. Musculoskeletal: Degenerative disc disease changes L5-S1 with endplate spurring and bulging disc. IMPRESSION: Acute pancreatitis involving the pancreatic head/body with edema of adjacent duodenum. No acute complications of pancreatitis identified. Umbilical hernia containing fat. No other intra-abdominal or intrapelvic abnormalities. Electronically Signed   By: Ulyses Southward M.D.   On: 11/02/2019 12:47   US Abdomen Limited RUQ  Result Date: 11/02/2019 CLINICAL DATA:  Abdominal pain with evidence of pancreatitis on recent CT EXAM: ULTRASOUND ABDOMEN LIMITED RIGHT UPPER QUADRANT COMPARISON:  CT abdomen and pelvis November 02, 2019 FINDINGS: Gallbladder: No gallstones or wall thickening visualized. There is no pericholecystic fluid. No sonographic Murphy sign noted by sonographer. Common bile duct: Diameter: 3 mm. No intrahepatic or extrahepatic biliary duct dilatation. Liver: No  focal lesion identified. Within normal limits in parenchymal echogenicity. Portal vein is patent on color Doppler imaging with normal direction of blood flow towards the liver. Other: None. IMPRESSION: Study within normal limits. Electronically Signed   By: Bretta Bang III M.D.   On: 11/02/2019 16:19    Scheduled Meds: . enoxaparin (LOVENOX) injection  60 mg Subcutaneous Q24H  . sodium chloride flush  3 mL Intravenous Q12H  . testosterone  50 g Transdermal Daily   Continuous Infusions: . lactated ringers 300 mL/hr at 11/03/19 3361     LOS: 1 day   Time spent: 35 minutes   Hughie Closs, MD [facility]  11/03/2019, 1:33 PM   To contact the attending provider between 7A-7P or the covering provider during after hours 7P-7A, please log into the web site [email or web address].

## 2019-11-03 NOTE — Consult Note (Signed)
[facility] Consult  Referring Provider: [facility] Primary Care Physician:  Taylor Kidd, Taylor K, NP Primary Gastroenterologist: Taylor FitzUnassigned  Reason for Consultation: Recurrent pancreatitis  HPI: Taylor Kidd is a 49 y.o. male was admitted to the hospital yesterday with complains of epigastric discomfort for the last 2 to 3 days associated with nausea and one episode of vomiting.  Patient had pancreatitis in December 2020, unclear etiology.  Since the pain was similar to the pain in December he decided to come for evaluation in the ER where he was found to have lipase of 144 and subsequently had a CAT scan which showed acute pancreatitis.   Patient states he drinks about 7-8 beers over the weekend, last alcohol intake was last weekend.  He denies daily drinking or binge drinking.  Prior work-up has revealed no evidence of gallstones and normal triglycerides. He used to be on Metformin for impaired glucose tolerance, however is not on any diabetic medications.  He denies use of any over-the-counter medications or newly prescribed medications recently.  There is no family history of pancreatitis or pancreatic cancer.  Patient denies change in bowel habits, noticing blood in stool or black stool.  No prior endoscopy or colonoscopy. He denies acid reflux, heartburn, difficulty swallowing, pain on swallowing, early satiety or bloating.  Past Medical History:  Diagnosis Date  . Anxiety   . Arthritis   . Depression   . Diabetes mellitus without complication (HCC)   . GERD (gastroesophageal reflux disease)   . Heartburn   . Hematuria, unspecified   . Lumbago   . Other abnormal blood chemistry   . Other malaise and fatigue   . Palpitations   . Unspecified vitamin D deficiency     Past Surgical History:  Procedure Laterality Date  . SHOULDER ARTHROSCOPY WITH ROTATOR CUFF REPAIR AND SUBACROMIAL DECOMPRESSION Right 12/22/2016   Procedure: Right shoulder arthroscopy,  subacromial decompression, mini open rotator cuff repair;  Surgeon: Jene EveryBeane, Jeffrey, MD;  Location: [facility];  Service: Orthopedics;  Laterality: Right;  120 mins    Prior to Admission medications   Medication Sig Start Date End Date Taking? Authorizing Provider  acetaminophen (TYLENOL) 500 MG tablet Take 500 mg by mouth as needed for mild pain.    Yes [provider]  atorvastatin (LIPITOR) 20 MG tablet Take 1 tablet (20 mg total) by mouth daily. 06/19/19  Yes Taylor Kidd, Taylor K, NP  cholecalciferol (VITAMIN D3) 25 MCG (1000 UNIT) tablet Take 1,000 Units by mouth 3 (three) times a week.   Yes [provider]  diphenhydrAMINE-APAP, sleep, (GOODY PM PO) Take 1 Package by mouth at bedtime as needed (sleep).   Yes [provider]  Testosterone 30 MG/ACT SOLN Apply one pump under the arm once daily 10/11/19  Yes Eubanks, Janene HarveyJessica K, NP  traMADol (ULTRAM) 50 MG tablet TAKE 1 TABLET (50 MG TOTAL) BY MOUTH 3 (THREE) TIMES DAILY AS NEEDED. Patient taking differently: Take 50 mg by mouth 3 (three) times daily as needed for moderate pain.  10/11/19  Yes Taylor Kidd, Taylor K, NP  vitamin B-12 (CYANOCOBALAMIN) 1000 MCG tablet Take 1,000 mcg by mouth daily.   Yes [provider]    Current Facility-Administered Medications  Medication Dose Route Frequency Provider Last Rate Last Admin  . acetaminophen (TYLENOL) tablet 650 mg  650 mg Oral Q6H PRN Venora MaplesEckstat, Matthew M, MD   650 mg at 11/03/19 0514   Or  . acetaminophen (TYLENOL) suppository 650 mg  650 mg Rectal Q6H PRN Merian CapronEckstat, Matthew  M, MD      . enoxaparin (LOVENOX) injection 60 mg  60 mg Subcutaneous Q24H Venora Maples, MD   60 mg at 11/02/19 1818  . HYDROmorphone (DILAUDID) injection 0.5-1 mg  0.5-1 mg Intravenous Q2H PRN Venora Maples, MD   1 mg at 11/03/19 1212  . lactated ringers infusion   Intravenous Continuous Venora Maples, MD 300 mL/hr at 11/03/19 3299 New Bag at 11/03/19 2426  . ondansetron (ZOFRAN)  tablet 4 mg  4 mg Oral Q6H PRN Venora Maples, MD       Or  . ondansetron [facility]) injection 4 mg  4 mg Intravenous Q6H PRN Venora Maples, MD   4 mg at 11/02/19 1819  . polyethylene glycol (MIRALAX / GLYCOLAX) packet 17 g  17 g Oral Daily PRN Venora Maples, MD      . sodium chloride flush (NS) 0.9 % injection 3 mL  3 mL Intravenous Q12H Venora Maples, MD   3 mL at 11/02/19 2259  . testosterone (ANDROGEL) 50 MG/5GM (1%) gel 50 g  50 g Transdermal Daily Venora Maples, MD        Allergies as of 11/02/2019  . (No Known Allergies)    No family history on file.  Social History   Socioeconomic History  . Marital status: Married    Spouse name: Not on file  . Number of children: Not on file  . Years of education: Not on file  . Highest education level: Not on file  Occupational History  . Not on file  Tobacco Use  . Smoking status: Former Smoker    Packs/day: 0.50    Years: 30.00    Pack years: 15.00    Types: Cigarettes    Quit date: 03/23/2017    Years since quitting: 2.6  . Smokeless tobacco: Never Used  Vaping Use  . Vaping Use: Former  Substance and Sexual Activity  . Alcohol use: Yes    Alcohol/week: 0.0 standard drinks    Comment: rarely   . Drug use: No  . Sexual activity: Yes  Other Topics Concern  . Not on file  Social History Narrative  . Not on file   Social Determinants of Health   Financial Resource Strain:   . Difficulty of Paying Living Expenses: Not on file  Food Insecurity:   . Worried About Programme researcher, broadcasting/film/video in the Last Year: Not on file  . Ran Out of Food in the Last Year: Not on file  Transportation Needs:   . Lack of Transportation (Medical): Not on file  . Lack of Transportation (Non-Medical): Not on file  Physical Activity:   . Days of Exercise per Week: Not on file  . Minutes of Exercise per Session: Not on file  Stress:   . Feeling of Stress : Not on file  Social Connections:   . Frequency of Communication with  Friends and Family: Not on file  . Frequency of Social Gatherings with Friends and Family: Not on file  . Attends Religious Services: Not on file  . Active Member of Clubs or Organizations: Not on file  . Attends Banker Meetings: Not on file  . Marital Status: Not on file  Intimate Partner Violence:   . Fear of Current or Ex-Partner: Not on file  . Emotionally Abused: Not on file  . Physically Abused: Not on file  . Sexually Abused: Not on file    Review of Systems: GI:  Described in detail in HPI.    Gen: Denies any fever, chills, rigors, night sweats, anorexia, fatigue, weakness, malaise, involuntary weight loss, and sleep disorder CV: Denies chest pain, angina, palpitations, syncope, orthopnea, PND, peripheral edema, and claudication. Resp: Denies dyspnea, cough, sputum, wheezing, coughing up blood. GU : Denies urinary burning, blood in urine, urinary frequency, urinary hesitancy, nocturnal urination, and urinary incontinence. MS: Denies joint pain or swelling.  Denies muscle weakness, cramps, atrophy.  Derm: Denies rash, itching, oral ulcerations, hives, unhealing ulcers.  Psych: Denies depression, anxiety, memory loss, suicidal ideation, hallucinations,  and confusion. Heme: Denies bruising, bleeding, and enlarged lymph nodes. Neuro:  Denies any headaches, dizziness, paresthesias. Endo:  Denies any problems with DM, thyroid, adrenal function.  Physical Exam: Vital signs in last 24 hours: Temp:  [98.3 F (36.8 C)-99.8 F (37.7 C)] 98.5 F (36.9 C) (09/12 0543) Pulse Rate:  [71-185] 86 (09/12 0543) Resp:  [13-21] 20 (09/12 0543) BP: (147-172)/(75-104) 147/91 (09/12 0543) SpO2:  [95 %-99 %] 95 % (09/12 0543)    General:   Alert,  Well-developed, well-nourished, pleasant and cooperative in NAD Head:  Normocephalic and atraumatic. Eyes:  Sclera clear, no icterus.   Conjunctiva pink. Ears:  Normal auditory acuity. Nose:  No deformity, discharge,  or  lesions. Mouth:  No deformity or lesions.  Oropharynx pink & moist. Neck:  Supple; no masses or thyromegaly. Lungs:  Clear throughout to auscultation.   No wheezes, crackles, or rhonchi. No acute distress. Heart:  Regular rate and rhythm; no murmurs, clicks, rubs,  or gallops. Extremities:  Without clubbing or edema. Neurologic:  Alert and  oriented x4;  grossly normal neurologically. Skin:  Intact without significant lesions or rashes. Psych:  Alert and cooperative. Normal mood and affect. Abdomen: Mild epigastric tenderness, mild right upper and right lower quadrant tenderness, sluggish bowel sounds, no obvious deformity, tympanic on percussion    Lab Results: Recent Labs    11/02/19 0920 11/03/19 0539  WBC 19.9* 19.2*  HGB 16.6 14.3  HCT 49.1 42.9  PLT 248 208   BMET Recent Labs    11/02/19 0920 11/03/19 0539  NA 136 134*  Kidd 4.0 3.4*  CL 98 97*  CO2 25 25  GLUCOSE 119* 100*  BUN 15 10  CREATININE 0.73 0.61  CALCIUM 9.8 9.2   LFT Recent Labs    11/03/19 0539  PROT 6.8  ALBUMIN 3.6  AST 16  ALT 19  ALKPHOS 73  BILITOT 1.5*   PT/INR No results for input(s): LABPROT, INR in the last 72 hours.  Studies/Results: CT ABDOMEN PELVIS W CONTRAST  Result Date: 11/02/2019 CLINICAL DATA:  Supraumbilical abdominal pain for 2-3 days, suspected pancreatitis, history of pancreatitis EXAM: CT ABDOMEN AND PELVIS WITH CONTRAST TECHNIQUE: Multidetector CT imaging of the abdomen and pelvis was performed using the standard protocol following bolus administration of intravenous contrast. Sagittal and coronal MPR images reconstructed from axial data set. CONTRAST:  OMNIPAQUE IOHEXOL 300 MG/ML SOLN IV. No oral contrast. COMPARISON:  02/15/2019 FINDINGS: Lower chest: Minimal bibasilar atelectasis Hepatobiliary: Gallbladder and liver normal appearance Pancreas: Enlargement and surrounding edema at the pancreatic head/body consistent with acute pancreatitis. Less edema along  pancreatic tail. No pancreatic mass, ductal dilatation, calcification or abnormal fluid collection. Spleen: Normal appearance Adrenals/Urinary Tract: No significant abnormalities of the adrenal glands, kidneys, ureters, or bladder Stomach/Bowel: Normal appendix. Mild wall thickening of the descending duodenum with slight distension of the distal second and third portions likely related to adjacent pancreatitis. No evidence  of obstruction. Stomach and bowel loops otherwise unremarkable. Vascular/Lymphatic: Aorta normal caliber. Vascular structures patent, including portal vein, SMV, and splenic vein. Few normal sized peripancreatic nodes. No abdominal or pelvic adenopathy. Reproductive: Unremarkable prostate gland and seminal vesicles Other: Umbilical hernia containing fat.  No free air or free fluid. Musculoskeletal: Degenerative disc disease changes L5-S1 with endplate spurring and bulging disc. IMPRESSION: Acute pancreatitis involving the pancreatic head/body with edema of adjacent duodenum. No acute complications of pancreatitis identified. Umbilical hernia containing fat. No other intra-abdominal or intrapelvic abnormalities. Electronically Signed   By: Ulyses Southward M.D.   On: 11/02/2019 12:47   US Abdomen Limited RUQ  Result Date: 11/02/2019 CLINICAL DATA:  Abdominal pain with evidence of pancreatitis on recent CT EXAM: ULTRASOUND ABDOMEN LIMITED RIGHT UPPER QUADRANT COMPARISON:  CT abdomen and pelvis November 02, 2019 FINDINGS: Gallbladder: No gallstones or wall thickening visualized. There is no pericholecystic fluid. No sonographic Murphy sign noted by sonographer. Common bile duct: Diameter: 3 mm. No intrahepatic or extrahepatic biliary duct dilatation. Liver: No focal lesion identified. Within normal limits in parenchymal echogenicity. Portal vein is patent on color Doppler imaging with normal direction of blood flow towards the liver. Other: None. IMPRESSION: Study within normal limits. Electronically  Signed   By: Bretta Bang III M.D.   On: 11/02/2019 16:19    Impression: Acute pancreatitis of unknown etiology Although patient has history of alcohol intake, he does not have history of alcohol abuse or binge drinking and ultrasound showed a normal gallbladder without evidence of gallstones Fortunately he has no endorgan damage, remains on room air, renal function is within normal limits  Plan: Will send IgG subclasses and ANA for possible autoimmune pancreatitis We will get an MRI and MRCP to rule out pancreatic divisum as possible etiology Continue IV fluids, electrolytes supplementation if needed He is currently on clear liquid diet, will decrease IV fluid to 150 mL/h Encouraged early aggressive ambulation, out of bed to chair, to walk around in the hallways On IV Dilaudid 0.5 to 1 mg every 2 hours as needed for pain control Advised patient to completely avoid alcohol as it can trigger recurrent pancreatitis.  He verbalized understanding and consents.    LOS: 1 day   Kerin Salen, MD  11/03/2019, 1:35 PM

## 2019-11-04 ENCOUNTER — Inpatient Hospital Stay (HOSPITAL_COMMUNITY): Payer: BC Managed Care – PPO

## 2019-11-04 LAB — CBC WITH DIFFERENTIAL/PLATELET
Abs Immature Granulocytes: 0.11 10*3/uL — ABNORMAL HIGH (ref 0.00–0.07)
Basophils Absolute: 0.1 10*3/uL (ref 0.0–0.1)
Basophils Relative: 0 %
Eosinophils Absolute: 0.4 10*3/uL (ref 0.0–0.5)
Eosinophils Relative: 3 %
HCT: 44.1 % (ref 39.0–52.0)
Hemoglobin: 14.2 g/dL (ref 13.0–17.0)
Immature Granulocytes: 1 %
Lymphocytes Relative: 13 %
Lymphs Abs: 1.8 10*3/uL (ref 0.7–4.0)
MCH: 27.1 pg (ref 26.0–34.0)
MCHC: 32.2 g/dL (ref 30.0–36.0)
MCV: 84.2 fL (ref 80.0–100.0)
Monocytes Absolute: 1 10*3/uL (ref 0.1–1.0)
Monocytes Relative: 7 %
Neutro Abs: 10.1 10*3/uL — ABNORMAL HIGH (ref 1.7–7.7)
Neutrophils Relative %: 76 %
Platelets: 208 10*3/uL (ref 150–400)
RBC: 5.24 MIL/uL (ref 4.22–5.81)
RDW: 12.5 % (ref 11.5–15.5)
WBC: 13.4 10*3/uL — ABNORMAL HIGH (ref 4.0–10.5)
nRBC: 0 % (ref 0.0–0.2)

## 2019-11-04 LAB — COMPREHENSIVE METABOLIC PANEL
ALT: 19 U/L (ref 0–44)
AST: 15 U/L (ref 15–41)
Albumin: 3.4 g/dL — ABNORMAL LOW (ref 3.5–5.0)
Alkaline Phosphatase: 80 U/L (ref 38–126)
Anion gap: 10 (ref 5–15)
BUN: 9 mg/dL (ref 6–20)
CO2: 29 mmol/L (ref 22–32)
Calcium: 9.3 mg/dL (ref 8.9–10.3)
Chloride: 99 mmol/L (ref 98–111)
Creatinine, Ser: 0.7 mg/dL (ref 0.61–1.24)
GFR calc Af Amer: >60 mL/min (ref >60)
GFR calc non Af Amer: >60 mL/min (ref >60)
Glucose, Bld: 93 mg/dL (ref 70–99)
Potassium: 3.6 mmol/L (ref 3.5–5.1)
Sodium: 138 mmol/L (ref 135–145)
Total Bilirubin: 0.8 mg/dL (ref 0.3–1.2)
Total Protein: 6.7 g/dL (ref 6.5–8.1)

## 2019-11-04 LAB — IGG 4: IgG, Subclass 4: 83 mg/dL (ref 2–96)

## 2019-11-04 LAB — IGG: IgG (Immunoglobin G), Serum: 912 mg/dL (ref 603–1613)

## 2019-11-04 LAB — ANTINUCLEAR ANTIBODIES, IFA: ANA Ab, IFA: NEGATIVE

## 2019-11-04 MED ORDER — ALPRAZOLAM 0.25 MG PO TABS
0.2500 mg | ORAL_TABLET | Freq: Once | ORAL | Status: AC
  Administered 2019-11-04: 0.25 mg via ORAL
  Filled 2019-11-04: qty 1

## 2019-11-04 MED ORDER — LACTATED RINGERS IV SOLN: INTRAVENOUS | Status: DC

## 2019-11-04 MED ORDER — GADOBUTROL 1 MMOL/ML IV SOLN
10.0000 mL | Freq: Once | INTRAVENOUS | Status: AC | PRN
  Administered 2019-11-04: 10 mL via INTRAVENOUS

## 2019-11-04 MED ORDER — TESTOSTERONE 30 MG/ACT TD SOLN
30.0000 ug | Freq: Every morning | TRANSDERMAL | Status: DC
  Administered 2019-11-04 – 2019-11-06 (×3): 30 ug via TRANSDERMAL

## 2019-11-04 NOTE — Progress Notes (Signed)
PROGRESS NOTE    Taylor Kidd  OBS:[id number] DOB: 06/06/1970 DOA: 11/02/2019 PCP: Sharon Seller, NP   Brief Narrative:  HPI: Taylor Kidd is a 49 y.o. male with history of chronic low back pain, GERD, and recent admission for idiopathic pancreatitis, presents with abdominal pain.  Patient recently admitted from December 24-30, 2020, for first episode of acute pancreatitis.  At that time etiology not clear, minimal alcohol intake and triglycerides sixty-seven on admission.  Patient reports that he began to have abdominal pain 2 nights ago which is gotten progressively worse.  Located in the epigastric region and radiating to his back.  Today is also have some nausea and vomiting.  Denies any blood in emesis or bowel movements.  Has not taken any new medications recently, does take tramadol regularly for low back pain.  Denies regular alcohol use, though does endorse binge drinking of 6-8 beers occasionally on the weekends when he goes golfing though not every weekend.  In the ED initial vital signs notable only for hypertension with blood pressures ranging 140s to 160s over 80s to 90s.  Lab work-up notable for unremarkable CMP, CBC with white count elevated at 19.9, and lipase of 144.  CT abdomen and pelvis was obtained which showed acute pancreatitis, no cholelithiasis.  He was given IV fluids, and nausea meds.   Assessment & Plan:   Active Problems:   GERD   LOW BACK PAIN, CHRONIC   Pancreatitis   #Acute pancreatitis: Patient presenting with second episode of acute pancreatitis of unclear etiology.  Triglycerides normal on last 2 admissions. Ultrasound abdomen negative for gallstones.  Endorses occasional binge drinking.  He is also on atorvastatin for few years.  His cause could be alcohol induced, medication induced or idiopathic.  Pain is improved today.  Tolerating clears but very hesitant to advance diet but after counseling, he is willing to try full liquid diet if MRCP  is unremarkable. pending MRCP today.  Appreciate GI help.  #Leukocytosis: Marked leukocytosis with white count of 19.9 on admission but has improved, however otherwise no signs of infection or evidence of abscess on CT abdomen pelvis.  Likely reactive in setting of his pancreatitis, low threshold to initiate antibiotics if he decompensates.  #SVT Per ED nurse patient had spontaneous episode of SVT that lasted approximately 2 to 3 minutes and resolved with Valsalva maneuver.  Patient reported palpitations during that time but was otherwise hemodynamically stable, no further episodes as such reported.  Hypokalemia: Resolved.  #Chronic medical problems Low testosterone: Continue testosterone supplement Hyperlipidemia: Hold atorvastatin  Chronic low back pain: Hold p.o. tramadol  DVT prophylaxis:    Code Status: Full Code  Family Communication: Wife at bedside.  Plan of care discussed with patient in length with patient and his wife and he verbalized understanding and agreed with it.  Status is: Inpatient  Remains inpatient appropriate because:IV treatments appropriate due to intensity of illness or inability to take PO   Dispo: The patient is from: Home              Anticipated d/c is to: Home              Anticipated d/c date is: 2 days              Patient currently is not medically stable to d/c.        Estimated body mass index is 31.25 kg/m as calculated from the following:   Height as of this encounter: 6\' 3"  (1.905  m).   Weight as of this encounter: 113.4 kg.      Nutritional status:               Consultants:   GI  Procedures:   None  Antimicrobials:  Anti-infectives (From admission, onward)   None         Subjective: Seen and examined.  Wife at the bedside.  His pain is 3 out of 10 today.  No nausea.  Little hesitant to advance diet.  Objective: Vitals:   11/03/19 1347 11/03/19 1514 11/03/19 2016 11/04/19 0436  BP: (!) 172/93  127/79 (!) 155/97 (!) 153/99  Pulse: 92 87 88 82  Resp: (!) 21  16 20   Temp: 99.1 F (37.3 C)  98.7 F (37.1 C) 98.5 F (36.9 C)  TempSrc: Oral  Oral Oral  SpO2: 95%  96% 96%  Weight:      Height:        Intake/Output Summary (Last 24 hours) at 11/04/2019 1355 Last data filed at 11/03/2019 1700 Gross per 24 hour  Intake 2193.23 ml  Output --  Net 2193.23 ml   Filed Weights   11/02/19 0826  Weight: 113.4 kg    Examination:  General exam: Appears calm and comfortable  Respiratory system: Clear to auscultation. Respiratory effort normal. Cardiovascular system: S1 & S2 heard, RRR. No JVD, murmurs, rubs, gallops or clicks. No pedal edema. Gastrointestinal system: Abdomen is nondistended, soft and moderate epigastric tenderness. No organomegaly or masses felt. Normal bowel sounds heard. Central nervous system: Alert and oriented. No focal neurological deficits. Extremities: Symmetric 5 x 5 power. Skin: No rashes, lesions or ulcers.  Psychiatry: Judgement and insight appear normal. Mood & affect appropriate.    Data Reviewed: I have personally reviewed following labs and imaging studies  CBC: Recent Labs  Lab 11/02/19 0920 11/03/19 0539 11/04/19 0421  WBC 19.9* 19.2* 13.4*  NEUTROABS  --   --  10.1*  HGB 16.6 14.3 14.2  HCT 49.1 42.9 44.1  MCV 82.0 82.8 84.2  PLT 248 208 208   Basic Metabolic Panel: Recent Labs  Lab 11/02/19 0920 11/03/19 0539 11/04/19 0421  NA 136 134* 138  K 4.0 3.4* 3.6  CL 98 97* 99  CO2 25 25 29   GLUCOSE 119* 100* 93  BUN 15 10 9   CREATININE 0.73 0.61 0.70  CALCIUM 9.8 9.2 9.3   GFR: Estimated Creatinine Clearance: 151.8 mL/min (by C-G formula based on SCr of 0.7 mg/dL). Liver Function Tests: Recent Labs  Lab 11/02/19 0920 11/03/19 0539 11/04/19 0421  AST 21 16 15   ALT 27 19 19   ALKPHOS 91 73 80  BILITOT 1.3* 1.5* 0.8  PROT 8.1 6.8 6.7  ALBUMIN 4.2 3.6 3.4*   Recent Labs  Lab 11/02/19 0920  LIPASE 144*   No results  for input(s): AMMONIA in the last 168 hours. Coagulation Profile: No results for input(s): INR, PROTIME in the last 168 hours. Cardiac Enzymes: No results for input(s): CKTOTAL, CKMB, CKMBINDEX, TROPONINI in the last 168 hours. BNP (last 3 results) No results for input(s): PROBNP in the last 8760 hours. HbA1C: No results for input(s): HGBA1C in the last 72 hours. CBG: No results for input(s): GLUCAP in the last 168 hours. Lipid Profile: Recent Labs    11/03/19 1254  TRIG 113   Thyroid Function Tests: No results for input(s): TSH, T4TOTAL, FREET4, T3FREE, THYROIDAB in the last 72 hours. Anemia Panel: No results for input(s): VITAMINB12, FOLATE, FERRITIN, TIBC, IRON, RETICCTPCT in  the last 72 hours. Sepsis Labs: No results for input(s): PROCALCITON, LATICACIDVEN in the last 168 hours.  Recent Results (from the past 240 hour(s))  SARS Coronavirus 2 by RT PCR (hospital order, performed in [facility] hospital lab) Nasopharyngeal Nasopharyngeal Swab     Status: None   Collection Time: 11/02/19  1:49 PM   Specimen: Nasopharyngeal Swab  Result Value Ref Range Status   SARS Coronavirus 2 NEGATIVE NEGATIVE Final    Comment: (NOTE) SARS-CoV-2 target nucleic acids are NOT DETECTED.  The SARS-CoV-2 RNA is generally detectable in upper and lower respiratory specimens during the acute phase of infection. The lowest concentration of SARS-CoV-2 viral copies this assay can detect is 250 copies / mL. A negative result does not preclude SARS-CoV-2 infection and should not be used as the sole basis for treatment or other patient management decisions.  A negative result may occur with improper specimen collection / handling, submission of specimen other than nasopharyngeal swab, presence of viral mutation(s) within the areas targeted by this assay, and inadequate number of viral copies (<250 copies / mL). A negative result must be combined with clinical observations, patient history, and  epidemiological information.  Fact Sheet for Patients:   BoilerBrush.com.cy  Fact Sheet for Healthcare Providers: https://pope.com/  This test is not yet approved or  cleared by the [place] FDA and has been authorized for detection and/or diagnosis of SARS-CoV-2 by FDA under an Emergency Use Authorization (EUA).  This EUA will remain in effect (meaning this test can be used) for the duration of the COVID-19 declaration under Section 564(b)(1) of the Act, 21 U.S.C. section 360bbb-3(b)(1), unless the authorization is terminated or revoked sooner.  Performed at [facility], [address], [place], [place] [address]       Radiology Studies: US Abdomen Limited RUQ  Result Date: 11/02/2019 CLINICAL DATA:  Abdominal pain with evidence of pancreatitis on recent CT EXAM: ULTRASOUND ABDOMEN LIMITED RIGHT UPPER QUADRANT COMPARISON:  CT abdomen and pelvis November 02, 2019 FINDINGS: Gallbladder: No gallstones or wall thickening visualized. There is no pericholecystic fluid. No sonographic Murphy sign noted by sonographer. Common bile duct: Diameter: 3 mm. No intrahepatic or extrahepatic biliary duct dilatation. Liver: No focal lesion identified. Within normal limits in parenchymal echogenicity. Portal vein is patent on color Doppler imaging with normal direction of blood flow towards the liver. Other: None. IMPRESSION: Study within normal limits. Electronically Signed   By: Bretta Bang III M.D.   On: 11/02/2019 16:19    Scheduled Meds: . enoxaparin (LOVENOX) injection  60 mg Subcutaneous Q24H  . sodium chloride flush  3 mL Intravenous Q12H  . Testosterone  30 mcg Transdermal q morning - 10a   Continuous Infusions: . lactated ringers 75 mL/hr at 11/04/19 1040     LOS: 2 days   Time spent: 30 minutes   Hughie Closs, MD [facility]  11/04/2019, 1:55 PM   To contact the attending provider between  7A-7P or the covering provider during after hours 7P-7A, please log into the web site [email or web address].

## 2019-11-04 NOTE — Progress Notes (Signed)
[facility] Progress Note  Subjective: The patient is feeling better today.  Still has some abdominal pain but less.  His diet has been advanced to full liquids.  MRCP reviewed.  There is evidence of acute nonnecrotizing pancreatitis but no obvious etiology such as gallstones or pancreas divisum.  Objective: Vital signs in last 24 hours: Temp:  [98.3 F (36.8 C)-98.7 F (37.1 C)] 98.3 F (36.8 C) (09/13 1419) Pulse Rate:  [82-88] 86 (09/13 1419) Resp:  [16-20] 18 (09/13 1419) BP: (127-155)/(79-100) 142/100 (09/13 1419) SpO2:  [96 %-99 %] 99 % (09/13 1419) Weight change:    PE:  No distress  Abdomen is soft with some tenderness in the epigastrium  Lab Results: Results for orders placed or performed during the hospital encounter of 11/02/19 (from the past 24 hour(s))  CBC with Differential/Platelet     Status: Abnormal   Collection Time: 11/04/19  4:21 AM  Result Value Ref Range   WBC 13.4 (H) 4.0 - 10.5 K/uL   RBC 5.24 4.22 - 5.81 MIL/uL   Hemoglobin 14.2 13.0 - 17.0 g/dL   HCT 99.2 39 - 52 %   MCV 84.2 80.0 - 100.0 fL   MCH 27.1 26.0 - 34.0 pg   MCHC 32.2 30.0 - 36.0 g/dL   RDW 42.6 83.4 - 19.6 %   Platelets 208 150 - 400 K/uL   nRBC 0.0 0.0 - 0.2 %   Neutrophils Relative % 76 %   Neutro Abs 10.1 (H) 1.7 - 7.7 K/uL   Lymphocytes Relative 13 %   Lymphs Abs 1.8 0.7 - 4.0 K/uL   Monocytes Relative 7 %   Monocytes Absolute 1.0 0 - 1 K/uL   Eosinophils Relative 3 %   Eosinophils Absolute 0.4 0 - 0 K/uL   Basophils Relative 0 %   Basophils Absolute 0.1 0 - 0 K/uL   Immature Granulocytes 1 %   Abs Immature Granulocytes 0.11 (H) 0.00 - 0.07 K/uL  Comprehensive metabolic panel     Status: Abnormal   Collection Time: 11/04/19  4:21 AM  Result Value Ref Range   Sodium 138 135 - 145 mmol/L   Potassium 3.6 3.5 - 5.1 mmol/L   Chloride 99 98 - 111 mmol/L   CO2 29 22 - 32 mmol/L   Glucose, Bld 93 70 - 99 mg/dL   BUN 9 6 - 20 mg/dL   Creatinine, Ser 2.22 0.61 -  1.24 mg/dL   Calcium 9.3 8.9 - 97.9 mg/dL   Total Protein 6.7 6.5 - 8.1 g/dL   Albumin 3.4 (L) 3.5 - 5.0 g/dL   AST 15 15 - 41 U/L   ALT 19 0 - 44 U/L   Alkaline Phosphatase 80 38 - 126 U/L   Total Bilirubin 0.8 0.3 - 1.2 mg/dL   GFR calc non Af Amer >60 >60 mL/min   GFR calc Af Amer >60 >60 mL/min   Anion gap 10 5 - 15    Studies/Results: MR 3D Recon At Scanner  Result Date: 11/04/2019 CLINICAL DATA:  Inpatient. Recurrent acute pancreatitis of uncertain etiology. Abdominal pain. EXAM: MRI ABDOMEN WITHOUT AND WITH CONTRAST (INCLUDING MRCP) TECHNIQUE: Multiplanar multisequence MR imaging of the abdomen was performed both before and after the administration of intravenous contrast. Heavily T2-weighted images of the biliary and pancreatic ducts were obtained, and three-dimensional MRCP images were rendered by post processing. CONTRAST:  14mL GADAVIST GADOBUTROL 1 MMOL/ML IV SOLN COMPARISON:  11/02/2019 CT abdomen/pelvis and abdominal sonogram. FINDINGS: Lower chest: Mild  dependent bibasilar atelectasis. Hepatobiliary: Normal liver size and configuration. No hepatic steatosis. No liver mass. Normal gallbladder with no cholelithiasis. No biliary ductal dilatation. Common bile duct diameter 2 mm. No choledocholithiasis. No biliary masses, strictures or beading. Pancreas: There is thickening and mild parenchymal edema in the pancreatic head and neck with mild peripancreatic edema compatible with acute pancreatitis. Preserved pancreatic parenchymal enhancement. No discrete pancreatic mass or pancreatic duct dilation. No definite evidence of pancreas divisum. Spleen: Normal size. No mass. Adrenals/Urinary Tract: Normal adrenals. No hydronephrosis. Subcentimeter renal cortical cysts in the upper right and lower left kidneys. No suspicious renal masses. Stomach/Bowel: Normal non-distended stomach. Visualized small and large bowel is normal caliber, with no bowel wall thickening. Vascular/Lymphatic: Normal  caliber abdominal aorta. Patent portal, splenic, hepatic and renal veins. Mildly enlarged 1.2 cm peripancreatic node near the porta hepatis (series 29/image 52). No additional pathologically enlarged abdominal lymph nodes. Other: No abdominal ascites or focal fluid collection. Musculoskeletal: No aggressive appearing focal osseous lesions. IMPRESSION: 1. Acute non-necrotizing pancreatitis involving the pancreatic head and neck. No discrete pancreatic mass or pancreatic duct dilation. 2. No cholelithiasis or choledocholithiasis. No biliary ductal dilatation. 3. Mildly enlarged peripancreatic node near the porta hepatis, nonspecific, probably reactive. Electronically Signed   By: Delbert Phenix M.D.   On: 11/04/2019 14:14   MR ABDOMEN MRCP W WO CONTAST  Result Date: 11/04/2019 CLINICAL DATA:  Inpatient. Recurrent acute pancreatitis of uncertain etiology. Abdominal pain. EXAM: MRI ABDOMEN WITHOUT AND WITH CONTRAST (INCLUDING MRCP) TECHNIQUE: Multiplanar multisequence MR imaging of the abdomen was performed both before and after the administration of intravenous contrast. Heavily T2-weighted images of the biliary and pancreatic ducts were obtained, and three-dimensional MRCP images were rendered by post processing. CONTRAST:  46mL GADAVIST GADOBUTROL 1 MMOL/ML IV SOLN COMPARISON:  11/02/2019 CT abdomen/pelvis and abdominal sonogram. FINDINGS: Lower chest: Mild dependent bibasilar atelectasis. Hepatobiliary: Normal liver size and configuration. No hepatic steatosis. No liver mass. Normal gallbladder with no cholelithiasis. No biliary ductal dilatation. Common bile duct diameter 2 mm. No choledocholithiasis. No biliary masses, strictures or beading. Pancreas: There is thickening and mild parenchymal edema in the pancreatic head and neck with mild peripancreatic edema compatible with acute pancreatitis. Preserved pancreatic parenchymal enhancement. No discrete pancreatic mass or pancreatic duct dilation. No definite  evidence of pancreas divisum. Spleen: Normal size. No mass. Adrenals/Urinary Tract: Normal adrenals. No hydronephrosis. Subcentimeter renal cortical cysts in the upper right and lower left kidneys. No suspicious renal masses. Stomach/Bowel: Normal non-distended stomach. Visualized small and large bowel is normal caliber, with no bowel wall thickening. Vascular/Lymphatic: Normal caliber abdominal aorta. Patent portal, splenic, hepatic and renal veins. Mildly enlarged 1.2 cm peripancreatic node near the porta hepatis (series 29/image 52). No additional pathologically enlarged abdominal lymph nodes. Other: No abdominal ascites or focal fluid collection. Musculoskeletal: No aggressive appearing focal osseous lesions. IMPRESSION: 1. Acute non-necrotizing pancreatitis involving the pancreatic head and neck. No discrete pancreatic mass or pancreatic duct dilation. 2. No cholelithiasis or choledocholithiasis. No biliary ductal dilatation. 3. Mildly enlarged peripancreatic node near the porta hepatis, nonspecific, probably reactive. Electronically Signed   By: Delbert Phenix M.D.   On: 11/04/2019 14:14      Assessment: Acute pancreatitis could be related to alcohol.  Plan:   Continue current management    Gwenevere Abbot 11/04/2019, 2:51 PM  Pager: [phone] If no answer or after 5 PM call [phone]

## 2019-11-04 NOTE — Plan of Care (Signed)

## 2019-11-05 LAB — CBC WITH DIFFERENTIAL/PLATELET
Abs Immature Granulocytes: 0.07 10*3/uL (ref 0.00–0.07)
Basophils Absolute: 0 10*3/uL (ref 0.0–0.1)
Basophils Relative: 0 %
Eosinophils Absolute: 0.4 10*3/uL (ref 0.0–0.5)
Eosinophils Relative: 4 %
HCT: 44.8 % (ref 39.0–52.0)
Hemoglobin: 14.3 g/dL (ref 13.0–17.0)
Immature Granulocytes: 1 %
Lymphocytes Relative: 19 %
Lymphs Abs: 1.7 10*3/uL (ref 0.7–4.0)
MCH: 27 pg (ref 26.0–34.0)
MCHC: 31.9 g/dL (ref 30.0–36.0)
MCV: 84.5 fL (ref 80.0–100.0)
Monocytes Absolute: 0.8 10*3/uL (ref 0.1–1.0)
Monocytes Relative: 9 %
Neutro Abs: 6.1 10*3/uL (ref 1.7–7.7)
Neutrophils Relative %: 67 %
Platelets: 217 10*3/uL (ref 150–400)
RBC: 5.3 MIL/uL (ref 4.22–5.81)
RDW: 12.4 % (ref 11.5–15.5)
WBC: 9.2 10*3/uL (ref 4.0–10.5)
nRBC: 0 % (ref 0.0–0.2)

## 2019-11-05 LAB — COMPREHENSIVE METABOLIC PANEL
ALT: 22 U/L (ref 0–44)
AST: 17 U/L (ref 15–41)
Albumin: 3.3 g/dL — ABNORMAL LOW (ref 3.5–5.0)
Alkaline Phosphatase: 71 U/L (ref 38–126)
Anion gap: 9 (ref 5–15)
BUN: 12 mg/dL (ref 6–20)
CO2: 29 mmol/L (ref 22–32)
Calcium: 9.1 mg/dL (ref 8.9–10.3)
Chloride: 97 mmol/L — ABNORMAL LOW (ref 98–111)
Creatinine, Ser: 0.93 mg/dL (ref 0.61–1.24)
GFR calc Af Amer: >60 mL/min (ref >60)
GFR calc non Af Amer: >60 mL/min (ref >60)
Glucose, Bld: 100 mg/dL — ABNORMAL HIGH (ref 70–99)
Potassium: 3.5 mmol/L (ref 3.5–5.1)
Sodium: 135 mmol/L (ref 135–145)
Total Bilirubin: 0.5 mg/dL (ref 0.3–1.2)
Total Protein: 6.5 g/dL (ref 6.5–8.1)

## 2019-11-05 NOTE — Progress Notes (Signed)
PROGRESS NOTE    Taylor Kidd  BDZ:[id number] DOB: [birth year] DOA: 11/02/2019 PCP: Sharon Seller, NP   Brief Narrative:  Taylor Kidd is a 49 y.o. male with history of chronic low back pain, GERD, and recent admission for idiopathic pancreatitis, presented with abdominal pain.  Patient recently admitted from December 24-30, 2020, for first episode of acute pancreatitis.  At that time etiology not clear, minimal alcohol intake and triglycerides sixty-seven on admission.  This time he is admitted with history of abdominal pain of 2-day duration.  Located in epigastric.  Radiating to the back.  Associate with some nausea and vomiting. Denies regular alcohol use, though does endorse binge drinking of 6-8 beers occasionally on the weekends when he goes golfing though not every weekend.  In the ED initial vital signs notable only for hypertension with blood pressures ranging 140s to 160s over 80s to 90s.  Lab work-up notable for unremarkable CMP, CBC with white count elevated at 19.9, and lipase of 144.  CT abdomen and pelvis was obtained which showed acute pancreatitis, no cholelithiasis.  He was given IV fluids, and nausea meds.  Admitted under hospitalist service.  GI consulted.  Underwent MRCP which shows nonnecrotizing pancreatitis and otherwise unremarkable.  Feeling better now.  Advancing diet.   Assessment & Plan:   Active Problems:   GERD   LOW BACK PAIN, CHRONIC   Pancreatitis   #Acute pancreatitis: Patient presenting with second episode of acute pancreatitis of unclear etiology.  Triglycerides normal on last admission.  Triglyceride level pending this admission.  Ultrasound abdomen negative for gallstones.  Endorses occasional binge drinking.  He is also on atorvastatin for few years.  His cause could be alcohol induced, medication induced or idiopathic.  Pain is much improved today.  Tolerating full liquid diet well.  MRCP shows nonnecrotizing acute pancreatitis.  Otherwise  unremarkable.  Discussed about advancing diet to soft.  He is apprehensive but agreeable.  Will advance to soft and advance as tolerated to regular.  GI on board.  Appreciate their help.  #Leukocytosis: Marked leukocytosis with white count of 19.9 on admission, however otherwise no signs of infection or evidence of abscess on CT abdomen pelvis.  Likely reactive in setting of his pancreatitis, now resolved  #SVT Per ED nurse patient had spontaneous episode of SVT that lasted approximately 2 to 3 minutes and resolved with Valsalva maneuver.  Patient reported palpitations during that time but was otherwise hemodynamically stable, no further episodes as such reported.  Hypokalemia: Resolved  #Chronic medical problems Low testosterone: Continue testosterone supplement Hyperlipidemia: Hold atorvastatin  Chronic low back pain: Hold p.o. tramadol  DVT prophylaxis:    Code Status: Full Code  Family Communication: None present at bedside.  Plan of care discussed with patient in length and he verbalized understanding and agreed with it.  Status is: Inpatient  Remains inpatient appropriate because:IV treatments appropriate due to intensity of illness or inability to take PO   Dispo: The patient is from: Home              Anticipated d/c is to: Home              Anticipated d/c date is: 1 to 2 days              Patient currently is not medically stable to d/c.        Estimated body mass index is 31.25 kg/m as calculated from the following:   Height as of this encounter: 6'  3" (1.905 m).   Weight as of this encounter: 113.4 kg.      Nutritional status:               Consultants:   GI  Procedures:   None  Antimicrobials:  Anti-infectives (From admission, onward)   None         Subjective: Seen and examined.  Wife at the bedside.  He states that his abdominal pain has improved significantly.  No nausea.  Tolerating full liquid diet.  Objective: Vitals:    11/04/19 0436 11/04/19 1419 11/04/19 2250 11/05/19 0529  BP: (!) 153/99 (!) 142/100 116/83 133/90  Pulse: 82 86 80 77  Resp: 20 18 18 16   Temp: 98.5 F (36.9 C) 98.3 F (36.8 C) 98.6 F (37 C) 98 F (36.7 C)  TempSrc: Oral Oral Oral Oral  SpO2: 96% 99% 97% 98%  Weight:      Height:        Intake/Output Summary (Last 24 hours) at 11/05/2019 1103 Last data filed at 11/05/2019 1000 Gross per 24 hour  Intake 1687.46 ml  Output --  Net 1687.46 ml   Filed Weights   11/02/19 0826  Weight: 113.4 kg    Examination:  General exam: Appears calm and comfortable  Respiratory system: Clear to auscultation. Respiratory effort normal. Cardiovascular system: S1 & S2 heard, RRR. No JVD, murmurs, rubs, gallops or clicks. No pedal edema. Gastrointestinal system: Abdomen is nondistended, soft and mild epigastric fullness and tenderness. No organomegaly or masses felt. Normal bowel sounds heard. Central nervous system: Alert and oriented. No focal neurological deficits. Extremities: Symmetric 5 x 5 power. Skin: No rashes, lesions or ulcers.  Psychiatry: Judgement and insight appear normal. Mood & affect appropriate.     Data Reviewed: I have personally reviewed following labs and imaging studies  CBC: Recent Labs  Lab 11/02/19 0920 11/03/19 0539 11/04/19 0421 11/05/19 0508  WBC 19.9* 19.2* 13.4* 9.2  NEUTROABS  --   --  10.1* 6.1  HGB 16.6 14.3 14.2 14.3  HCT 49.1 42.9 44.1 44.8  MCV 82.0 82.8 84.2 84.5  PLT 248 208 208 217   Basic Metabolic Panel: Recent Labs  Lab 11/02/19 0920 11/03/19 0539 11/04/19 0421 11/05/19 0508  NA 136 134* 138 135  K 4.0 3.4* 3.6 3.5  CL 98 97* 99 97*  CO2 25 25 29 29   GLUCOSE 119* 100* 93 100*  BUN 15 10 9 12   CREATININE 0.73 0.61 0.70 0.93  CALCIUM 9.8 9.2 9.3 9.1   GFR: Estimated Creatinine Clearance: 130.6 mL/min (by C-G formula based on SCr of 0.93 mg/dL). Liver Function Tests: Recent Labs  Lab 11/02/19 0920 11/03/19 0539  11/04/19 0421 11/05/19 0508  AST 21 16 15 17   ALT 27 19 19 22   ALKPHOS 91 73 80 71  BILITOT 1.3* 1.5* 0.8 0.5  PROT 8.1 6.8 6.7 6.5  ALBUMIN 4.2 3.6 3.4* 3.3*   Recent Labs  Lab 11/02/19 0920  LIPASE 144*   No results for input(s): AMMONIA in the last 168 hours. Coagulation Profile: No results for input(s): INR, PROTIME in the last 168 hours. Cardiac Enzymes: No results for input(s): CKTOTAL, CKMB, CKMBINDEX, TROPONINI in the last 168 hours. BNP (last 3 results) No results for input(s): PROBNP in the last 8760 hours. HbA1C: No results for input(s): HGBA1C in the last 72 hours. CBG: No results for input(s): GLUCAP in the last 168 hours. Lipid Profile: Recent Labs    11/03/19 1254  TRIG 113  Thyroid Function Tests: No results for input(s): TSH, T4TOTAL, FREET4, T3FREE, THYROIDAB in the last 72 hours. Anemia Panel: No results for input(s): VITAMINB12, FOLATE, FERRITIN, TIBC, IRON, RETICCTPCT in the last 72 hours. Sepsis Labs: No results for input(s): PROCALCITON, LATICACIDVEN in the last 168 hours.  Recent Results (from the past 240 hour(s))  SARS Coronavirus 2 by RT PCR (hospital order, performed [facility]) Nasopharyngeal Nasopharyngeal Swab     Status: None   Collection Time: 11/02/19  1:49 PM   Specimen: Nasopharyngeal Swab  Result Value Ref Range Status   SARS Coronavirus 2 NEGATIVE NEGATIVE Final    Comment: (NOTE) SARS-CoV-2 target nucleic acids are NOT DETECTED.  The SARS-CoV-2 RNA is generally detectable in upper and lower respiratory specimens during the acute phase of infection. The lowest concentration of SARS-CoV-2 viral copies this assay can detect is 250 copies / mL. A negative result does not preclude SARS-CoV-2 infection and should not be used as the sole basis for treatment or other patient management decisions.  A negative result may occur with improper specimen collection / handling, submission of specimen other than  nasopharyngeal swab, presence of viral mutation(s) within the areas targeted by this assay, and inadequate number of viral copies (<250 copies / mL). A negative result must be combined with clinical observations, patient history, and epidemiological information.  Fact Sheet for Patients:   BoilerBrush.com.cyhttps://www.fda.gov/media/136312/download  Fact Sheet for Healthcare Providers: https://pope.com/https://www.fda.gov/media/136313/download  This test is not yet approved or  cleared by the [place] FDA and has been authorized for detection and/or diagnosis of SARS-CoV-2 by FDA under an Emergency Use Authorization (EUA).  This EUA will remain in effect (meaning this test can be used) for the duration of the COVID-19 declaration under Section 564(b)(1) of the Act, 21 U.S.C. section 360bbb-3(b)(1), unless the authorization is terminated or revoked sooner.  Performed at [facility], [address] [place], [place] [address]       Radiology Studies: MR 3D Recon At Scanner  Result Date: 11/04/2019 CLINICAL DATA:  Inpatient. Recurrent acute pancreatitis of uncertain etiology. Abdominal pain. EXAM: MRI ABDOMEN WITHOUT AND WITH CONTRAST (INCLUDING MRCP) TECHNIQUE: Multiplanar multisequence MR imaging of the abdomen was performed both before and after the administration of intravenous contrast. Heavily T2-weighted images of the biliary and pancreatic ducts were obtained, and three-dimensional MRCP images were rendered by post processing. CONTRAST:  10mL GADAVIST GADOBUTROL 1 MMOL/ML IV SOLN COMPARISON:  11/02/2019 CT abdomen/pelvis and abdominal sonogram. FINDINGS: Lower chest: Mild dependent bibasilar atelectasis. Hepatobiliary: Normal liver size and configuration. No hepatic steatosis. No liver mass. Normal gallbladder with no cholelithiasis. No biliary ductal dilatation. Common bile duct diameter 2 mm. No choledocholithiasis. No biliary masses, strictures or beading. Pancreas: There is thickening and  mild parenchymal edema in the pancreatic head and neck with mild peripancreatic edema compatible with acute pancreatitis. Preserved pancreatic parenchymal enhancement. No discrete pancreatic mass or pancreatic duct dilation. No definite evidence of pancreas divisum. Spleen: Normal size. No mass. Adrenals/Urinary Tract: Normal adrenals. No hydronephrosis. Subcentimeter renal cortical cysts in the upper right and lower left kidneys. No suspicious renal masses. Stomach/Bowel: Normal non-distended stomach. Visualized small and large bowel is normal caliber, with no bowel wall thickening. Vascular/Lymphatic: Normal caliber abdominal aorta. Patent portal, splenic, hepatic and renal veins. Mildly enlarged 1.2 cm peripancreatic node near the porta hepatis (series 29/image 52). No additional pathologically enlarged abdominal lymph nodes. Other: No abdominal ascites or focal fluid collection. Musculoskeletal: No aggressive appearing focal osseous lesions.  IMPRESSION: 1. Acute non-necrotizing pancreatitis involving the pancreatic head and neck. No discrete pancreatic mass or pancreatic duct dilation. 2. No cholelithiasis or choledocholithiasis. No biliary ductal dilatation. 3. Mildly enlarged peripancreatic node near the porta hepatis, nonspecific, probably reactive. Electronically Signed   By: Delbert Phenix M.D.   On: 11/04/2019 14:14   MR ABDOMEN MRCP W WO CONTAST  Result Date: 11/04/2019 CLINICAL DATA:  Inpatient. Recurrent acute pancreatitis of uncertain etiology. Abdominal pain. EXAM: MRI ABDOMEN WITHOUT AND WITH CONTRAST (INCLUDING MRCP) TECHNIQUE: Multiplanar multisequence MR imaging of the abdomen was performed both before and after the administration of intravenous contrast. Heavily T2-weighted images of the biliary and pancreatic ducts were obtained, and three-dimensional MRCP images were rendered by post processing. CONTRAST:  62mL GADAVIST GADOBUTROL 1 MMOL/ML IV SOLN COMPARISON:  11/02/2019 CT abdomen/pelvis  and abdominal sonogram. FINDINGS: Lower chest: Mild dependent bibasilar atelectasis. Hepatobiliary: Normal liver size and configuration. No hepatic steatosis. No liver mass. Normal gallbladder with no cholelithiasis. No biliary ductal dilatation. Common bile duct diameter 2 mm. No choledocholithiasis. No biliary masses, strictures or beading. Pancreas: There is thickening and mild parenchymal edema in the pancreatic head and neck with mild peripancreatic edema compatible with acute pancreatitis. Preserved pancreatic parenchymal enhancement. No discrete pancreatic mass or pancreatic duct dilation. No definite evidence of pancreas divisum. Spleen: Normal size. No mass. Adrenals/Urinary Tract: Normal adrenals. No hydronephrosis. Subcentimeter renal cortical cysts in the upper right and lower left kidneys. No suspicious renal masses. Stomach/Bowel: Normal non-distended stomach. Visualized small and large bowel is normal caliber, with no bowel wall thickening. Vascular/Lymphatic: Normal caliber abdominal aorta. Patent portal, splenic, hepatic and renal veins. Mildly enlarged 1.2 cm peripancreatic node near the porta hepatis (series 29/image 52). No additional pathologically enlarged abdominal lymph nodes. Other: No abdominal ascites or focal fluid collection. Musculoskeletal: No aggressive appearing focal osseous lesions. IMPRESSION: 1. Acute non-necrotizing pancreatitis involving the pancreatic head and neck. No discrete pancreatic mass or pancreatic duct dilation. 2. No cholelithiasis or choledocholithiasis. No biliary ductal dilatation. 3. Mildly enlarged peripancreatic node near the porta hepatis, nonspecific, probably reactive. Electronically Signed   By: Delbert Phenix M.D.   On: 11/04/2019 14:14    Scheduled Meds: . enoxaparin (LOVENOX) injection  60 mg Subcutaneous Q24H  . sodium chloride flush  3 mL Intravenous Q12H  . Testosterone  30 mcg Transdermal q morning - 10a   Continuous Infusions: . lactated  ringers 75 mL/hr at 11/05/19 0700     LOS: 3 days   Time spent: 28 minutes   Hughie Closs, MD [facility]  11/05/2019, 11:03 AM   To contact the attending provider between 7A-7P or the covering provider during after hours 7P-7A, please log into the web site [email or web address].

## 2019-11-05 NOTE — Progress Notes (Signed)
[facility] Progress Note  Subjective: He is feeling better today.  Less abdominal pain.  No nausea or vomiting.  Objective: Vital signs in last 24 hours: Temp:  [98 F (36.7 C)-98.6 F (37 C)] 98 F (36.7 C) (09/14 0529) Pulse Rate:  [77-86] 77 (09/14 0529) Resp:  [16-18] 16 (09/14 0529) BP: (116-142)/(83-100) 133/90 (09/14 0529) SpO2:  [97 %-99 %] 98 % (09/14 0529) Weight change:    PE:  No distress  Abdomen soft minimal epigastric tenderness  Lab Results: Results for orders placed or performed during the hospital encounter of 11/02/19 (from the past 24 hour(s))  CBC with Differential/Platelet     Status: None   Collection Time: 11/05/19  5:08 AM  Result Value Ref Range   WBC 9.2 4.0 - 10.5 K/uL   RBC 5.30 4.22 - 5.81 MIL/uL   Hemoglobin 14.3 13.0 - 17.0 g/dL   HCT 62.2 39 - 52 %   MCV 84.5 80.0 - 100.0 fL   MCH 27.0 26.0 - 34.0 pg   MCHC 31.9 30.0 - 36.0 g/dL   RDW 29.7 98.9 - 21.1 %   Platelets 217 150 - 400 K/uL   nRBC 0.0 0.0 - 0.2 %   Neutrophils Relative % 67 %   Neutro Abs 6.1 1.7 - 7.7 K/uL   Lymphocytes Relative 19 %   Lymphs Abs 1.7 0.7 - 4.0 K/uL   Monocytes Relative 9 %   Monocytes Absolute 0.8 0 - 1 K/uL   Eosinophils Relative 4 %   Eosinophils Absolute 0.4 0 - 0 K/uL   Basophils Relative 0 %   Basophils Absolute 0.0 0 - 0 K/uL   Immature Granulocytes 1 %   Abs Immature Granulocytes 0.07 0.00 - 0.07 K/uL  Comprehensive metabolic panel     Status: Abnormal   Collection Time: 11/05/19  5:08 AM  Result Value Ref Range   Sodium 135 135 - 145 mmol/L   Potassium 3.5 3.5 - 5.1 mmol/L   Chloride 97 (L) 98 - 111 mmol/L   CO2 29 22 - 32 mmol/L   Glucose, Bld 100 (H) 70 - 99 mg/dL   BUN 12 6 - 20 mg/dL   Creatinine, Ser 9.41 0.61 - 1.24 mg/dL   Calcium 9.1 8.9 - 74.0 mg/dL   Total Protein 6.5 6.5 - 8.1 g/dL   Albumin 3.3 (L) 3.5 - 5.0 g/dL   AST 17 15 - 41 U/L   ALT 22 0 - 44 U/L   Alkaline Phosphatase 71 38 - 126 U/L   Total Bilirubin  0.5 0.3 - 1.2 mg/dL   GFR calc non Af Amer >60 >60 mL/min   GFR calc Af Amer >60 >60 mL/min   Anion gap 9 5 - 15    Studies/Results: MR 3D Recon At Scanner  Result Date: 11/04/2019 CLINICAL DATA:  Inpatient. Recurrent acute pancreatitis of uncertain etiology. Abdominal pain. EXAM: MRI ABDOMEN WITHOUT AND WITH CONTRAST (INCLUDING MRCP) TECHNIQUE: Multiplanar multisequence MR imaging of the abdomen was performed both before and after the administration of intravenous contrast. Heavily T2-weighted images of the biliary and pancreatic ducts were obtained, and three-dimensional MRCP images were rendered by post processing. CONTRAST:  10mL GADAVIST GADOBUTROL 1 MMOL/ML IV SOLN COMPARISON:  11/02/2019 CT abdomen/pelvis and abdominal sonogram. FINDINGS: Lower chest: Mild dependent bibasilar atelectasis. Hepatobiliary: Normal liver size and configuration. No hepatic steatosis. No liver mass. Normal gallbladder with no cholelithiasis. No biliary ductal dilatation. Common bile duct diameter 2 mm. No choledocholithiasis. No biliary masses,  strictures or beading. Pancreas: There is thickening and mild parenchymal edema in the pancreatic head and neck with mild peripancreatic edema compatible with acute pancreatitis. Preserved pancreatic parenchymal enhancement. No discrete pancreatic mass or pancreatic duct dilation. No definite evidence of pancreas divisum. Spleen: Normal size. No mass. Adrenals/Urinary Tract: Normal adrenals. No hydronephrosis. Subcentimeter renal cortical cysts in the upper right and lower left kidneys. No suspicious renal masses. Stomach/Bowel: Normal non-distended stomach. Visualized small and large bowel is normal caliber, with no bowel wall thickening. Vascular/Lymphatic: Normal caliber abdominal aorta. Patent portal, splenic, hepatic and renal veins. Mildly enlarged 1.2 cm peripancreatic node near the porta hepatis (series 29/image 52). No additional pathologically enlarged abdominal lymph  nodes. Other: No abdominal ascites or focal fluid collection. Musculoskeletal: No aggressive appearing focal osseous lesions. IMPRESSION: 1. Acute non-necrotizing pancreatitis involving the pancreatic head and neck. No discrete pancreatic mass or pancreatic duct dilation. 2. No cholelithiasis or choledocholithiasis. No biliary ductal dilatation. 3. Mildly enlarged peripancreatic node near the porta hepatis, nonspecific, probably reactive. Electronically Signed   By: Delbert Phenix M.D.   On: 11/04/2019 14:14   MR ABDOMEN MRCP W WO CONTAST  Result Date: 11/04/2019 CLINICAL DATA:  Inpatient. Recurrent acute pancreatitis of uncertain etiology. Abdominal pain. EXAM: MRI ABDOMEN WITHOUT AND WITH CONTRAST (INCLUDING MRCP) TECHNIQUE: Multiplanar multisequence MR imaging of the abdomen was performed both before and after the administration of intravenous contrast. Heavily T2-weighted images of the biliary and pancreatic ducts were obtained, and three-dimensional MRCP images were rendered by post processing. CONTRAST:  76mL GADAVIST GADOBUTROL 1 MMOL/ML IV SOLN COMPARISON:  11/02/2019 CT abdomen/pelvis and abdominal sonogram. FINDINGS: Lower chest: Mild dependent bibasilar atelectasis. Hepatobiliary: Normal liver size and configuration. No hepatic steatosis. No liver mass. Normal gallbladder with no cholelithiasis. No biliary ductal dilatation. Common bile duct diameter 2 mm. No choledocholithiasis. No biliary masses, strictures or beading. Pancreas: There is thickening and mild parenchymal edema in the pancreatic head and neck with mild peripancreatic edema compatible with acute pancreatitis. Preserved pancreatic parenchymal enhancement. No discrete pancreatic mass or pancreatic duct dilation. No definite evidence of pancreas divisum. Spleen: Normal size. No mass. Adrenals/Urinary Tract: Normal adrenals. No hydronephrosis. Subcentimeter renal cortical cysts in the upper right and lower left kidneys. No suspicious renal  masses. Stomach/Bowel: Normal non-distended stomach. Visualized small and large bowel is normal caliber, with no bowel wall thickening. Vascular/Lymphatic: Normal caliber abdominal aorta. Patent portal, splenic, hepatic and renal veins. Mildly enlarged 1.2 cm peripancreatic node near the porta hepatis (series 29/image 52). No additional pathologically enlarged abdominal lymph nodes. Other: No abdominal ascites or focal fluid collection. Musculoskeletal: No aggressive appearing focal osseous lesions. IMPRESSION: 1. Acute non-necrotizing pancreatitis involving the pancreatic head and neck. No discrete pancreatic mass or pancreatic duct dilation. 2. No cholelithiasis or choledocholithiasis. No biliary ductal dilatation. 3. Mildly enlarged peripancreatic node near the porta hepatis, nonspecific, probably reactive. Electronically Signed   By: Delbert Phenix M.D.   On: 11/04/2019 14:14      Assessment: Acute pancreatitis.  Suspect alcohol is the cause.  Seems to be improving daily.  Plan:   Continue supportive care as you are currently doing.  Advance diet as tolerated.  Hopefully home soon.  We will sign off.  Call us if needed.    Gwenevere Abbot 11/05/2019, 12:45 PM  Pager: [phone] If no answer or after 5 PM call [phone]

## 2019-11-06 DIAGNOSIS — E291 Testicular hypofunction: Secondary | ICD-10-CM

## 2019-11-06 DIAGNOSIS — G894 Chronic pain syndrome: Secondary | ICD-10-CM

## 2019-11-06 DIAGNOSIS — I471 Supraventricular tachycardia: Secondary | ICD-10-CM

## 2019-11-06 DIAGNOSIS — K2100 Gastro-esophageal reflux disease with esophagitis, without bleeding: Secondary | ICD-10-CM

## 2019-11-06 DIAGNOSIS — K852 Alcohol induced acute pancreatitis without necrosis or infection: Principal | ICD-10-CM

## 2019-11-06 DIAGNOSIS — D72825 Bandemia: Secondary | ICD-10-CM

## 2019-11-06 LAB — CBC WITH DIFFERENTIAL/PLATELET
Abs Immature Granulocytes: 0.05 10*3/uL (ref 0.00–0.07)
Basophils Absolute: 0.1 10*3/uL (ref 0.0–0.1)
Basophils Relative: 1 %
Eosinophils Absolute: 0.3 10*3/uL (ref 0.0–0.5)
Eosinophils Relative: 5 %
HCT: 43.8 % (ref 39.0–52.0)
Hemoglobin: 14.1 g/dL (ref 13.0–17.0)
Immature Granulocytes: 1 %
Lymphocytes Relative: 27 %
Lymphs Abs: 1.8 10*3/uL (ref 0.7–4.0)
MCH: 27.3 pg (ref 26.0–34.0)
MCHC: 32.2 g/dL (ref 30.0–36.0)
MCV: 84.7 fL (ref 80.0–100.0)
Monocytes Absolute: 0.6 10*3/uL (ref 0.1–1.0)
Monocytes Relative: 10 %
Neutro Abs: 3.9 10*3/uL (ref 1.7–7.7)
Neutrophils Relative %: 56 %
Platelets: 236 10*3/uL (ref 150–400)
RBC: 5.17 MIL/uL (ref 4.22–5.81)
RDW: 12.2 % (ref 11.5–15.5)
WBC: 6.8 10*3/uL (ref 4.0–10.5)
nRBC: 0 % (ref 0.0–0.2)

## 2019-11-06 LAB — LIPASE, BLOOD: Lipase: 22 U/L (ref 11–51)

## 2019-11-06 MED ORDER — OXYCODONE HCL 5 MG PO TABS: 5.0000 mg | ORAL_TABLET | Freq: Four times a day (QID) | ORAL | 0 refills | Status: AC | PRN

## 2019-11-06 MED ORDER — PANTOPRAZOLE SODIUM 40 MG PO TBEC
40.0000 mg | DELAYED_RELEASE_TABLET | Freq: Every day | ORAL | Status: DC
  Administered 2019-11-06: 40 mg via ORAL
  Filled 2019-11-06: qty 1

## 2019-11-06 NOTE — Discharge Instructions (Signed)
Acute Pancreatitis    Acute pancreatitis happens when the pancreas gets swollen. The pancreas is a large gland in the body that helps to control blood sugar. It also makes enzymes that help to digest food.  This condition can last a few days and cause serious problems. The lungs, heart, and kidneys may stop working.  What are the causes?  Causes include:  · Alcohol abuse.  · Drug abuse.  · Gallstones.  · A tumor in the pancreas.  Other causes include:  · Some medicines.  · Some chemicals.  · Diabetes.  · An infection.  · Damage caused by an accident.  · The poison (venom) from a scorpion bite.  · Belly (abdominal) surgery.  · The body's defense system (immune system) attacking the pancreas (autoimmune pancreatitis).  · Genes that are passed from parent to child (inherited).  In some cases, the cause is not known.  What are the signs or symptoms?  · Pain in the upper belly that may be felt in the back. The pain may be very bad.  · Swelling of the belly.  · Feeling sick to your stomach (nauseous) and throwing up (vomiting).  · Fever.  How is this treated?  You will likely have to stay in the hospital. Treatment may include:  · Pain medicine.  · Fluid through an IV tube.  · Placing a tube in the stomach to take out the stomach contents. This may help you stop throwing up.  · Not eating for 3-4 days.  · Antibiotic medicines, if you have an infection.  · Treating any other problems that may be the cause.  · Steroid medicines, if your problem is caused by your defense system attacking your body's own tissues.  · Surgery.  Follow these instructions at home:  Eating and drinking    · Follow instructions from your doctor about what to eat and drink.  · Eat foods that do not have a lot of fat in them.  · Eat small meals often. Do not eat big meals.  · Drink enough fluid to keep your pee (urine) pale yellow.  · Do not drink alcohol if it caused your condition.  Medicines  · Take over-the-counter and prescription medicines only  as told by your doctor.  · Ask your doctor if the medicine prescribed to you:  ? Requires you to avoid driving or using heavy machinery.  ? Can cause trouble pooping (constipation). You may need to take steps to prevent or treat trouble pooping:  § Take over-the-counter or prescription medicines.  § Eat foods that are high in fiber. These include beans, whole grains, and fresh fruits and vegetables.  § Limit foods that are high in fat and sugar. These include fried or sweet foods.  General instructions  · Do not use any products that contain nicotine or tobacco, such as cigarettes, e-cigarettes, and chewing tobacco. If you need help quitting, ask your doctor.  · Get plenty of rest.  · Check your blood sugar at home as told by your doctor.  · Keep all follow-up visits as told by your doctor. This is important.  Contact a doctor if:  · You do not get better as quickly as expected.  · You have new symptoms.  · Your symptoms get worse.  · You have pain or weakness that lasts a long time.  · You keep feeling sick to your stomach.  · You get better and then you have pain again.  ·   You have a fever.  Get help right away if:  · You cannot eat or keep fluids down.  · Your pain gets very bad.  · Your skin or the white part of your eyes turns yellow.  · You have sudden swelling in your belly.  · You throw up.  · You feel dizzy or you pass out (faint).  · Your blood sugar is high (over 300 mg/dL).  Summary  · Acute pancreatitis happens when the pancreas gets swollen.  · This condition is often caused by alcohol abuse, drug abuse, or gallstones.  · You will likely have to stay in the hospital for treatment.  This information is not intended to replace advice given to you by your health care provider. Make sure you discuss any questions you have with your health care provider.  Document Revised: 11/27/2017 Document Reviewed: 11/27/2017  Elsevier Patient Education © 2020 [employer]

## 2019-11-06 NOTE — Plan of Care (Signed)

## 2019-11-06 NOTE — Discharge Summary (Signed)
Physician Discharge Summary  Taylor Kidd TML:[id number] DOB: [birth year] DOA: 49/12/2019  PCP: Sharon Seller, NP  Admit date: 11/02/2019 Discharge date: 11/06/2019  Admitted From: Home Disposition: Home  Recommendations for Outpatient Follow-up:  1. Follow ups as below. 2. Please obtain CBC/BMP/Mag at follow up 3. Please follow up on the following pending results: None  Home Health: None Equipment/Devices: None  Discharge Condition: Stable CODE STATUS: Full code   Follow-up Information    Sharon Seller, NP. Schedule an appointment as soon as possible for a visit in 1 week(s).   Specialty: Geriatric Medicine Contact information: [address] [place] [address] [phone]               Hospital Course: 49 y.o.malewith history of chronic low back pain, GERD, and recent admission for idiopathic pancreatitis, intermittent alcohol binge, presenting with abdominal pain, and admitted with acute pancreatitis without pseudocyst or necrosis.  In the ED, slightly hypertensive. Normal CMP.  WBC 20.  Lipase 144.  CT abdomen and pelvis consistent with acute pancreatitis without necrosis or pseudocyst.  GI consulted.    Patient underwent RUQ ultrasound and MRCP.  The later confirmed acute pancreatitis without necrosis.  Eventually, pain improved.  Lipase normalized.  Tolerated soft diet, and discharged home.  Of note, patient admits to drinking up to 6 beers at times.  He was encouraged on moderation or cessation.  See individual problem list below for more hospital course.  Discharge Diagnoses:  Acute pancreatitis: Due to alcohol?  Admits to intermittent binging. -Encouraged alcohol cessation on moderation. -Oxycodone 5 mg as needed severe pain, #12  Leukocytosis: Likely demargination from the above.  Resolved.  SVT: On admission.  Lasted about 2 to 3 minutes and resolve with Valsalva maneuver per EDP.  No further events on telemetry during his  stay.  Hypokalemia: Resolved  Low testosterone: Continue testosterone supplement  Hyperlipidemia:  -Continue lovastatin  Chronic low back pain:  -Continue home tramadol   Body mass index is 31.25 kg/m.            Discharge Exam: Vitals:   11/05/19 2058 11/06/19 0500  BP: 127/83 (!) 133/98  Pulse: 69 70  Resp: 16 18  Temp: 98.6 F (37 C) 98.3 F (36.8 C)  SpO2: 98% 98%    GENERAL: No apparent distress.  Nontoxic. HEENT: MMM.  Vision and hearing grossly intact.  NECK: Supple.  No apparent JVD.  RESP:  No IWOB.  Fair aeration bilaterally. CVS:  RRR. Heart sounds normal.  ABD/GI/GU: Bowel sounds present. Soft.  Mild tenderness over RUQ and epigastric area MSK/EXT:  Moves extremities. No apparent deformity. No edema.  SKIN: no apparent skin lesion or wound NEURO: Awake, alert and oriented appropriately.  No apparent focal neuro deficit. PSYCH: Calm. Normal affect.  Discharge Instructions  Discharge Instructions    Call MD for:  persistant nausea and vomiting   Complete by: As directed    Call MD for:  severe uncontrolled pain   Complete by: As directed    Call MD for:  temperature >100.4   Complete by: As directed    Diet general   Complete by: As directed    Discharge instructions   Complete by: As directed    It has been a pleasure taking care of you!  You were hospitalized and treated for acute pancreatitis.  The cause is unclear but alcohol can increase your risk of recurrent pancreatitis.  We recommend avoiding alcohol.     Please  go to your hospital follow-up appointments or call to schedule as recommended.   Take care,   Increase activity slowly   Complete by: As directed      Allergies as of 11/06/2019   No Known Allergies     Medication List    TAKE these medications   acetaminophen 500 MG tablet Commonly known as: TYLENOL Take 500 mg by mouth as needed for mild pain.   atorvastatin 20 MG tablet Commonly known as: LIPITOR Take 1  tablet (20 mg total) by mouth daily.   cholecalciferol 25 MCG (1000 UNIT) tablet Commonly known as: VITAMIN D3 Take 1,000 Units by mouth 3 (three) times a week.   GOODY PM PO Take 1 Package by mouth at bedtime as needed (sleep).   oxyCODONE 5 MG immediate release tablet Commonly known as: Roxicodone Take 1 tablet (5 mg total) by mouth every 6 (six) hours as needed for up to 3 days for moderate pain or severe pain. Notes to patient: May cause constipation.   May make you dizzy or drowsy. Do not drive or do anything that could be dangerous until you know how this medicine affects you.    Testosterone 30 MG/ACT Soln Apply one pump under the arm once daily   traMADol 50 MG tablet Commonly known as: ULTRAM TAKE 1 TABLET (50 MG TOTAL) BY MOUTH 3 (THREE) TIMES DAILY AS NEEDED. What changed: reasons to take this   vitamin B-12 1000 MCG tablet Commonly known as: CYANOCOBALAMIN Take 1,000 mcg by mouth daily.       Consultations:  Gastroenterology  Procedures/Studies:   CT ABDOMEN PELVIS W CONTRAST  Result Date: 11/02/2019 CLINICAL DATA:  Supraumbilical abdominal pain for 2-3 days, suspected pancreatitis, history of pancreatitis EXAM: CT ABDOMEN AND PELVIS WITH CONTRAST TECHNIQUE: Multidetector CT imaging of the abdomen and pelvis was performed using the standard protocol following bolus administration of intravenous contrast. Sagittal and coronal MPR images reconstructed from axial data set. CONTRAST:  OMNIPAQUE IOHEXOL 300 MG/ML SOLN IV. No oral contrast. COMPARISON:  02/15/2019 FINDINGS: Lower chest: Minimal bibasilar atelectasis Hepatobiliary: Gallbladder and liver normal appearance Pancreas: Enlargement and surrounding edema at the pancreatic head/body consistent with acute pancreatitis. Less edema along pancreatic tail. No pancreatic mass, ductal dilatation, calcification or abnormal fluid collection. Spleen: Normal appearance Adrenals/Urinary Tract: No significant  abnormalities of the adrenal glands, kidneys, ureters, or bladder Stomach/Bowel: Normal appendix. Mild wall thickening of the descending duodenum with slight distension of the distal second and third portions likely related to adjacent pancreatitis. No evidence of obstruction. Stomach and bowel loops otherwise unremarkable. Vascular/Lymphatic: Aorta normal caliber. Vascular structures patent, including portal vein, SMV, and splenic vein. Few normal sized peripancreatic nodes. No abdominal or pelvic adenopathy. Reproductive: Unremarkable prostate gland and seminal vesicles Other: Umbilical hernia containing fat.  No free air or free fluid. Musculoskeletal: Degenerative disc disease changes L5-S1 with endplate spurring and bulging disc. IMPRESSION: Acute pancreatitis involving the pancreatic head/body with edema of adjacent duodenum. No acute complications of pancreatitis identified. Umbilical hernia containing fat. No other intra-abdominal or intrapelvic abnormalities. Electronically Signed   By: Ulyses Southward M.D.   On: 11/02/2019 12:47   MR 3D Recon At Scanner  Result Date: 11/04/2019 CLINICAL DATA:  Inpatient. Recurrent acute pancreatitis of uncertain etiology. Abdominal pain. EXAM: MRI ABDOMEN WITHOUT AND WITH CONTRAST (INCLUDING MRCP) TECHNIQUE: Multiplanar multisequence MR imaging of the abdomen was performed both before and after the administration of intravenous contrast. Heavily T2-weighted images of the biliary and pancreatic ducts  were obtained, and three-dimensional MRCP images were rendered by post processing. CONTRAST:  10mL GADAVIST GADOBUTROL 1 MMOL/ML IV SOLN COMPARISON:  11/02/2019 CT abdomen/pelvis and abdominal sonogram. FINDINGS: Lower chest: Mild dependent bibasilar atelectasis. Hepatobiliary: Normal liver size and configuration. No hepatic steatosis. No liver mass. Normal gallbladder with no cholelithiasis. No biliary ductal dilatation. Common bile duct diameter 2 mm. No choledocholithiasis.  No biliary masses, strictures or beading. Pancreas: There is thickening and mild parenchymal edema in the pancreatic head and neck with mild peripancreatic edema compatible with acute pancreatitis. Preserved pancreatic parenchymal enhancement. No discrete pancreatic mass or pancreatic duct dilation. No definite evidence of pancreas divisum. Spleen: Normal size. No mass. Adrenals/Urinary Tract: Normal adrenals. No hydronephrosis. Subcentimeter renal cortical cysts in the upper right and lower left kidneys. No suspicious renal masses. Stomach/Bowel: Normal non-distended stomach. Visualized small and large bowel is normal caliber, with no bowel wall thickening. Vascular/Lymphatic: Normal caliber abdominal aorta. Patent portal, splenic, hepatic and renal veins. Mildly enlarged 1.2 cm peripancreatic node near the porta hepatis (series 29/image 52). No additional pathologically enlarged abdominal lymph nodes. Other: No abdominal ascites or focal fluid collection. Musculoskeletal: No aggressive appearing focal osseous lesions. IMPRESSION: 1. Acute non-necrotizing pancreatitis involving the pancreatic head and neck. No discrete pancreatic mass or pancreatic duct dilation. 2. No cholelithiasis or choledocholithiasis. No biliary ductal dilatation. 3. Mildly enlarged peripancreatic node near the porta hepatis, nonspecific, probably reactive. Electronically Signed   By: Delbert Phenix M.D.   On: 11/04/2019 14:14   MR ABDOMEN MRCP W WO CONTAST  Result Date: 11/04/2019 CLINICAL DATA:  Inpatient. Recurrent acute pancreatitis of uncertain etiology. Abdominal pain. EXAM: MRI ABDOMEN WITHOUT AND WITH CONTRAST (INCLUDING MRCP) TECHNIQUE: Multiplanar multisequence MR imaging of the abdomen was performed both before and after the administration of intravenous contrast. Heavily T2-weighted images of the biliary and pancreatic ducts were obtained, and three-dimensional MRCP images were rendered by post processing. CONTRAST:  10mL  GADAVIST GADOBUTROL 1 MMOL/ML IV SOLN COMPARISON:  11/02/2019 CT abdomen/pelvis and abdominal sonogram. FINDINGS: Lower chest: Mild dependent bibasilar atelectasis. Hepatobiliary: Normal liver size and configuration. No hepatic steatosis. No liver mass. Normal gallbladder with no cholelithiasis. No biliary ductal dilatation. Common bile duct diameter 2 mm. No choledocholithiasis. No biliary masses, strictures or beading. Pancreas: There is thickening and mild parenchymal edema in the pancreatic head and neck with mild peripancreatic edema compatible with acute pancreatitis. Preserved pancreatic parenchymal enhancement. No discrete pancreatic mass or pancreatic duct dilation. No definite evidence of pancreas divisum. Spleen: Normal size. No mass. Adrenals/Urinary Tract: Normal adrenals. No hydronephrosis. Subcentimeter renal cortical cysts in the upper right and lower left kidneys. No suspicious renal masses. Stomach/Bowel: Normal non-distended stomach. Visualized small and large bowel is normal caliber, with no bowel wall thickening. Vascular/Lymphatic: Normal caliber abdominal aorta. Patent portal, splenic, hepatic and renal veins. Mildly enlarged 1.2 cm peripancreatic node near the porta hepatis (series 29/image 52). No additional pathologically enlarged abdominal lymph nodes. Other: No abdominal ascites or focal fluid collection. Musculoskeletal: No aggressive appearing focal osseous lesions. IMPRESSION: 1. Acute non-necrotizing pancreatitis involving the pancreatic head and neck. No discrete pancreatic mass or pancreatic duct dilation. 2. No cholelithiasis or choledocholithiasis. No biliary ductal dilatation. 3. Mildly enlarged peripancreatic node near the porta hepatis, nonspecific, probably reactive. Electronically Signed   By: Delbert Phenix M.D.   On: 11/04/2019 14:14   US Abdomen Limited RUQ  Result Date: 11/02/2019 CLINICAL DATA:  Abdominal pain with evidence of pancreatitis on recent CT EXAM: ULTRASOUND  ABDOMEN LIMITED  RIGHT UPPER QUADRANT COMPARISON:  CT abdomen and pelvis November 02, 2019 FINDINGS: Gallbladder: No gallstones or wall thickening visualized. There is no pericholecystic fluid. No sonographic Murphy sign noted by sonographer. Common bile duct: Diameter: 3 mm. No intrahepatic or extrahepatic biliary duct dilatation. Liver: No focal lesion identified. Within normal limits in parenchymal echogenicity. Portal vein is patent on color Doppler imaging with normal direction of blood flow towards the liver. Other: None. IMPRESSION: Study within normal limits. Electronically Signed   By: Bretta Bang III M.D.   On: 11/02/2019 16:19        The results of significant diagnostics from this hospitalization (including imaging, microbiology, ancillary and laboratory) are listed below for reference.     Microbiology: Recent Results (from the past 240 hour(s))  SARS Coronavirus 2 by RT PCR (hospital order, performed in [facility] hospital lab) Nasopharyngeal Nasopharyngeal Swab     Status: None   Collection Time: 11/02/19  1:49 PM   Specimen: Nasopharyngeal Swab  Result Value Ref Range Status   SARS Coronavirus 2 NEGATIVE NEGATIVE Final    Comment: (NOTE) SARS-CoV-2 target nucleic acids are NOT DETECTED.  The SARS-CoV-2 RNA is generally detectable in upper and lower respiratory specimens during the acute phase of infection. The lowest concentration of SARS-CoV-2 viral copies this assay can detect is 250 copies / mL. A negative result does not preclude SARS-CoV-2 infection and should not be used as the sole basis for treatment or other patient management decisions.  A negative result may occur with improper specimen collection / handling, submission of specimen other than nasopharyngeal swab, presence of viral mutation(s) within the areas targeted by this assay, and inadequate number of viral copies (<250 copies / mL). A negative result must be combined with clinical observations,  patient history, and epidemiological information.  Fact Sheet for Patients:   BoilerBrush.com.cy  Fact Sheet for Healthcare Providers: https://pope.com/  This test is not yet approved or  cleared by the [place] FDA and has been authorized for detection and/or diagnosis of SARS-CoV-2 by FDA under an Emergency Use Authorization (EUA).  This EUA will remain in effect (meaning this test can be used) for the duration of the COVID-19 declaration under Section 564(b)(1) of the Act, 21 U.S.C. section 360bbb-3(b)(1), unless the authorization is terminated or revoked sooner.  Performed at [facility], [address] [place], [place] [address]      Labs: BNP (last 3 results) No results for input(s): BNP in the last 8760 hours. Basic Metabolic Panel: Recent Labs  Lab 11/02/19 0920 11/03/19 0539 11/04/19 0421 11/05/19 0508  NA 136 134* 138 135  K 4.0 3.4* 3.6 3.5  CL 98 97* 99 97*  CO2 25 25 29 29   GLUCOSE 119* 100* 93 100*  BUN 15 10 9 12   CREATININE 0.73 0.61 0.70 0.93  CALCIUM 9.8 9.2 9.3 9.1   Liver Function Tests: Recent Labs  Lab 11/02/19 0920 11/03/19 0539 11/04/19 0421 11/05/19 0508  AST 21 16 15 17   ALT 27 19 19 22   ALKPHOS 91 73 80 71  BILITOT 1.3* 1.5* 0.8 0.5  PROT 8.1 6.8 6.7 6.5  ALBUMIN 4.2 3.6 3.4* 3.3*   Recent Labs  Lab 11/02/19 0920 11/05/19 0508  LIPASE 144* 22   No results for input(s): AMMONIA in the last 168 hours. CBC: Recent Labs  Lab 11/02/19 0920 11/03/19 0539 11/04/19 0421 11/05/19 0508 11/06/19 0528  WBC 19.9* 19.2* 13.4* 9.2 6.8  NEUTROABS  --   --  10.1* 6.1 3.9  HGB 16.6 14.3 14.2 14.3 14.1  HCT 49.1 42.9 44.1 44.8 43.8  MCV 82.0 82.8 84.2 84.5 84.7  PLT 248 208 208 217 236   Cardiac Enzymes: No results for input(s): CKTOTAL, CKMB, CKMBINDEX, TROPONINI in the last 168 hours. BNP: Invalid input(s): POCBNP CBG: No results for input(s): GLUCAP in  the last 168 hours. D-Dimer No results for input(s): DDIMER in the last 72 hours. Hgb A1c No results for input(s): HGBA1C in the last 72 hours. Lipid Profile No results for input(s): CHOL, HDL, LDLCALC, TRIG, CHOLHDL, LDLDIRECT in the last 72 hours. Thyroid function studies No results for input(s): TSH, T4TOTAL, T3FREE, THYROIDAB in the last 72 hours.  Invalid input(s): FREET3 Anemia work up No results for input(s): VITAMINB12, FOLATE, FERRITIN, TIBC, IRON, RETICCTPCT in the last 72 hours. Urinalysis    Component Value Date/Time   COLORURINE YELLOW 02/14/2019 2346   APPEARANCEUR CLEAR 02/14/2019 2346   LABSPEC 1.020 02/14/2019 2346   PHURINE 6.0 02/14/2019 2346   GLUCOSEU NEGATIVE 02/14/2019 2346   HGBUR SMALL (A) 02/14/2019 2346   BILIRUBINUR NEGATIVE 02/14/2019 2346   KETONESUR 5 (A) 02/14/2019 2346   PROTEINUR NEGATIVE 02/14/2019 2346   NITRITE NEGATIVE 02/14/2019 2346   LEUKOCYTESUR NEGATIVE 02/14/2019 2346   Sepsis Labs Invalid input(s): PROCALCITONIN,  WBC,  LACTICIDVEN   Time coordinating discharge: 35 minutes  SIGNED:  Almon Herculesaye T Christoffer Currier, MD  [facility] 11/06/2019, 3:57 PM  If 7PM-7AM, please contact night-coverage [email or web address]

## 2019-11-07 ENCOUNTER — Telehealth: Payer: Self-pay | Admitting: *Deleted

## 2019-11-07 NOTE — Telephone Encounter (Signed)
Transition Care Management Follow-Up Telephone Call   Date discharged and where: 11/06/2019 [place]  How have you been since you were released from the hospital? Stomach still Sore and hurts.   Any patient concerns? No  Items Reviewed:   Meds: Yes. Given Oxycodone #12 for pain.  Allergies: Yes. No new Allergies  Dietary Changes Reviewed:Yes  Functional Questionnaire:  Independent-I Dependent-D  ADLs:I   Dressing- I    Eating-I   Maintaining continence-I   Transferring-I   Transportation-I   Meal Prep-I   Managing Meds- I  Confirmed importance and Date/Time of follow-up visits scheduled:Yes. Appt 11/11/2019 with [name]   Confirmed with patient if condition worsens to call PCP or go to the Emergency Dept. Patient was given office number and encouraged to call back with questions or concerns: Yes

## 2019-11-11 ENCOUNTER — Other Ambulatory Visit: Payer: Self-pay

## 2019-11-11 ENCOUNTER — Other Ambulatory Visit: Payer: Self-pay | Admitting: Nurse Practitioner

## 2019-11-11 ENCOUNTER — Other Ambulatory Visit: Payer: Self-pay | Admitting: *Deleted

## 2019-11-11 ENCOUNTER — Ambulatory Visit (INDEPENDENT_AMBULATORY_CARE_PROVIDER_SITE_OTHER): Payer: BC Managed Care – PPO | Admitting: Nurse Practitioner

## 2019-11-11 ENCOUNTER — Encounter: Payer: Self-pay | Admitting: Nurse Practitioner

## 2019-11-11 VITALS — BP 124/82 | HR 81 | Temp 96.2°F | Ht 75.0 in | Wt 247.0 lb

## 2019-11-11 DIAGNOSIS — E876 Hypokalemia: Secondary | ICD-10-CM | POA: Diagnosis not present

## 2019-11-11 DIAGNOSIS — G894 Chronic pain syndrome: Secondary | ICD-10-CM | POA: Diagnosis not present

## 2019-11-11 DIAGNOSIS — E782 Mixed hyperlipidemia: Secondary | ICD-10-CM | POA: Diagnosis not present

## 2019-11-11 DIAGNOSIS — G8929 Other chronic pain: Secondary | ICD-10-CM

## 2019-11-11 DIAGNOSIS — E119 Type 2 diabetes mellitus without complications: Secondary | ICD-10-CM

## 2019-11-11 DIAGNOSIS — M546 Pain in thoracic spine: Secondary | ICD-10-CM

## 2019-11-11 DIAGNOSIS — E559 Vitamin D deficiency, unspecified: Secondary | ICD-10-CM

## 2019-11-11 DIAGNOSIS — K859 Acute pancreatitis without necrosis or infection, unspecified: Secondary | ICD-10-CM

## 2019-11-11 DIAGNOSIS — M5441 Lumbago with sciatica, right side: Secondary | ICD-10-CM

## 2019-11-11 NOTE — Telephone Encounter (Signed)
Please refer to phone note with Kennon Rounds. Thank you

## 2019-11-11 NOTE — Telephone Encounter (Signed)
Patient notified and agreed.  

## 2019-11-11 NOTE — Telephone Encounter (Signed)
I spoke with patient about his refill request and what Abbey Chatters NP had said about refill he said he only had 12 oxycodone and he took those on Wednesday, Thursday, Friday and started back taking his Tramadol on Saturday, Sunday, and today and he is going to be out as of today and he says it's going to hard for him to function without his tramadol and wants to know if you can fill prescription today for him     Please Advise

## 2019-11-11 NOTE — Progress Notes (Signed)
Careteam: Patient Care Team: Lauree Chandler, NP as PCP - General (Geriatric Medicine) Suella Broad, MD as Consulting Physician (Physical Medicine and Rehabilitation) Berle Mull, MD as Consulting Physician (Sports Medicine)  PLACE OF SERVICE:  [place] Directive information    No Known Allergies  Chief Complaint  Patient presents with   [facility] follow-up, patient was hospitalizied from 9/11-9/15/2021 for pancreatitis    Best Practice Recommendations    Discuss need for Hep C screeneing    Quality Metric Gaps    Discuss need for MALB   Immunizations    Refused Flu vaccine today and plans to get in the near future covid vaccine      HPI: Patient is a 49 y.o. male to follow up hospitalization from acute pancreatitis.  Feeling well except for some "butterflies in stomach" Reports slow abdominal pain and nausea prior to hospitalization and knew it was coming.  Woke up one morning and knew he had to go to the hospital. No fevers since he has left the hospital. Eating well.  No abdominal pain.   Was given oxycodone by hospital doctor. He reports he is out of tramadol but filled Rx for 3 day supply of oxycodone.   Review of Systems:  Review of Systems  Constitutional: Negative for chills, fever and weight loss.  HENT: Negative for tinnitus.   Respiratory: Negative for cough, sputum production and shortness of breath.   Cardiovascular: Negative for chest pain, palpitations and leg swelling.  Gastrointestinal: Positive for abdominal pain (improved). Negative for constipation, diarrhea and heartburn.  Genitourinary: Negative for dysuria, frequency and urgency.  Musculoskeletal: Negative for back pain, falls, joint pain and myalgias.  Skin: Negative.   Neurological: Negative for dizziness and headaches.  Psychiatric/Behavioral: Negative for depression and memory loss. The patient does not have insomnia.     Past Medical  History:  Diagnosis Date   Anxiety    Arthritis    Depression    Diabetes mellitus without complication (HCC)    GERD (gastroesophageal reflux disease)    Heartburn    Hematuria, unspecified    Lumbago    Other abnormal blood chemistry    Other malaise and fatigue    Palpitations    Unspecified vitamin D deficiency    Past Surgical History:  Procedure Laterality Date   SHOULDER ARTHROSCOPY WITH ROTATOR CUFF REPAIR AND SUBACROMIAL DECOMPRESSION Right 12/22/2016   Procedure: Right shoulder arthroscopy, subacromial decompression, mini open rotator cuff repair;  Surgeon: Susa Day, MD;  Location: [facility];  Service: Orthopedics;  Laterality: Right;  120 mins   Social History:   reports that he quit smoking about 2 years ago. His smoking use included cigarettes. He has a 15.00 pack-year smoking history. He has never used smokeless tobacco. He reports current alcohol use. He reports that he does not use drugs.  History reviewed. No pertinent family history.  Medications: Patient's Medications  New Prescriptions   No medications on file  Previous Medications   ACETAMINOPHEN (TYLENOL) 500 MG TABLET    Take 500 mg by mouth as needed for mild pain.    ATORVASTATIN (LIPITOR) 20 MG TABLET    Take 1 tablet (20 mg total) by mouth daily.   CHOLECALCIFEROL (VITAMIN D3) 25 MCG (1000 UNIT) TABLET    Take 1,000 Units by mouth 3 (three) times a week.   DIPHENHYDRAMINE-APAP, SLEEP, (GOODY PM PO)    Take 1 Package by mouth at bedtime as needed (  sleep).   TESTOSTERONE 30 MG/ACT SOLN    Apply one pump under the arm once daily   TRAMADOL (ULTRAM) 50 MG TABLET    TAKE 1 TABLET (50 MG TOTAL) BY MOUTH 3 (THREE) TIMES DAILY AS NEEDED.   VITAMIN B-12 (CYANOCOBALAMIN) 1000 MCG TABLET    Take 1,000 mcg by mouth daily.  Modified Medications   No medications on file  Discontinued Medications   No medications on file    Physical Exam:  Vitals:   11/11/19 0838  BP: 124/82  Pulse: 81    Temp: (!) 96.2 F (35.7 C)  TempSrc: Temporal  SpO2: 96%  Weight: 247 lb (112 kg)  Height: '6\' 3"'  (1.905 m)   Body mass index is 30.87 kg/m. Wt Readings from Last 3 Encounters:  11/11/19 247 lb (112 kg)  11/02/19 250 lb (113.4 kg)  02/26/19 250 lb 6.4 oz (113.6 kg)    Physical Exam Constitutional:      General: He is not in acute distress.    Appearance: He is well-developed. He is not diaphoretic.  HENT:     Head: Normocephalic and atraumatic.     Mouth/Throat:     Pharynx: No oropharyngeal exudate.  Eyes:     Conjunctiva/sclera: Conjunctivae normal.     Pupils: Pupils are equal, round, and reactive to light.  Cardiovascular:     Rate and Rhythm: Normal rate and regular rhythm.     Heart sounds: Normal heart sounds.  Pulmonary:     Effort: Pulmonary effort is normal.     Breath sounds: Normal breath sounds.  Abdominal:     General: Bowel sounds are normal. There is no distension.     Palpations: Abdomen is soft.     Tenderness: There is abdominal tenderness (slight to epigastric area).  Musculoskeletal:        General: No tenderness.     Cervical back: Normal range of motion and neck supple.  Skin:    General: Skin is warm and dry.  Neurological:     Mental Status: He is alert and oriented to person, place, and time.  Psychiatric:        Mood and Affect: Mood normal.     Labs reviewed: Basic Metabolic Panel: Recent Labs    11/03/19 0539 11/04/19 0421 11/05/19 0508  NA 134* 138 135  K 3.4* 3.6 3.5  CL 97* 99 97*  CO2 '25 29 29  ' GLUCOSE 100* 93 100*  BUN '10 9 12  ' CREATININE 0.61 0.70 0.93  CALCIUM 9.2 9.3 9.1   Liver Function Tests: Recent Labs    11/03/19 0539 11/04/19 0421 11/05/19 0508  AST '16 15 17  ' ALT '19 19 22  ' ALKPHOS 73 80 71  BILITOT 1.5* 0.8 0.5  PROT 6.8 6.7 6.5  ALBUMIN 3.6 3.4* 3.3*   Recent Labs    02/26/19 1420 11/02/19 0920 11/05/19 0508  LIPASE 23 144* 22   No results for input(s): AMMONIA in the last 8760  hours. CBC: Recent Labs    11/04/19 0421 11/05/19 0508 11/06/19 0528  WBC 13.4* 9.2 6.8  NEUTROABS 10.1* 6.1 3.9  HGB 14.2 14.3 14.1  HCT 44.1 44.8 43.8  MCV 84.2 84.5 84.7  PLT 208 217 236   Lipid Panel: Recent Labs    02/15/19 0127 02/15/19 0500 11/03/19 1254  CHOL  --  160  --   HDL  --  43  --   LDLCALC  --  104*  --   TRIG 84  67 113  CHOLHDL  --  3.7  --    TSH: No results for input(s): TSH in the last 8760 hours. A1C: Lab Results  Component Value Date   HGBA1C 5.3 10/18/2018     Assessment/Plan 1. Acute pancreatitis, unspecified complication status, unspecified pancreatitis type -improved at this time. Took oxycodone prescribed by hospital. Reports overall pain has improved but still feels some mild discomfort. Encouraged against any ETOH use at this time. Staying hydrated, advance diet as tolerates.  - CMP with eGFR([facility]); Future - CBC with Differential/Platelet; Future - Magnesium; Future  2. Chronic pain syndrome Due to chronic shoulder and low back pain, requesting refill of tramadol however not due at this time. He has been hospitalized from 9/11-9/15 and then was given oxycodone for 3 days.  3. Mixed hyperlipidemia -continues on lipitor 20 mg daily with dietary modifications. - Lipid Panel; Future - CMP with eGFR([facility]); Future  4. Hypokalemia -replaced during hospitalization, will follow up - CMP with eGFR([facility]); Future - Magnesium; Future  5. Diabetes mellitus without complication ([place]) -previously on metformin, will follow up labs - Hemoglobin A1c; Future - CBC with Differential/Platelet; Future  6. Vitamin D deficiency -continues on supplement.  - Vitamin D, 25-hydroxy; Future  Next appt: 11/11/2019 for lab and 4 month for routine follow up.  Carlos American. [place], [facility] [phone]

## 2019-11-11 NOTE — Patient Instructions (Signed)
Follow up fasting lab work later this week or early next week  Follow up 4 months for routine follow up

## 2019-11-11 NOTE — Telephone Encounter (Signed)
Patient wants to know why his Rx was refused Please Advise.

## 2019-11-11 NOTE — Telephone Encounter (Signed)
Patient notified and agreed. Will wait till 11/15/2019.

## 2019-11-11 NOTE — Telephone Encounter (Signed)
Patient requested refill Epic LR: 10/11/2019 Contract on [employer] Rx and sent to [place] for approval.

## 2019-11-11 NOTE — Telephone Encounter (Signed)
Please let Taylor Kidd know that we will not refill until 9/24  He was given oxycodone by the hospital which he filled- this is a minimal of a 3 day supply, plus he was in the hospital for 4 days.

## 2019-11-11 NOTE — Telephone Encounter (Signed)
He should have not been taking oxycodone with tramadol. We can not fill this any sooner.

## 2019-11-11 NOTE — Telephone Encounter (Signed)
It states the reason for refusal is too early.  I called [facility] and spoke with Reggy Eye and she stated last refill for Tramadol was 10/11/2019.  Please Advise.

## 2019-11-11 NOTE — Telephone Encounter (Signed)
Per Jessica---Patient is not due for the Tramadol until 9/24 due to getting pain medication in the hospital.

## 2019-11-15 ENCOUNTER — Other Ambulatory Visit: Payer: Self-pay | Admitting: Nurse Practitioner

## 2019-11-15 DIAGNOSIS — G8929 Other chronic pain: Secondary | ICD-10-CM

## 2019-11-15 DIAGNOSIS — M546 Pain in thoracic spine: Secondary | ICD-10-CM

## 2019-11-15 DIAGNOSIS — G894 Chronic pain syndrome: Secondary | ICD-10-CM

## 2019-11-15 MED ORDER — TRAMADOL HCL 50 MG PO TABS
50.0000 mg | ORAL_TABLET | Freq: Three times a day (TID) | ORAL | 0 refills | Status: DC | PRN
Start: 2019-11-15 — End: 2019-12-13

## 2019-11-15 NOTE — Telephone Encounter (Signed)
Patient called and requested refill Pended Rx and sent to [facility] for approval.

## 2019-11-15 NOTE — Addendum Note (Signed)
Addended by: Nelda Severe A on: 11/15/2019 08:12 AM   Modules accepted: Orders

## 2019-11-15 NOTE — Telephone Encounter (Signed)
Patient is calling requesting refill.  Patient stated that he is out of his medication.

## 2019-11-15 NOTE — Telephone Encounter (Signed)
#  90 TID PRN given 10/11/19.  Controlled substance contract completed 1/21  Last OV 11/11/19 - requesting refill at that time, but was told it was too early due to hospitalization and given 3 days of oxycodone.

## 2019-11-18 ENCOUNTER — Other Ambulatory Visit: Payer: BC Managed Care – PPO

## 2019-11-22 ENCOUNTER — Other Ambulatory Visit: Payer: BC Managed Care – PPO

## 2019-11-29 ENCOUNTER — Other Ambulatory Visit: Payer: BC Managed Care – PPO

## 2019-11-29 ENCOUNTER — Ambulatory Visit: Payer: Self-pay

## 2019-12-13 ENCOUNTER — Other Ambulatory Visit: Payer: Self-pay | Admitting: Nurse Practitioner

## 2019-12-13 ENCOUNTER — Telehealth: Payer: Self-pay

## 2019-12-13 DIAGNOSIS — M546 Pain in thoracic spine: Secondary | ICD-10-CM

## 2019-12-13 DIAGNOSIS — M5441 Lumbago with sciatica, right side: Secondary | ICD-10-CM

## 2019-12-13 DIAGNOSIS — G8929 Other chronic pain: Secondary | ICD-10-CM

## 2019-12-13 DIAGNOSIS — R7989 Other specified abnormal findings of blood chemistry: Secondary | ICD-10-CM

## 2019-12-13 DIAGNOSIS — G894 Chronic pain syndrome: Secondary | ICD-10-CM

## 2019-12-13 MED ORDER — TESTOSTERONE 30 MG/ACT TD SOLN: TRANSDERMAL | 0 refills | Status: DC

## 2019-12-13 NOTE — Telephone Encounter (Signed)
Patient is requesting refill on "Tramadol". Patient last refill was 924/2021 with 90 tablets to be take 3 times daily as needed. Patient has no refills. Last office visit was 11/11/2019. Medication pend and sent to Richarda Blade, NP due to Sharon Seller, NP being out of office. Please Advise.

## 2019-12-13 NOTE — Telephone Encounter (Signed)
Patient called to see if anyone had received his voicemail request for a refill on medication I told him yes it had been placed

## 2019-12-13 NOTE — Telephone Encounter (Signed)
Patient requested refill  Epic LR: 11/13/19 Contract on file Pended Rx and sent to [employer] for approval [name] out of office)

## 2020-01-09 ENCOUNTER — Other Ambulatory Visit: Payer: Self-pay

## 2020-01-09 DIAGNOSIS — M546 Pain in thoracic spine: Secondary | ICD-10-CM

## 2020-01-09 DIAGNOSIS — R7989 Other specified abnormal findings of blood chemistry: Secondary | ICD-10-CM

## 2020-01-09 DIAGNOSIS — G8929 Other chronic pain: Secondary | ICD-10-CM

## 2020-01-09 DIAGNOSIS — G894 Chronic pain syndrome: Secondary | ICD-10-CM

## 2020-01-09 NOTE — Telephone Encounter (Signed)
Patient is requesting refill on medications "Tramadol 50mg " last refill 12/13/2019 and "Testosterone 30mg " last refill 12/13/2019. Non Opioid agreement signed on 03/05/2019. Medications pend and sent to PCP 12/15/2019, NP . Please Advise.

## 2020-01-10 ENCOUNTER — Other Ambulatory Visit: Payer: Self-pay | Admitting: Family

## 2020-01-10 DIAGNOSIS — M546 Pain in thoracic spine: Secondary | ICD-10-CM

## 2020-01-10 DIAGNOSIS — G894 Chronic pain syndrome: Secondary | ICD-10-CM

## 2020-01-10 DIAGNOSIS — G8929 Other chronic pain: Secondary | ICD-10-CM

## 2020-01-10 MED ORDER — TESTOSTERONE 30 MG/ACT TD SOLN: TRANSDERMAL | 0 refills | Status: DC

## 2020-01-10 MED ORDER — TRAMADOL HCL 50 MG PO TABS: 50.0000 mg | ORAL_TABLET | Freq: Three times a day (TID) | ORAL | 0 refills | Status: DC | PRN

## 2020-01-10 NOTE — Telephone Encounter (Signed)
RX last filled 12/13/2019  Treatment agreement on file from 02/26/2019

## 2020-02-06 ENCOUNTER — Other Ambulatory Visit: Payer: Self-pay | Admitting: Nurse Practitioner

## 2020-02-06 DIAGNOSIS — M546 Pain in thoracic spine: Secondary | ICD-10-CM

## 2020-02-06 DIAGNOSIS — M5441 Lumbago with sciatica, right side: Secondary | ICD-10-CM

## 2020-02-06 DIAGNOSIS — G894 Chronic pain syndrome: Secondary | ICD-10-CM

## 2020-02-06 NOTE — Telephone Encounter (Signed)
Patient is requesting a refill on medication 'Tramadol 50mg ". Patient last refill was 01/10/2020 with 90 tablets to be taken 3 times daily. Patient is due for refill. Medication pend and sent to PCP 01/12/2020, NP . Please Advise.

## 2020-02-07 ENCOUNTER — Other Ambulatory Visit: Payer: Self-pay | Admitting: *Deleted

## 2020-02-07 DIAGNOSIS — G894 Chronic pain syndrome: Secondary | ICD-10-CM

## 2020-02-07 DIAGNOSIS — M546 Pain in thoracic spine: Secondary | ICD-10-CM

## 2020-02-07 DIAGNOSIS — M5441 Lumbago with sciatica, right side: Secondary | ICD-10-CM

## 2020-02-07 MED ORDER — TRAMADOL HCL 50 MG PO TABS: 50.0000 mg | ORAL_TABLET | Freq: Three times a day (TID) | ORAL | 0 refills | Status: DC | PRN

## 2020-02-07 NOTE — Telephone Encounter (Signed)
Patient requested refill Epic LR: 01/10/2020 Contract on [employer] Rx and sent to [place] for approval.

## 2020-03-06 ENCOUNTER — Other Ambulatory Visit: Payer: Self-pay | Admitting: Nurse Practitioner

## 2020-03-06 DIAGNOSIS — M546 Pain in thoracic spine: Secondary | ICD-10-CM

## 2020-03-06 DIAGNOSIS — G894 Chronic pain syndrome: Secondary | ICD-10-CM

## 2020-03-06 DIAGNOSIS — G8929 Other chronic pain: Secondary | ICD-10-CM

## 2020-03-06 NOTE — Telephone Encounter (Signed)
I called and informed patient of what [name] had said and he was not happy but said he would wait for her to send refill on Monday

## 2020-03-06 NOTE — Telephone Encounter (Signed)
Please let pt know we will refill on Monday. It looks like he has refilled early the past 2 months.

## 2020-03-06 NOTE — Telephone Encounter (Signed)
Patient says he usually get his medication on the fourth Friday and that's today and to remind you that on Monday when its due because of the bad wether he will not be able to get it

## 2020-03-06 NOTE — Telephone Encounter (Signed)
He has consistently been getting this medication 2-3 days early over the last 3 months (or more). The 4th Friday is only 28 days and he is getting a 30 day supply. He should have enough to get him into next week and I will send over the refill on Monday. Typically can not refill narcotics early but he has gotten it early several times due to it being on the weekend.

## 2020-03-06 NOTE — Telephone Encounter (Signed)
Patient just called back a second time wanting to know if I had heard back from Abbey Chatters NP in regards to his request for medication refill I told him I haven't heard back as of yet but as soon as I do I will call him with her reply

## 2020-03-09 MED ORDER — TRAMADOL HCL 50 MG PO TABS: 50.0000 mg | ORAL_TABLET | Freq: Three times a day (TID) | ORAL | 0 refills | Status: DC | PRN

## 2020-03-09 NOTE — Addendum Note (Signed)
Addended by: Maurice Small on: 03/09/2020 08:28 AM   Modules accepted: Orders

## 2020-03-11 ENCOUNTER — Ambulatory Visit: Payer: BC Managed Care – PPO | Admitting: Nurse Practitioner

## 2020-04-01 ENCOUNTER — Other Ambulatory Visit: Payer: Self-pay

## 2020-04-01 ENCOUNTER — Encounter: Payer: Self-pay | Admitting: Nurse Practitioner

## 2020-04-01 ENCOUNTER — Ambulatory Visit (INDEPENDENT_AMBULATORY_CARE_PROVIDER_SITE_OTHER): Payer: BC Managed Care – PPO | Admitting: Nurse Practitioner

## 2020-04-01 VITALS — BP 138/88 | HR 78 | Temp 96.9°F | Ht 75.0 in | Wt 246.2 lb

## 2020-04-01 DIAGNOSIS — E782 Mixed hyperlipidemia: Secondary | ICD-10-CM | POA: Diagnosis not present

## 2020-04-01 DIAGNOSIS — K2100 Gastro-esophageal reflux disease with esophagitis, without bleeding: Secondary | ICD-10-CM

## 2020-04-01 DIAGNOSIS — M546 Pain in thoracic spine: Secondary | ICD-10-CM

## 2020-04-01 DIAGNOSIS — G894 Chronic pain syndrome: Secondary | ICD-10-CM | POA: Diagnosis not present

## 2020-04-01 DIAGNOSIS — G8929 Other chronic pain: Secondary | ICD-10-CM

## 2020-04-01 DIAGNOSIS — E559 Vitamin D deficiency, unspecified: Secondary | ICD-10-CM | POA: Diagnosis not present

## 2020-04-01 DIAGNOSIS — R739 Hyperglycemia, unspecified: Secondary | ICD-10-CM | POA: Diagnosis not present

## 2020-04-01 DIAGNOSIS — L853 Xerosis cutis: Secondary | ICD-10-CM

## 2020-04-01 DIAGNOSIS — Z1159 Encounter for screening for other viral diseases: Secondary | ICD-10-CM

## 2020-04-01 DIAGNOSIS — E669 Obesity, unspecified: Secondary | ICD-10-CM

## 2020-04-01 NOTE — Progress Notes (Signed)
Careteam: Patient Care Team: Lauree Chandler, NP as PCP - General (Geriatric Medicine) Suella Broad, MD as Consulting Physician (Physical Medicine and Rehabilitation) Berle Mull, MD as Consulting Physician (Sports Medicine)  PLACE OF SERVICE:  [place] Directive information Does Patient Have a Medical Advance Directive?: Yes, Type of Advance Directive: [place];Living will  No Known Allergies  Chief Complaint  Patient presents with  . Medical Management of Chronic Issues    4 month follow-up and discuss need for Hep C screen, ,colonoscopy, eye exam, Malb, A1c, PNA vaccine. Foot exam today. Patient c/o back pain and bilateral foot itching.     HPI: Patient is a 50 y.o. male for routine follow up.  Did not get labs after last visit due to having COVID and then forgot to reschedule. Has been doing well.   Back pain- currently on tramadol PRN pain also uses tylenol and advil.  Reports increase pain in right shoulder blade and right side lower back. No recent therapy or exercises in that area.   Tops of feet, arms and legs are itching. Worse after shower. Scratching until he bleeds. No rash or sores noted.   Hyperlipidemia- heart healthy diet  Obesity- eating better, no exercise but very active on the job.   GERD- uses tum PRN. Occasionally will have symptoms.  Review of Systems:  Review of Systems  Constitutional: Negative for chills, fever and weight loss.  HENT: Negative for tinnitus.   Respiratory: Negative for cough, sputum production and shortness of breath.   Cardiovascular: Negative for chest pain, palpitations and leg swelling.  Gastrointestinal: Negative for abdominal pain, constipation, diarrhea and heartburn.  Genitourinary: Negative for dysuria, frequency and urgency.  Musculoskeletal: Positive for back pain, joint pain and myalgias. Negative for falls.  Skin: Negative.   Neurological: Negative for dizziness and  headaches.  Psychiatric/Behavioral: Negative for depression and memory loss. The patient does not have insomnia.     Past Medical History:  Diagnosis Date  . Anxiety   . Arthritis   . Depression   . Diabetes mellitus without complication ([place])   . GERD (gastroesophageal reflux disease)   . Heartburn   . Hematuria, unspecified   . Lumbago   . Other abnormal blood chemistry   . Other malaise and fatigue   . Palpitations   . Unspecified vitamin D deficiency    Past Surgical History:  Procedure Laterality Date  . SHOULDER ARTHROSCOPY WITH ROTATOR CUFF REPAIR AND SUBACROMIAL DECOMPRESSION Right 12/22/2016   Procedure: Right shoulder arthroscopy, subacromial decompression, mini open rotator cuff repair;  Surgeon: Susa Day, MD;  Location: [facility];  Service: Orthopedics;  Laterality: Right;  120 mins   Social History:   reports that he quit smoking about 3 years ago. His smoking use included cigarettes. He has a 15.00 pack-year smoking history. He has never used smokeless tobacco. He reports current alcohol use. He reports that he does not use drugs.  History reviewed. No pertinent family history.  Medications: Patient's Medications  New Prescriptions   No medications on file  Previous Medications   ACETAMINOPHEN (TYLENOL) 500 MG TABLET    Take 500 mg by mouth as needed for mild pain.    ATORVASTATIN (LIPITOR) 20 MG TABLET    Take 1 tablet (20 mg total) by mouth daily.   CHOLECALCIFEROL (VITAMIN D3) 25 MCG (1000 UNIT) TABLET    Take 1,000 Units by mouth 3 (three) times a week.   DIPHENHYDRAMINE-APAP, SLEEP, (GOODY PM  PO)    Take 1 Package by mouth at bedtime as needed (sleep).   TESTOSTERONE 30 MG/ACT SOLN    Apply one pump under the arm once daily   TRAMADOL (ULTRAM) 50 MG TABLET    Take 1 tablet (50 mg total) by mouth 3 (three) times daily as needed. DX M54.41 , G89.29   VITAMIN B-12 (CYANOCOBALAMIN) 1000 MCG TABLET    Take 1,000 mcg by mouth daily.  Modified Medications   No  medications on file  Discontinued Medications   No medications on file    Physical Exam:  Vitals:   04/01/20 0939  BP: 138/88  Pulse: 78  Temp: (!) 96.9 F (36.1 C)  SpO2: 95%  Weight: 246 lb 3.2 oz (111.7 kg)  Height: '6\' 3"'  (1.905 m)   Body mass index is 30.77 kg/m. Wt Readings from Last 3 Encounters:  04/01/20 246 lb 3.2 oz (111.7 kg)  11/11/19 247 lb (112 kg)  11/02/19 250 lb (113.4 kg)    Physical Exam Constitutional:      General: He is not in acute distress.    Appearance: He is well-developed and well-nourished. He is not diaphoretic.  HENT:     Head: Normocephalic and atraumatic.     Mouth/Throat:     Mouth: Oropharynx is clear and moist.     Pharynx: No oropharyngeal exudate.  Eyes:     Extraocular Movements: EOM normal.     Conjunctiva/sclera: Conjunctivae normal.     Pupils: Pupils are equal, round, and reactive to light.  Cardiovascular:     Rate and Rhythm: Normal rate and regular rhythm.     Heart sounds: Normal heart sounds.  Pulmonary:     Effort: Pulmonary effort is normal.     Breath sounds: Normal breath sounds.  Abdominal:     General: Bowel sounds are normal.     Palpations: Abdomen is soft.  Musculoskeletal:        General: No tenderness or edema.     Cervical back: Normal range of motion and neck supple.  Skin:    General: Skin is warm and dry.  Neurological:     Mental Status: He is alert and oriented to person, place, and time.  Psychiatric:        Mood and Affect: Mood and affect normal.    Labs reviewed: Basic Metabolic Panel: Recent Labs    11/03/19 0539 11/04/19 0421 11/05/19 0508  NA 134* 138 135  K 3.4* 3.6 3.5  CL 97* 99 97*  CO2 '25 29 29  ' GLUCOSE 100* 93 100*  BUN '10 9 12  ' CREATININE 0.61 0.70 0.93  CALCIUM 9.2 9.3 9.1   Liver Function Tests: Recent Labs    11/03/19 0539 11/04/19 0421 11/05/19 0508  AST '16 15 17  ' ALT '19 19 22  ' ALKPHOS 73 80 71  BILITOT 1.5* 0.8 0.5  PROT 6.8 6.7 6.5  ALBUMIN 3.6 3.4*  3.3*   Recent Labs    11/02/19 0920 11/05/19 0508  LIPASE 144* 22   No results for input(s): AMMONIA in the last 8760 hours. CBC: Recent Labs    11/04/19 0421 11/05/19 0508 11/06/19 0528  WBC 13.4* 9.2 6.8  NEUTROABS 10.1* 6.1 3.9  HGB 14.2 14.3 14.1  HCT 44.1 44.8 43.8  MCV 84.2 84.5 84.7  PLT 208 217 236   Lipid Panel: Recent Labs    11/03/19 1254  TRIG 113   TSH: No results for input(s): TSH in the last 8760 hours.  A1C: Lab Results  Component Value Date   HGBA1C 5.3 10/18/2018     Assessment/Plan 1. Chronic pain syndrome -continues to use tramadol PRN, encourage use of tylenol first. Continue exercises to help with pain reduction.  - Urine Drug Screen w/Alc, no confirm([facility]) - Ambulatory referral to Physical Therapy  2. Mixed hyperlipidemia -continue on lipitor with dietary modifications.  - CMP with eGFR([facility]) - Lipid panel  3. Hyperglycemia -diet controlled.  - Hemoglobin A1c  4. Vitamin D deficiency -continue on vit D supplement. - Vitamin D, 25-hydroxy  5. Gastroesophageal reflux disease with esophagitis without hemorrhage Stable, uses PRN tums if needed  6. Need for hepatitis C screening test - Hepatitis C antibody  7. Chronic right-sided thoracic back pain -chronic pain, uses tramadol for this. Will have PT evaluate and treat as well.  - Ambulatory referral to Physical Therapy  8. Dry skin Causing itching to skin. Encouraged to use cream after shower, to avoid very hot showers, avoid fragrances in body wash and lotions.   9. Obesity (BMI 30-39.9) Dietary modifications and increase in physical activity encouraged- he has a very active job.   Next appt: 6 months.  Carlos American. [place], [facility] [phone]

## 2020-04-04 LAB — DRUG MONITOR, PANEL 1, W/CONF, URINE
Amphetamines: NEGATIVE ng/mL (ref <500)
Barbiturates: NEGATIVE ng/mL (ref <300)
Benzodiazepines: NEGATIVE ng/mL (ref <100)
Cocaine Metabolite: NEGATIVE ng/mL (ref <150)
Codeine: NEGATIVE ng/mL (ref <50)
Creatinine: 63.2 mg/dL
Hydrocodone: NEGATIVE ng/mL (ref <50)
Hydromorphone: NEGATIVE ng/mL (ref <50)
Marijuana Metabolite: NEGATIVE ng/mL (ref <20)
Methadone Metabolite: NEGATIVE ng/mL (ref <100)
Morphine: NEGATIVE ng/mL (ref <50)
Norhydrocodone: NEGATIVE ng/mL (ref <50)
Noroxycodone: 891 ng/mL — ABNORMAL HIGH (ref <50)
Opiates: NEGATIVE ng/mL (ref <100)
Oxidant: NEGATIVE ug/mL
Oxycodone: 329 ng/mL — ABNORMAL HIGH (ref <50)
Oxycodone: POSITIVE ng/mL — AB (ref <100)
Oxymorphone: 668 ng/mL — ABNORMAL HIGH (ref <50)
Phencyclidine: NEGATIVE ng/mL (ref <25)
pH: 7.7 (ref 4.5–9.0)

## 2020-04-04 LAB — COMPLETE METABOLIC PANEL WITH GFR
AG Ratio: 1.6 (calc) (ref 1.0–2.5)
ALT: 25 U/L (ref 9–46)
AST: 24 U/L (ref 10–40)
Albumin: 4.5 g/dL (ref 3.6–5.1)
Alkaline phosphatase (APISO): 102 U/L (ref 36–130)
BUN: 18 mg/dL (ref 7–25)
CO2: 31 mmol/L (ref 20–32)
Calcium: 10 mg/dL (ref 8.6–10.3)
Chloride: 100 mmol/L (ref 98–110)
Creat: 0.81 mg/dL (ref 0.60–1.35)
GFR, Est African American: 121 mL/min/{1.73_m2} (ref >=60)
GFR, Est Non African American: 104 mL/min/{1.73_m2} (ref >=60)
Globulin: 2.8 g/dL (calc) (ref 1.9–3.7)
Glucose, Bld: 71 mg/dL (ref 65–99)
Potassium: 4.5 mmol/L (ref 3.5–5.3)
Sodium: 138 mmol/L (ref 135–146)
Total Bilirubin: 0.5 mg/dL (ref 0.2–1.2)
Total Protein: 7.3 g/dL (ref 6.1–8.1)

## 2020-04-04 LAB — LIPID PANEL
Cholesterol: 232 mg/dL — ABNORMAL HIGH (ref <200)
HDL: 44 mg/dL (ref >=40)
LDL Cholesterol (Calc): 149 mg/dL (calc) — ABNORMAL HIGH
Non-HDL Cholesterol (Calc): 188 mg/dL (calc) — ABNORMAL HIGH (ref <130)
Total CHOL/HDL Ratio: 5.3 (calc) — ABNORMAL HIGH (ref <5.0)
Triglycerides: 244 mg/dL — ABNORMAL HIGH (ref <150)

## 2020-04-04 LAB — VITAMIN D 25 HYDROXY (VIT D DEFICIENCY, FRACTURES): Vit D, 25-Hydroxy: 26 ng/mL — ABNORMAL LOW (ref 30–100)

## 2020-04-04 LAB — HEMOGLOBIN A1C
Hgb A1c MFr Bld: 5.6 % of total Hgb (ref <5.7)
Mean Plasma Glucose: 114 mg/dL
eAG (mmol/L): 6.3 mmol/L

## 2020-04-04 LAB — DM TEMPLATE

## 2020-04-04 LAB — HEPATITIS C ANTIBODY
Hepatitis C Ab: NONREACTIVE
SIGNAL TO CUT-OFF: 0.01 (ref <1.00)

## 2020-04-06 ENCOUNTER — Other Ambulatory Visit: Payer: Self-pay | Admitting: Nurse Practitioner

## 2020-04-07 ENCOUNTER — Other Ambulatory Visit: Payer: Self-pay

## 2020-04-07 ENCOUNTER — Telehealth: Payer: Self-pay

## 2020-04-07 ENCOUNTER — Telehealth (INDEPENDENT_AMBULATORY_CARE_PROVIDER_SITE_OTHER): Payer: BC Managed Care – PPO | Admitting: Nurse Practitioner

## 2020-04-07 DIAGNOSIS — G894 Chronic pain syndrome: Secondary | ICD-10-CM

## 2020-04-07 NOTE — Progress Notes (Signed)
This service is provided via telemedicine  No vital signs collected/recorded due to the encounter was a telemedicine visit.   Location of patient (ex: home, work): Home  Patient consents to a telephone visit:  Yes, see encounter dated 04/07/2020  Location of the provider (ex: office, home):  [facility]  Name of any referring provider: N/A  Names of all persons participating in the telemedicine service and their role in the encounter:Jessica Eubanks, Nurse Practitioner, Elveria Royals, CMA, and patient.   Time spent on call:  8 minutes with medical assistant

## 2020-04-07 NOTE — Progress Notes (Signed)
Careteam: Patient Care Team: Sharon Seller, NP as PCP - General (Geriatric Medicine) Sheran Luz, MD as Consulting Physician (Physical Medicine and Rehabilitation) Pati Gallo, MD as Consulting Physician (Sports Medicine)  Advanced Directive information    No Known Allergies  Chief Complaint  Patient presents with  . Acute Visit    Patient would like to discuss lab results.     HPI: Patient is a 50 y.o. male to review urine drug screen.  UDS positive for oxycodone yet no tramadol. Taylor Kidd reports he took 2 oxycodone daily for 3 days. Had 6 left from prior hospitalization in December 2020. He states he was also was using tramadol with the oxycodone.  He reports he never got the prescription filled from [facility] 2021 when he was hospital (oxy was also prescribed at that time) however PDMP reviewed and date filled was 11/06/19.  Back in January 2021 he reported to NP that he did not get oxycodone filled from December hospitalization based on notes in epic. At that time drug screen was obtained and negative for any pain medication.  He was been getting tramadol routinely every 30 days.   Discussed results and that this is a violation of his pain contract. He states he is willing to wean off medication at this time.     Review of Systems:  Review of Systems  Constitutional: Negative.   Musculoskeletal: Positive for back pain, joint pain and myalgias.  Psychiatric/Behavioral: Negative.    Past Medical History:  Diagnosis Date  . Anxiety   . Arthritis   . Depression   . Diabetes mellitus without complication (HCC)   . GERD (gastroesophageal reflux disease)   . Heartburn   . Hematuria, unspecified   . Lumbago   . Other abnormal blood chemistry   . Other malaise and fatigue   . Palpitations   . Unspecified vitamin D deficiency    Past Surgical History:  Procedure Laterality Date  . SHOULDER ARTHROSCOPY WITH ROTATOR CUFF REPAIR AND SUBACROMIAL  DECOMPRESSION Right 12/22/2016   Procedure: Right shoulder arthroscopy, subacromial decompression, mini open rotator cuff repair;  Surgeon: Jene Every, MD;  Location: [facility];  Service: Orthopedics;  Laterality: Right;  120 mins   Social History:   reports that he quit smoking about 3 years ago. His smoking use included cigarettes. He has a 15.00 pack-year smoking history. He has never used smokeless tobacco. He reports current alcohol use. He reports that he does not use drugs.  No family history on file.  Medications: Patient's Medications  New Prescriptions   No medications on file  Previous Medications   ACETAMINOPHEN (TYLENOL) 500 MG TABLET    Take 500 mg by mouth as needed for mild pain.    ATORVASTATIN (LIPITOR) 20 MG TABLET    Take 1 tablet (20 mg total) by mouth daily.   CHOLECALCIFEROL (VITAMIN D3) 25 MCG (1000 UNIT) TABLET    Take 1,000 Units by mouth 3 (three) times a week.   DIPHENHYDRAMINE-APAP, SLEEP, (GOODY PM PO)    Take 1 Package by mouth at bedtime as needed (sleep).   TESTOSTERONE 30 MG/ACT SOLN    Apply one pump under the arm once daily   TRAMADOL (ULTRAM) 50 MG TABLET    Take 1 tablet (50 mg total) by mouth 3 (three) times daily as needed. DX M54.41 , G89.29   VITAMIN B-12 (CYANOCOBALAMIN) 1000 MCG TABLET    Take 1,000 mcg by mouth daily.  Modified Medications   No medications  on file  Discontinued Medications   No medications on file    Physical Exam:  There were no vitals filed for this visit. There is no height or weight on file to calculate BMI. Wt Readings from Last 3 Encounters:  04/01/20 246 lb 3.2 oz (111.7 kg)  11/11/19 247 lb (112 kg)  11/02/19 250 lb (113.4 kg)      Labs reviewed: Basic Metabolic Panel: Recent Labs    11/04/19 0421 11/05/19 0508 04/01/20 1011  NA 138 135 138  K 3.6 3.5 4.5  CL 99 97* 100  CO2 29 29 31   GLUCOSE 93 100* 71  BUN 9 12 18   CREATININE 0.70 0.93 0.81  CALCIUM 9.3 9.1 10.0   Liver Function Tests: Recent  Labs    11/03/19 0539 11/04/19 0421 11/05/19 0508 04/01/20 1011  AST 16 15 17 24   ALT 19 19 22 25   ALKPHOS 73 80 71  --   BILITOT 1.5* 0.8 0.5 0.5  PROT 6.8 6.7 6.5 7.3  ALBUMIN 3.6 3.4* 3.3*  --    Recent Labs    11/02/19 0920 11/05/19 0508  LIPASE 144* 22   No results for input(s): AMMONIA in the last 8760 hours. CBC: Recent Labs    11/04/19 0421 11/05/19 0508 11/06/19 0528  WBC 13.4* 9.2 6.8  NEUTROABS 10.1* 6.1 3.9  HGB 14.2 14.3 14.1  HCT 44.1 44.8 43.8  MCV 84.2 84.5 84.7  PLT 208 217 236   Lipid Panel: Recent Labs    11/03/19 1254 04/01/20 1011  CHOL  --  232*  HDL  --  44  LDLCALC  --  11/07/19*  TRIG 113 244*  CHOLHDL  --  5.3*   TSH: No results for input(s): TSH in the last 8760 hours. A1C: Lab Results  Component Value Date   HGBA1C 5.6 04/01/2020     Assessment/Plan 1. Chronic pain syndrome -he has been on routine tramadol TID due to chronic pain to back and shoulders. Recent UDS inconsistent with prescribed medication. Discussed with pt and he states he is willing to wean off tramadol (vs going to pain management) feels like he can manage pain with tylenol, NSAIDs, PT and exercises.  -discussed the use of naproxen or Rx NSAID if needed or referral to specialist for further evaluation regarding pain. He is due for tramadol refill tomorrow however will receive #45 ONLY and will wean himself off with this medication. He feels like he is able to do this.  Weaning scheduled available if needed. He will not get another refill after this   Next appt: 09/30/2020 05/30/20. [address]  [facility] [phone]    Virtual Visit via Janene Harvey  I connected with patient on 04/07/20 at  8:30 AM EST by video and verified that I am speaking with the correct person using two identifiers.  Location: Patient: work [facility]: [place]   I discussed the limitations, risks, security and privacy concerns of performing an evaluation  and management service by telephone and the availability of in person appointments. I also discussed with the patient that there may be a patient responsible charge related to this service. The patient expressed understanding and agreed to proceed.   I discussed the assessment and treatment plan with the patient. The patient was provided an opportunity to ask questions and all were answered. The patient agreed with the plan and demonstrated an understanding of the instructions.   The patient was advised to call back or  seek an in-person evaluation if the symptoms worsen or if the condition fails to improve as anticipated.  I provided 18 minutes of non-face-to-face time during this encounter.  Janene Harvey. [employer] printed and mailed

## 2020-04-07 NOTE — Telephone Encounter (Signed)
Taylor Kidd, Taylor Kidd are scheduled for a virtual visit with your provider today.    Just as we do with appointments in the office, we must obtain your consent to participate.  Your consent will be active for this visit and any virtual visit you may have with one of our providers in the next 365 days.    If you have a MyChart account, I can also send a copy of this consent to you electronically.  All virtual visits are billed to your insurance company just like a traditional visit in the office.  As this is a virtual visit, video technology does not allow for your provider to perform a traditional examination.  This may limit your provider's ability to fully assess your condition.  If your provider identifies any concerns that need to be evaluated in person or the need to arrange testing such as labs, EKG, etc, we will make arrangements to do so.    Although advances in technology are sophisticated, we cannot ensure that it will always work on either your end or our end.  If the connection with a video visit is poor, we may have to switch to a telephone visit.  With either a video or telephone visit, we are not always able to ensure that we have a secure connection.   I need to obtain your verbal consent now.   Are you willing to proceed with your visit today?   Taylor Kidd has provided verbal consent on 04/07/2020 for a virtual visit (video or telephone).   Elveria Royals, [facility] 04/07/2020  8:27 AM

## 2020-04-08 ENCOUNTER — Other Ambulatory Visit: Payer: Self-pay | Admitting: Nurse Practitioner

## 2020-04-08 DIAGNOSIS — M546 Pain in thoracic spine: Secondary | ICD-10-CM

## 2020-04-08 DIAGNOSIS — G894 Chronic pain syndrome: Secondary | ICD-10-CM

## 2020-04-08 DIAGNOSIS — M5441 Lumbago with sciatica, right side: Secondary | ICD-10-CM

## 2020-04-08 DIAGNOSIS — G8929 Other chronic pain: Secondary | ICD-10-CM

## 2020-04-08 NOTE — Telephone Encounter (Signed)
Patient has request refill on medication "Tramadol 50 mg". Patient last refill was 03/09/2020. Patient drug screen didn't show medication in system. Medication pend and sent to PCP Sharon Seller, NP . Please Advise.

## 2020-05-01 ENCOUNTER — Other Ambulatory Visit: Payer: Self-pay | Admitting: Family

## 2020-05-01 DIAGNOSIS — R7989 Other specified abnormal findings of blood chemistry: Secondary | ICD-10-CM

## 2020-05-04 NOTE — Telephone Encounter (Signed)
RX last filled in Epic on 01/10/2020, No treatment agreement on file for requested medication.

## 2020-05-04 NOTE — Telephone Encounter (Signed)
Please make appt for pt to come in to get PSA, he has not had in a year. Thank you

## 2020-05-04 NOTE — Addendum Note (Signed)
Addended by: Maurice Small on: 05/04/2020 04:54 PM   Modules accepted: Orders

## 2020-05-05 MED ORDER — TESTOSTERONE 30 MG/ACT TD SOLN: TRANSDERMAL | 1 refills | Status: DC

## 2020-05-05 NOTE — Telephone Encounter (Signed)
Spoke with patient, scheduled appointment for 05/26/2020 to check PSA. Order for PSA Pended for Sharon Seller, NP to review and sign (needs confirmation test is associated with the appropriate diagnosis code)

## 2020-05-05 NOTE — Addendum Note (Signed)
Addended by: Sharon Seller on: 05/05/2020 02:30 PM   Modules accepted: Orders

## 2020-05-05 NOTE — Addendum Note (Signed)
Addended by: Sharon Seller on: 05/05/2020 02:38 PM   Modules accepted: Orders

## 2020-05-05 NOTE — Addendum Note (Signed)
Addended by: Maurice Small on: 05/05/2020 01:39 PM   Modules accepted: Orders

## 2020-05-26 ENCOUNTER — Other Ambulatory Visit: Payer: Self-pay

## 2020-05-26 DIAGNOSIS — R7989 Other specified abnormal findings of blood chemistry: Secondary | ICD-10-CM

## 2020-06-19 ENCOUNTER — Telehealth: Payer: Self-pay | Admitting: Nurse Practitioner

## 2020-06-19 NOTE — Telephone Encounter (Signed)
Left message to call back to schedule labs.

## 2020-09-30 ENCOUNTER — Ambulatory Visit: Payer: BC Managed Care – PPO | Admitting: Nurse Practitioner

## 2020-09-30 ENCOUNTER — Telehealth: Payer: Self-pay

## 2020-09-30 NOTE — Telephone Encounter (Signed)
Left message on voicemail for patient to return call when available , reason for call: Patient needs to call and reschedule his missed appointment for today (second no show). It is important for patient to have an appointment in order to continue to receive refills.  Left a second message on patients spouses voicemail, requesting that she inform her husband of missed appointment.   Patient can return call and speak wit whomever answers the phone to reschedule

## 2020-10-09 ENCOUNTER — Other Ambulatory Visit: Payer: Self-pay

## 2020-10-09 ENCOUNTER — Encounter: Payer: Self-pay | Admitting: Nurse Practitioner

## 2020-10-09 ENCOUNTER — Ambulatory Visit: Payer: BC Managed Care – PPO | Admitting: Nurse Practitioner

## 2020-10-09 VITALS — BP 126/88 | HR 77 | Temp 97.9°F | Ht 75.0 in | Wt 253.0 lb

## 2020-10-09 DIAGNOSIS — E559 Vitamin D deficiency, unspecified: Secondary | ICD-10-CM

## 2020-10-09 DIAGNOSIS — E782 Mixed hyperlipidemia: Secondary | ICD-10-CM | POA: Diagnosis not present

## 2020-10-09 DIAGNOSIS — G8929 Other chronic pain: Secondary | ICD-10-CM

## 2020-10-09 DIAGNOSIS — E669 Obesity, unspecified: Secondary | ICD-10-CM

## 2020-10-09 DIAGNOSIS — R739 Hyperglycemia, unspecified: Secondary | ICD-10-CM | POA: Diagnosis not present

## 2020-10-09 DIAGNOSIS — M546 Pain in thoracic spine: Secondary | ICD-10-CM

## 2020-10-09 DIAGNOSIS — K2100 Gastro-esophageal reflux disease with esophagitis, without bleeding: Secondary | ICD-10-CM

## 2020-10-09 DIAGNOSIS — R7989 Other specified abnormal findings of blood chemistry: Secondary | ICD-10-CM

## 2020-10-09 LAB — CBC WITH DIFFERENTIAL/PLATELET
Absolute Monocytes: 444 cells/uL (ref 200–950)
Basophils Absolute: 26 cells/uL (ref 0–200)
Basophils Relative: 0.3 %
Eosinophils Absolute: 104 cells/uL (ref 15–500)
Eosinophils Relative: 1.2 %
HCT: 48.7 % (ref 38.5–50.0)
Hemoglobin: 16 g/dL (ref 13.2–17.1)
Lymphs Abs: 1827 cells/uL (ref 850–3900)
MCH: 27.2 pg (ref 27.0–33.0)
MCHC: 32.9 g/dL (ref 32.0–36.0)
MCV: 82.8 fL (ref 80.0–100.0)
MPV: 9.4 fL (ref 7.5–12.5)
Monocytes Relative: 5.1 %
Neutro Abs: 6299 cells/uL (ref 1500–7800)
Neutrophils Relative %: 72.4 %
Platelets: 275 10*3/uL (ref 140–400)
RBC: 5.88 10*6/uL — ABNORMAL HIGH (ref 4.20–5.80)
RDW: 12.8 % (ref 11.0–15.0)
Total Lymphocyte: 21 %
WBC: 8.7 10*3/uL (ref 3.8–10.8)

## 2020-10-09 LAB — LIPID PANEL
Cholesterol: 166 mg/dL (ref <200)
HDL: 45 mg/dL (ref >=40)
LDL Cholesterol (Calc): 98 mg/dL (calc)
Non-HDL Cholesterol (Calc): 121 mg/dL (calc) (ref <130)
Total CHOL/HDL Ratio: 3.7 (calc) (ref <5.0)
Triglycerides: 124 mg/dL (ref <150)

## 2020-10-09 LAB — COMPLETE METABOLIC PANEL WITH GFR
AG Ratio: 1.7 (calc) (ref 1.0–2.5)
ALT: 25 U/L (ref 9–46)
AST: 23 U/L (ref 10–35)
Albumin: 4.3 g/dL (ref 3.6–5.1)
Alkaline phosphatase (APISO): 91 U/L (ref 35–144)
BUN: 22 mg/dL (ref 7–25)
CO2: 26 mmol/L (ref 20–32)
Calcium: 9.7 mg/dL (ref 8.6–10.3)
Chloride: 105 mmol/L (ref 98–110)
Creat: 0.82 mg/dL (ref 0.70–1.30)
Globulin: 2.6 g/dL (calc) (ref 1.9–3.7)
Glucose, Bld: 96 mg/dL (ref 65–99)
Potassium: 4.3 mmol/L (ref 3.5–5.3)
Sodium: 139 mmol/L (ref 135–146)
Total Bilirubin: 0.6 mg/dL (ref 0.2–1.2)
Total Protein: 6.9 g/dL (ref 6.1–8.1)
eGFR: 107 mL/min/{1.73_m2} (ref >=60)

## 2020-10-09 LAB — TESTOSTERONE: Testosterone: 797 ng/dL (ref 250–827)

## 2020-10-09 LAB — PSA: PSA: 0.69 ng/mL (ref <=4.00)

## 2020-10-09 NOTE — Patient Instructions (Addendum)
Put COLOGUARD in the bathroom and do it :)   Get Shingles vaccine at local pharmacy  - Use a mild, unscented soap.  You can use what you like (Dove, Aveeno, etc). - Bathe every other day if possible.  - After the bath apply a good lotion (unscented).   -recommendations include- Aveeno eczema, Aquafor, Eucerin. -make sure to stay hydrated.

## 2020-10-09 NOTE — Progress Notes (Signed)
Careteam: Patient Care Team: Lauree Chandler, NP as PCP - General (Geriatric Medicine) Suella Broad, MD as Consulting Physician (Physical Medicine and Rehabilitation) Berle Mull, MD as Consulting Physician (Sports Medicine)  PLACE OF SERVICE:  [place] Directive information Does Patient Have a Medical Advance Directive?: No, Would patient like information on creating a medical advance directive?: No - Patient declined  No Known Allergies  Chief Complaint  Patient presents with   Medical Management of Chronic Issues    6 month follow-up. Discuss need for colonoscopy, eye exam, Malb, A1c, pneumonia vaccine, shingrix, covid, and flu (not in stock at [facility]) vaccines or exclude. Foot exam today.      HPI: Patient is a 50 y.o. male for follow up.   Reports he does use testosterone daily.   Hyperglycemia- in review he does not meet criteria for diabetes. He was on metformin with an a1c of 5.6, there has been no elevated A1c over 5.7.   Hyperlipidemia- has a tendency to forget his statin. Reports he is doing better.   No concerns.   Chronic back pain- wanted PT but then his phone has been messed up he reports he will call them.   Has cologuard at home, needs to complete.   Review of Systems:  Review of Systems  Constitutional:  Negative for chills, fever and weight loss.  HENT:  Negative for tinnitus.   Respiratory:  Negative for cough, sputum production and shortness of breath.   Cardiovascular:  Negative for chest pain, palpitations and leg swelling.  Gastrointestinal:  Negative for abdominal pain, constipation, diarrhea and heartburn.  Genitourinary:  Negative for dysuria, frequency and urgency.  Musculoskeletal:  Positive for back pain. Negative for falls, joint pain and myalgias.  Skin: Negative.   Neurological:  Negative for dizziness and headaches.  Psychiatric/Behavioral:  Negative for depression and memory loss. The patient does not have insomnia.     Past Medical History:  Diagnosis Date   Anxiety    Arthritis    Depression    Diabetes mellitus without complication (HCC)    GERD (gastroesophageal reflux disease)    Heartburn    Hematuria, unspecified    Lumbago    Other abnormal blood chemistry    Other malaise and fatigue    Palpitations    Unspecified vitamin D deficiency    Past Surgical History:  Procedure Laterality Date   SHOULDER ARTHROSCOPY WITH ROTATOR CUFF REPAIR AND SUBACROMIAL DECOMPRESSION Right 12/22/2016   Procedure: Right shoulder arthroscopy, subacromial decompression, mini open rotator cuff repair;  Surgeon: Susa Day, MD;  Location: [facility];  Service: Orthopedics;  Laterality: Right;  120 mins   Social History:   reports that he quit smoking about 3 years ago. His smoking use included cigarettes. He has a 15.00 pack-year smoking history. He has never used smokeless tobacco. He reports current alcohol use. He reports that he does not use drugs.  History reviewed. No pertinent family history.  Medications: Patient's Medications  New Prescriptions   No medications on file  Previous Medications   ACETAMINOPHEN (TYLENOL) 500 MG TABLET    Take 500 mg by mouth as needed for mild pain.    ATORVASTATIN (LIPITOR) 20 MG TABLET    Take 1 tablet (20 mg total) by mouth daily.   CHOLECALCIFEROL (VITAMIN D3) 25 MCG (1000 UNIT) TABLET    Take 1,000 Units by mouth 3 (three) times a week.   DIPHENHYDRAMINE-APAP, SLEEP, (GOODY PM PO)    Take 1  Package by mouth at bedtime as needed (sleep).   TESTOSTERONE 30 MG/ACT SOLN    APPLY ONE PUMP UNDER THE ARM ONCE DAILY   VITAMIN B-12 (CYANOCOBALAMIN) 1000 MCG TABLET    Take 1,000 mcg by mouth daily.  Modified Medications   No medications on file  Discontinued Medications   TRAMADOL (ULTRAM) 50 MG TABLET    Take 1 tablet (50 mg total) by mouth every 12 (twelve) hours as needed. This will be last refill.    Physical Exam:  Vitals:   10/09/20 0846  BP: 126/88  Pulse: 77   Temp: 97.9 F (36.6 C)  SpO2: 98%  Weight: 253 lb (114.8 kg)  Height: '6\' 3"'  (1.905 m)   Body mass index is 31.62 kg/m. Wt Readings from Last 3 Encounters:  10/09/20 253 lb (114.8 kg)  04/01/20 246 lb 3.2 oz (111.7 kg)  11/11/19 247 lb (112 kg)    Physical Exam Constitutional:      General: He is not in acute distress.    Appearance: He is well-developed. He is not diaphoretic.  HENT:     Head: Normocephalic and atraumatic.     Right Ear: External ear normal.     Left Ear: External ear normal.     Mouth/Throat:     Pharynx: No oropharyngeal exudate.  Eyes:     Conjunctiva/sclera: Conjunctivae normal.     Pupils: Pupils are equal, round, and reactive to light.  Cardiovascular:     Rate and Rhythm: Normal rate and regular rhythm.     Heart sounds: Normal heart sounds.  Pulmonary:     Effort: Pulmonary effort is normal.     Breath sounds: Normal breath sounds.  Abdominal:     General: Bowel sounds are normal.     Palpations: Abdomen is soft.  Musculoskeletal:        General: No tenderness.     Cervical back: Normal range of motion and neck supple.     Right lower leg: No edema.     Left lower leg: No edema.  Skin:    General: Skin is warm and dry.  Neurological:     Mental Status: He is alert and oriented to person, place, and time.    Labs reviewed: Basic Metabolic Panel: Recent Labs    11/04/19 0421 11/05/19 0508 04/01/20 1011  NA 138 135 138  K 3.6 3.5 4.5  CL 99 97* 100  CO2 '29 29 31  ' GLUCOSE 93 100* 71  BUN '9 12 18  ' CREATININE 0.70 0.93 0.81  CALCIUM 9.3 9.1 10.0   Liver Function Tests: Recent Labs    11/03/19 0539 11/04/19 0421 11/05/19 0508 04/01/20 1011  AST '16 15 17 24  ' ALT '19 19 22 25  ' ALKPHOS 73 80 71  --   BILITOT 1.5* 0.8 0.5 0.5  PROT 6.8 6.7 6.5 7.3  ALBUMIN 3.6 3.4* 3.3*  --    Recent Labs    11/02/19 0920 11/05/19 0508  LIPASE 144* 22   No results for input(s): AMMONIA in the last 8760 hours. CBC: Recent Labs     11/04/19 0421 11/05/19 0508 11/06/19 0528  WBC 13.4* 9.2 6.8  NEUTROABS 10.1* 6.1 3.9  HGB 14.2 14.3 14.1  HCT 44.1 44.8 43.8  MCV 84.2 84.5 84.7  PLT 208 217 236   Lipid Panel: Recent Labs    11/03/19 1254 04/01/20 1011  CHOL  --  232*  HDL  --  44  LDLCALC  --  149*  TRIG 113 244*  CHOLHDL  --  5.3*   TSH: No results for input(s): TSH in the last 8760 hours. A1C: Lab Results  Component Value Date   HGBA1C 5.6 04/01/2020     Assessment/Plan 1. Low testosterone -continues testosterone daily - PSA - CBC with Differential/Platelet - Testosterone  2. Obesity (BMI 30-39.9) -education provided on healthy weight loss through increase in physical activity and proper nutrition   3. Hyperglycemia -in review of his chart no elevated A1c or serial fasting glucose to confirm diabetes. He has has hyperglycemia noted on lab in past. There is no indication of diabetes at this time but did educate him on low carb/low sugar diet.  4. Mixed hyperlipidemia -stressed importance of compliance with statin and diet.  - Lipid panel - CMP with eGFR([facility]) - CBC with Differential/Platelet  5. Vitamin D deficiency -continue on supplement.  6. Chronic right-sided thoracic back pain Stable at this time. He has exercises he can do. He had PT referral but never called back to schedule session. Reports he will call to make appt.  7. Gastroesophageal reflux disease with esophagitis without hemorrhage Stable. Uses tums PRN. Diet controlled.    Next appt: 6 months. Carlos American. [place], [facility] [phone]

## 2020-12-08 ENCOUNTER — Other Ambulatory Visit: Payer: Self-pay | Admitting: Nurse Practitioner

## 2020-12-08 DIAGNOSIS — R7989 Other specified abnormal findings of blood chemistry: Secondary | ICD-10-CM

## 2020-12-09 MED ORDER — TESTOSTERONE 30 MG/ACT TD SOLN: TRANSDERMAL | 0 refills | Status: DC

## 2020-12-09 NOTE — Telephone Encounter (Signed)
Patient has request refill on Testosterone. Patient last refill was 05/05/2020 for 23ml with 1 refill. Patient doesn't have contract. Update contract added to patient next appointment note. Medication pend and sent to PCP Janyth Contes Janene Harvey, NP for refill.

## 2020-12-09 NOTE — Addendum Note (Signed)
Addended by: Meda Klinefelter E on: 12/09/2020 08:44 AM   Modules accepted: Orders

## 2020-12-10 ENCOUNTER — Encounter (HOSPITAL_COMMUNITY): Payer: Self-pay

## 2020-12-10 ENCOUNTER — Emergency Department (HOSPITAL_COMMUNITY)
Admission: EM | Admit: 2020-12-10 | Discharge: 2020-12-10 | Disposition: A | Discharge status: Home / Self Care | Payer: BC Managed Care – PPO | Attending: Emergency Medicine | Admitting: Emergency Medicine

## 2020-12-10 ENCOUNTER — Emergency Department (HOSPITAL_COMMUNITY): Payer: BC Managed Care – PPO

## 2020-12-10 ENCOUNTER — Other Ambulatory Visit: Payer: Self-pay

## 2020-12-10 ENCOUNTER — Ambulatory Visit (INDEPENDENT_AMBULATORY_CARE_PROVIDER_SITE_OTHER): Payer: BC Managed Care – PPO | Admitting: Family

## 2020-12-10 ENCOUNTER — Encounter: Payer: Self-pay | Admitting: Family

## 2020-12-10 VITALS — BP 122/72 | HR 92 | Temp 97.1°F | Resp 18 | Ht 75.0 in | Wt 255.6 lb

## 2020-12-10 DIAGNOSIS — R1011 Right upper quadrant pain: Secondary | ICD-10-CM

## 2020-12-10 DIAGNOSIS — R1084 Generalized abdominal pain: Secondary | ICD-10-CM | POA: Diagnosis present

## 2020-12-10 DIAGNOSIS — E119 Type 2 diabetes mellitus without complications: Secondary | ICD-10-CM | POA: Diagnosis not present

## 2020-12-10 DIAGNOSIS — R1012 Left upper quadrant pain: Secondary | ICD-10-CM

## 2020-12-10 DIAGNOSIS — K859 Acute pancreatitis without necrosis or infection, unspecified: Secondary | ICD-10-CM | POA: Insufficient documentation

## 2020-12-10 DIAGNOSIS — Z87891 Personal history of nicotine dependence: Secondary | ICD-10-CM | POA: Diagnosis not present

## 2020-12-10 LAB — CBC WITH DIFFERENTIAL/PLATELET
Abs Immature Granulocytes: 0.05 10*3/uL (ref 0.00–0.07)
Absolute Monocytes: 422 cells/uL (ref 200–950)
Basophils Absolute: 33 cells/uL (ref 0–200)
Basophils Absolute: 0.1 10*3/uL (ref 0.0–0.1)
Basophils Relative: 0.3 %
Basophils Relative: 0 %
Eosinophils Absolute: 122 cells/uL (ref 15–500)
Eosinophils Absolute: 0.2 10*3/uL (ref 0.0–0.5)
Eosinophils Relative: 1.1 %
Eosinophils Relative: 1 %
HCT: 46.5 % (ref 38.5–50.0)
HCT: 47.1 % (ref 39.0–52.0)
Hemoglobin: 15.8 g/dL (ref 13.2–17.1)
Hemoglobin: 15.9 g/dL (ref 13.0–17.0)
Immature Granulocytes: 0 %
Lymphocytes Relative: 19 %
Lymphs Abs: 1621 cells/uL (ref 850–3900)
Lymphs Abs: 2.4 10*3/uL (ref 0.7–4.0)
MCH: 28 pg (ref 27.0–33.0)
MCH: 27.9 pg (ref 26.0–34.0)
MCHC: 34 g/dL (ref 32.0–36.0)
MCHC: 33.8 g/dL (ref 30.0–36.0)
MCV: 82.4 fL (ref 80.0–100.0)
MCV: 82.8 fL (ref 80.0–100.0)
MPV: 9.1 fL (ref 7.5–12.5)
Monocytes Absolute: 0.8 10*3/uL (ref 0.1–1.0)
Monocytes Relative: 3.8 %
Monocytes Relative: 6 %
Neutro Abs: 8902 cells/uL — ABNORMAL HIGH (ref 1500–7800)
Neutro Abs: 9.4 10*3/uL — ABNORMAL HIGH (ref 1.7–7.7)
Neutrophils Relative %: 80.2 %
Neutrophils Relative %: 74 %
Platelets: 251 10*3/uL (ref 140–400)
Platelets: 260 10*3/uL (ref 150–400)
RBC: 5.64 10*6/uL (ref 4.20–5.80)
RBC: 5.69 MIL/uL (ref 4.22–5.81)
RDW: 12.4 % (ref 11.0–15.0)
RDW: 12.5 % (ref 11.5–15.5)
Total Lymphocyte: 14.6 %
WBC: 11.1 10*3/uL — ABNORMAL HIGH (ref 3.8–10.8)
WBC: 12.8 10*3/uL — ABNORMAL HIGH (ref 4.0–10.5)
nRBC: 0 % (ref 0.0–0.2)

## 2020-12-10 LAB — COMPREHENSIVE METABOLIC PANEL
ALT: 24 U/L (ref 0–44)
AST: 22 U/L (ref 15–41)
Albumin: 4.2 g/dL (ref 3.5–5.0)
Alkaline Phosphatase: 95 U/L (ref 38–126)
Anion gap: 7 (ref 5–15)
BUN: 22 mg/dL — ABNORMAL HIGH (ref 6–20)
CO2: 26 mmol/L (ref 22–32)
Calcium: 9.5 mg/dL (ref 8.9–10.3)
Chloride: 103 mmol/L (ref 98–111)
Creatinine, Ser: 0.93 mg/dL (ref 0.61–1.24)
GFR, Estimated: >60 mL/min (ref >60)
Glucose, Bld: 87 mg/dL (ref 70–99)
Potassium: 4.1 mmol/L (ref 3.5–5.1)
Sodium: 136 mmol/L (ref 135–145)
Total Bilirubin: 0.7 mg/dL (ref 0.3–1.2)
Total Protein: 7.6 g/dL (ref 6.5–8.1)

## 2020-12-10 LAB — URINALYSIS, ROUTINE W REFLEX MICROSCOPIC
Bacteria, UA: NONE SEEN
Bilirubin Urine: NEGATIVE
Glucose, UA: NEGATIVE mg/dL
Ketones, ur: NEGATIVE mg/dL
Leukocytes,Ua: NEGATIVE
Nitrite: NEGATIVE
Protein, ur: NEGATIVE mg/dL
Specific Gravity, Urine: 1.04 — ABNORMAL HIGH (ref 1.005–1.030)
pH: 6 (ref 5.0–8.0)

## 2020-12-10 LAB — COMPLETE METABOLIC PANEL WITH GFR
AG Ratio: 1.5 (calc) (ref 1.0–2.5)
ALT: 21 U/L (ref 9–46)
AST: 17 U/L (ref 10–35)
Albumin: 4.1 g/dL (ref 3.6–5.1)
Alkaline phosphatase (APISO): 101 U/L (ref 35–144)
BUN: 22 mg/dL (ref 7–25)
CO2: 28 mmol/L (ref 20–32)
Calcium: 9.7 mg/dL (ref 8.6–10.3)
Chloride: 102 mmol/L (ref 98–110)
Creat: 0.91 mg/dL (ref 0.70–1.30)
Globulin: 2.7 g/dL (calc) (ref 1.9–3.7)
Glucose, Bld: 126 mg/dL (ref 65–139)
Potassium: 3.9 mmol/L (ref 3.5–5.3)
Sodium: 137 mmol/L (ref 135–146)
Total Bilirubin: 0.7 mg/dL (ref 0.2–1.2)
Total Protein: 6.8 g/dL (ref 6.1–8.1)
eGFR: 103 mL/min/{1.73_m2} (ref >=60)

## 2020-12-10 LAB — LIPASE, BLOOD: Lipase: 462 U/L — ABNORMAL HIGH (ref 11–51)

## 2020-12-10 LAB — AMYLASE: Amylase: 363 U/L — ABNORMAL HIGH (ref 21–101)

## 2020-12-10 LAB — LIPASE: Lipase: 1197 U/L — ABNORMAL HIGH (ref 7–60)

## 2020-12-10 MED ORDER — HYDROMORPHONE HCL 1 MG/ML IJ SOLN
1.0000 mg | Freq: Once | INTRAMUSCULAR | Status: AC
  Administered 2020-12-10: 1 mg via INTRAVENOUS
  Filled 2020-12-10: qty 1

## 2020-12-10 MED ORDER — HYDROCODONE-ACETAMINOPHEN 5-325 MG PO TABS: 2.0000 | ORAL_TABLET | Freq: Four times a day (QID) | ORAL | 0 refills | Status: DC | PRN

## 2020-12-10 MED ORDER — ONDANSETRON HCL 4 MG/2ML IJ SOLN
4.0000 mg | Freq: Once | INTRAMUSCULAR | Status: AC
  Administered 2020-12-10: 4 mg via INTRAVENOUS
  Filled 2020-12-10: qty 2

## 2020-12-10 MED ORDER — IOHEXOL 350 MG/ML SOLN
80.0000 mL | Freq: Once | INTRAVENOUS | Status: AC | PRN
  Administered 2020-12-10: 80 mL via INTRAVENOUS

## 2020-12-10 NOTE — ED Provider Notes (Signed)
[facility]-EMERGENCY DEPT Provider Note   CSN: [id number] Arrival date & time: 12/10/20  1422     History Chief Complaint  Patient presents with   Abdominal Pain    Taylor Kidd is a 50 y.o. male.  The history is provided by the patient and a parent. No language interpreter was used.  Abdominal Pain Pain location:  Generalized Pain quality: aching   Pain radiates to:  Does not radiate Pain severity:  Moderate Onset quality:  Gradual Progression:  Worsening Chronicity:  New Relieved by:  Nothing Worsened by:  Nothing Ineffective treatments:  None tried Associated symptoms: nausea   Associated symptoms: no fever   Risk factors: no alcohol abuse   Pt reports he has had pancreatitis in the past. Pt reports increased upper abdominal pain.      Past Medical History:  Diagnosis Date   Anxiety    Arthritis    Depression    Diabetes mellitus without complication (HCC)    GERD (gastroesophageal reflux disease)    Heartburn    Hematuria, unspecified    Lumbago    Other abnormal blood chemistry    Other malaise and fatigue    Palpitations    Unspecified vitamin D deficiency     Patient Active Problem List   Diagnosis Date Noted   Pancreatitis 11/02/2019   Right shoulder pain 03/29/2016   Low testosterone 12/03/2014   Anxiety state 04/08/2014   Vitamin D deficiency 06/26/2013   Hyperlipidemia 06/26/2013   Chronic pain syndrome 09/18/2009   GERD 09/18/2009   DISC DISEASE, LUMBAR 09/18/2009   LOW BACK PAIN, CHRONIC 09/18/2009    Past Surgical History:  Procedure Laterality Date   SHOULDER ARTHROSCOPY WITH ROTATOR CUFF REPAIR AND SUBACROMIAL DECOMPRESSION Right 12/22/2016   Procedure: Right shoulder arthroscopy, subacromial decompression, mini open rotator cuff repair;  Surgeon: Jene Every, MD;  Location: [facility];  Service: Orthopedics;  Laterality: Right;  120 mins       History reviewed. No pertinent family history.  Social History    Tobacco Use   Smoking status: Former    Packs/day: 0.50    Years: 30.00    Pack years: 15.00    Types: Cigarettes    Quit date: 03/23/2017    Years since quitting: 3.7   Smokeless tobacco: Never  Vaping Use   Vaping Use: Former  Substance Use Topics   Alcohol use: Yes    Alcohol/week: 0.0 standard drinks    Comment: rarely    Drug use: No    Home Medications Prior to Admission medications   Medication Sig Start Date End Date Taking? Authorizing Provider  acetaminophen (TYLENOL) 500 MG tablet Take 500 mg by mouth as needed for mild pain.     [provider]  atorvastatin (LIPITOR) 20 MG tablet TAKE 1 TABLET BY MOUTH EVERY DAY 12/09/20   Sharon Seller, NP  cholecalciferol (VITAMIN D3) 25 MCG (1000 UNIT) tablet Take 1,000 Units by mouth 3 (three) times a week.    [provider]  diphenhydrAMINE-APAP, sleep, (GOODY PM PO) Take 1 Package by mouth at bedtime as needed (sleep).    [provider]  Testosterone 30 MG/ACT SOLN APPLY ONE PUMP UNDER THE ARM ONCE DAILY 12/09/20   Sharon Seller, NP  vitamin B-12 (CYANOCOBALAMIN) 1000 MCG tablet Take 1,000 mcg by mouth daily.    [provider]    Allergies    Patient has no known allergies.  Review of Systems   Review  of Systems  Constitutional:  Negative for fever.  Gastrointestinal:  Positive for abdominal pain and nausea.  All other systems reviewed and are negative.  Physical Exam Updated Vital Signs BP 128/85   Pulse 80   Temp 97.9 F (36.6 C)   Resp 18   Ht 6\' 3"  (1.905 m)   Wt 115.9 kg   SpO2 94%   BMI 31.94 kg/m   Physical Exam Vitals reviewed.  HENT:     Head: Normocephalic.  Cardiovascular:     Rate and Rhythm: Normal rate.  Pulmonary:     Effort: Pulmonary effort is normal.  Abdominal:     General: Abdomen is flat. Bowel sounds are normal.     Palpations: Abdomen is soft.     Tenderness: There is abdominal tenderness in the epigastric area.  Skin:     General: Skin is warm.  Neurological:     General: No focal deficit present.     Mental Status: He is alert.    ED Results / Procedures / Treatments   Labs (all labs ordered are listed, but only abnormal results are displayed) Labs Reviewed  LIPASE, BLOOD - Abnormal; Notable for the following components:      Result Value   Lipase 462 (*)    All other components within normal limits  CBC WITH DIFFERENTIAL/PLATELET - Abnormal; Notable for the following components:   WBC 12.8 (*)    Neutro Abs 9.4 (*)    All other components within normal limits  COMPREHENSIVE METABOLIC PANEL - Abnormal; Notable for the following components:   BUN 22 (*)    All other components within normal limits  URINALYSIS, ROUTINE W REFLEX MICROSCOPIC - Abnormal; Notable for the following components:   Specific Gravity, Urine 1.040 (*)    Hgb urine dipstick SMALL (*)    All other components within normal limits    EKG None  Radiology CT ABDOMEN PELVIS W CONTRAST  Result Date: 12/10/2020 CLINICAL DATA:  Epigastric pain, history of pancreatitis EXAM: CT ABDOMEN AND PELVIS WITH CONTRAST TECHNIQUE: Multidetector CT imaging of the abdomen and pelvis was performed using the standard protocol following bolus administration of intravenous contrast. CONTRAST:  67mL OMNIPAQUE IOHEXOL 350 MG/ML SOLN COMPARISON:  11/02/2019 FINDINGS: Lower chest: No acute pleural or parenchymal lung disease. Hepatobiliary: No focal liver abnormality is seen. No gallstones, gallbladder wall thickening, or biliary dilatation. Pancreas: Mild peripancreatic fat stranding consistent with acute uncomplicated pancreatitis. Pancreatic parenchyma enhances normally. No fluid collection, pseudocyst, or abscess. Spleen: Normal in size without focal abnormality. Adrenals/Urinary Tract: Adrenal glands are unremarkable. Kidneys are normal, without renal calculi, focal lesion, or hydronephrosis. Bladder is unremarkable. Stomach/Bowel: No bowel obstruction  or ileus. Normal appendix right lower quadrant. No bowel wall thickening or inflammatory change. Vascular/Lymphatic: No significant vascular findings are present. No enlarged abdominal or pelvic lymph nodes. Reproductive: Prostate is unremarkable. Other: No free fluid or free gas. There is a small fat containing umbilical hernia. Musculoskeletal: No acute or destructive bony lesions. Reconstructed images demonstrate no additional findings. IMPRESSION: 1. Mild acute uncomplicated pancreatitis. No fluid collection, pseudocyst, or abscess. 2. Stable fat containing umbilical hernia. Electronically Signed   By: 01/02/2020 M.D.   On: 12/10/2020 19:03    Procedures Procedures   Medications Ordered in ED Medications  iohexol (OMNIPAQUE) 350 MG/ML injection 80 mL (80 mLs Intravenous Contrast Given 12/10/20 1821)  HYDROmorphone (DILAUDID) injection 1 mg (1 mg Intravenous Given 12/10/20 2041)  ondansetron (ZOFRAN) injection 4 mg (4 mg Intravenous Given  12/10/20 2041)    ED Course  I have reviewed the triage vital signs and the nursing notes.  Pertinent labs & imaging results that were available during my care of the patient were reviewed by me and considered in my medical decision making (see chart for details).  Clinical Course as of 12/10/20 2124  Thu Dec 10, 2020  1943 Urinalysis, Routine w reflex microscopic Urine, Clean Catch [KR]    Clinical Course User Index [KR] Janae Bridgeman, [place] Neighbor   MDM Rules/Calculators/A&P                           MDM:  ct scan shows pancreatitis. Lipase is 462 and wbc count is elevated to 12.8.  Results reviewed, interpreted and  discussed pt.  He is advised clear liquids for the next 24 hours,  progress diet slowly.  Avoid alcohol  Final Clinical Impression(s) / ED Diagnoses Final diagnoses:  Acute pancreatitis, unspecified complication status, unspecified pancreatitis type    Rx / DC Orders ED Discharge Orders          Ordered     HYDROcodone-acetaminophen (NORCO/VICODIN) 5-325 MG tablet  Every 6 hours PRN        12/10/20 2133          An After Visit Summary was printed and given to the patient.    Osie Cheeks 12/10/20 2134    Lorre Nick, MD 12/10/20 2232

## 2020-12-10 NOTE — Patient Instructions (Signed)
Please go to ED if symptoms worsen.  

## 2020-12-10 NOTE — Progress Notes (Signed)
Provider: Indea Dearman FNP-C  Lauree Chandler, NP  Patient Care Team: Lauree Chandler, NP as PCP - General (Geriatric Medicine) Suella Broad, MD as Consulting Physician (Physical Medicine and Rehabilitation) Berle Mull, MD as Consulting Physician (Sports Medicine)  Extended Emergency Contact Information Primary Emergency Contact: [facility] Address: [address]          [place], [place] [address] Johnnette Litter of [place] Phone: [phone] Mobile Phone: [phone] Relation: Spouse  Code Status:  Full Code  Goals of care: Advanced Directive information Advanced Directives 12/10/2020  Does Patient Have a Medical Advance Directive? No  Type of Advance Directive -  Copy of [place] in Chart? -  Would patient like information on creating a medical advance directive? No - Patient declined     Chief Complaint  Patient presents with   Acute Visit    Patient complains of pancreatic friction.    HPI:  Pt is a 50 y.o. male seen today for an acute visit for evaluation of pancreatic friction.states woke up this morning with upper abdominal pain.could not sit up this morning.Hunts to lay flat or on the side.Hard to cough and tender to press across the upper Abdomen.Felt like going to ED early this morning but pain improved so decided to come to the office.Has had similar pain in the past was admitted for pancreatitis.would like to get lab done prior to going to ED to see if it's the pancreases again. He denies any nausea or vomiting Drunk 3-4 beers on Sunday.  No fever or chills.  Appetite is good.  Request work excuse note.   Past Medical History:  Diagnosis Date   Anxiety    Arthritis    Depression    Diabetes mellitus without complication (HCC)    GERD (gastroesophageal reflux disease)    Heartburn    Hematuria, unspecified    Lumbago    Other abnormal blood chemistry    Other malaise and fatigue    Palpitations     Unspecified vitamin D deficiency    Past Surgical History:  Procedure Laterality Date   SHOULDER ARTHROSCOPY WITH ROTATOR CUFF REPAIR AND SUBACROMIAL DECOMPRESSION Right 12/22/2016   Procedure: Right shoulder arthroscopy, subacromial decompression, mini open rotator cuff repair;  Surgeon: Susa Day, MD;  Location: [facility];  Service: Orthopedics;  Laterality: Right;  120 mins    No Known Allergies  Outpatient Encounter Medications as of 12/10/2020  Medication Sig   acetaminophen (TYLENOL) 500 MG tablet Take 500 mg by mouth as needed for mild pain.    atorvastatin (LIPITOR) 20 MG tablet TAKE 1 TABLET BY MOUTH EVERY DAY   cholecalciferol (VITAMIN D3) 25 MCG (1000 UNIT) tablet Take 1,000 Units by mouth 3 (three) times a week.   diphenhydrAMINE-APAP, sleep, (GOODY PM PO) Take 1 Package by mouth at bedtime as needed (sleep).   Testosterone 30 MG/ACT SOLN APPLY ONE PUMP UNDER THE ARM ONCE DAILY   vitamin B-12 (CYANOCOBALAMIN) 1000 MCG tablet Take 1,000 mcg by mouth daily.   No facility-administered encounter medications on file as of 12/10/2020.    Review of Systems  Constitutional:  Negative for appetite change, chills, fatigue, fever and unexpected weight change.  HENT:  Negative for congestion, dental problem, ear discharge, ear pain, facial swelling, hearing loss, nosebleeds, postnasal drip, rhinorrhea, sinus pressure, sinus pain, sneezing, sore throat, tinnitus and trouble swallowing.   Eyes:  Negative for pain, discharge, redness, itching and visual disturbance.  Respiratory:  Negative for cough, chest  tightness, shortness of breath and wheezing.   Cardiovascular:  Negative for chest pain, palpitations and leg swelling.  Gastrointestinal:  Positive for abdominal pain. Negative for abdominal distention, blood in stool, constipation, diarrhea, nausea and vomiting.  Genitourinary:  Negative for difficulty urinating, dysuria, flank pain, frequency and urgency.  Musculoskeletal:  Negative  for arthralgias, back pain, gait problem, joint swelling, myalgias, neck pain and neck stiffness.  Skin:  Negative for color change, pallor and rash.  Neurological:  Negative for dizziness, speech difficulty, weakness, light-headedness and headaches.   Immunization History  Administered Date(s) Administered   Influenza,inj,Quad PF,6+ Mos 12/08/2015   Td 08/22/2007, 12/08/2015   Pertinent  Health Maintenance Due  Topic Date Due   COLONOSCOPY (Pts 45-51yr Insurance coverage will need to be confirmed)  Never done   URINE MICROALBUMIN  04/25/2018   INFLUENZA VACCINE  09/21/2020   Fall Risk  12/10/2020 10/09/2020 04/01/2020 11/11/2019 04/22/2019  Falls in the past year? 0 0 0 0 0  Comment - - - - -  Number falls in past yr: 0 0 0 0 0  Injury with Fall? 0 0 0 0 0  Risk for fall due to : No Fall Risks - - - -  Follow up Falls evaluation completed Falls evaluation completed - - -   Functional Status Survey:    There were no vitals filed for this visit. There is no height or weight on file to calculate BMI. Physical Exam Vitals reviewed.  Constitutional:      General: He is not in acute distress.    Appearance: Normal appearance. He is normal weight. He is not ill-appearing or diaphoretic.  HENT:     Head: Normocephalic.     Mouth/Throat:     Mouth: Mucous membranes are moist.     Pharynx: Oropharynx is clear. No oropharyngeal exudate or posterior oropharyngeal erythema.  Eyes:     General: No scleral icterus.       Right eye: No discharge.        Left eye: No discharge.     Conjunctiva/sclera: Conjunctivae normal.     Pupils: Pupils are equal, round, and reactive to light.  Cardiovascular:     Rate and Rhythm: Normal rate and regular rhythm.     Pulses: Normal pulses.     Heart sounds: Normal heart sounds. No murmur heard.   No friction rub. No gallop.  Pulmonary:     Effort: Pulmonary effort is normal. No respiratory distress.     Breath sounds: Normal breath sounds. No  wheezing, rhonchi or rales.  Chest:     Chest wall: No tenderness.  Abdominal:     General: Bowel sounds are normal. There is no distension.     Palpations: Abdomen is soft. There is no mass.     Tenderness: There is abdominal tenderness in the right upper quadrant and left upper quadrant. There is no right CVA tenderness, left CVA tenderness, guarding or rebound.  Musculoskeletal:        General: No swelling or tenderness. Normal range of motion.     Right lower leg: No edema.     Left lower leg: No edema.  Skin:    General: Skin is warm and dry.     Coloration: Skin is not pale.     Findings: No bruising, erythema, lesion or rash.  Neurological:     Mental Status: He is alert and oriented to person, place, and time.     Cranial Nerves: No cranial  nerve deficit.     Sensory: No sensory deficit.     Motor: No weakness.     Coordination: Coordination normal.     Gait: Gait normal.  Psychiatric:        Mood and Affect: Mood normal.        Speech: Speech normal.        Behavior: Behavior normal.        Thought Content: Thought content normal.        Judgment: Judgment normal.    Labs reviewed: Recent Labs    04/01/20 1011 10/09/20 0928  NA 138 139  K 4.5 4.3  CL 100 105  CO2 31 26  GLUCOSE 71 96  BUN 18 22  CREATININE 0.81 0.82  CALCIUM 10.0 9.7   Recent Labs    04/01/20 1011 10/09/20 0928  AST 24 23  ALT 25 25  BILITOT 0.5 0.6  PROT 7.3 6.9   Recent Labs    10/09/20 0928  WBC 8.7  NEUTROABS 6,299  HGB 16.0  HCT 48.7  MCV 82.8  PLT 275   Lab Results  Component Value Date   TSH 0.59 08/29/2017   Lab Results  Component Value Date   HGBA1C 5.6 04/01/2020   Lab Results  Component Value Date   CHOL 166 10/09/2020   HDL 45 10/09/2020   LDLCALC 98 10/09/2020   TRIG 124 10/09/2020   CHOLHDL 3.7 10/09/2020    Significant Diagnostic Results in last 30 days:  No results found.  Assessment/Plan  1. Right upper quadrant abdominal pain Started this  morning worst lying down or sitting.Tender to palpation during exam across abdomen from right -left. Did not want to go to ED would like labs done in the office first. Will order labs stat  - Advised to go to ED if pain worsen.  - Lipase - Amylase - CBC with Differential/Platelet - CMP with eGFR([facility])  2. Abdominal pain, left upper quadrant Tender to palpation.No mass palpated during exam. - Advised to go to ED if pain worsen.  Will recommend Abdominal ultrasound to evaluate for pancreatitis has had this twice in the past. - Avoid alcohol last - drunk 3-4 beers on Sunday.  - work excuse note written and given to patient may return to work on 12/14/2020.   Family/ staff Communication: Reviewed plan of care with patient verbalized understanding.   Labs/tests ordered:  - Lipase - Amylase - CBC with Differential/Platelet - CMP with eGFR([facility])  Next Appointment: As needed if symptoms worsen or fail to improve    Sandrea Hughs, NP

## 2020-12-10 NOTE — Discharge Instructions (Addendum)
Avoid alcohol.  Drink plenty of fluids.  Follow up with your Provider for recheck

## 2020-12-10 NOTE — ED Provider Notes (Signed)
Emergency Medicine Provider Triage Evaluation Note  Taylor Kidd , a 50 y.o. male  was evaluated in triage.  Pt complains of epigastric abdominal pain.  Patient reports her pain started over the last few days.  Patient went to his primary care provider earlier this morning and had lab work drawn.  Lipase was found to be elevated 1197 and amylase elevated at 363.  Patient reports previous history of pancreatitis 1 year prior.   Review of Systems  Positive: Epigastric abdominal pain Negative: Fever, chills, nausea, vomiting, diarrhea, constipation  Physical Exam  BP (!) 134/92 (BP Location: Left Arm)   Pulse 87   Temp 97.9 F (36.6 C)   Resp 18   SpO2 95%  Gen:   Awake, no distress   Resp:  Normal effort  MSK:   Moves extremities without difficulty  Other:  Protuberant abdomen, soft, tenderness to epigastric and right upper quadrant.  Umbilical hernia.  Medical Decision Making  Medically screening exam initiated at 2:52 PM.  Appropriate orders placed.  Yuvaan Olander was informed that the remainder of the evaluation will be completed by another provider, this initial triage assessment does not replace that evaluation, and the importance of remaining in the ED until their evaluation is complete.  Acute pancreatitis.  Will order CT scan to evaluate for complications.   Haskel Schroeder, PA-C 12/10/20 1453    Charlynne Pander, MD 12/10/20 [phone]

## 2020-12-10 NOTE — ED Triage Notes (Signed)
Pt presents with c/o abdominal pain that started this morning. Pt reports a hx of pancreatitis.

## 2020-12-11 ENCOUNTER — Telehealth (INDEPENDENT_AMBULATORY_CARE_PROVIDER_SITE_OTHER): Payer: BC Managed Care – PPO | Admitting: Family

## 2020-12-11 ENCOUNTER — Encounter: Payer: Self-pay | Admitting: Family

## 2020-12-11 DIAGNOSIS — K859 Acute pancreatitis without necrosis or infection, unspecified: Secondary | ICD-10-CM | POA: Diagnosis not present

## 2020-12-11 NOTE — Progress Notes (Signed)
This service is provided via telemedicine  No vital signs collected/recorded due to the encounter was a telemedicine visit.   Location of patient (ex: home, work):  Home  Patient consents to a telephone visit:  Yes  Location of the provider (ex: office, home):  [employer].  Name of any referring provider:  Sharon Seller, NP   Names of all persons participating in the telemedicine service and their role in the encounter:  Patient, Meda Klinefelter, RMA, Leonel Mccollum, Carilyn Goodpasture, NP.    Time spent on call: 8 minutes spent on the phone with Medical Assistant.     Provider: Nhi Butrum FNP-C  Sharon Seller, NP  Patient Care Team: Sharon Seller, NP as PCP - General (Geriatric Medicine) Sheran Luz, MD as Consulting Physician (Physical Medicine and Rehabilitation) Pati Gallo, MD as Consulting Physician (Sports Medicine)  Extended Emergency Contact Information Primary Emergency Contact: [facility] Address: [address]          [place], [place] [address] D[place] of [place] Home Phone: [phone] Mobile Phone: [phone] Relation: Spouse  Code Status:  Full Code  Goals of care: Advanced Directive information Advanced Directives 12/11/2020  Does Patient Have a Medical Advance Directive? No  Type of Advance Directive -  Copy of Healthcare Power of Attorney in Chart? -  Would patient like information on creating a medical advance directive? No - Patient declined     Chief Complaint  Patient presents with   Emergency Room Follow Up    ER Follow Up.    HPI:  Pt is a 50 y.o. male seen today for an acute visit for follow up ED visit 12/10/2020 for acute pancreatitis.He was here with complains of Abdominal pain stat labs were ordered Lipase was 1197,Amylase 363,WBC 11.1 he was advised to go to ED ASAP.   In the ED lipase was down to 462,WBC 12.8 CT scan showed mild acute uncomplicated pancreatitis.No fluid collection,pseudocyst or  abscess.Also stable fat containing umbilical hernia was noted.   He was treated with Hydromorphone 1 mg I.V and Ondansetron  4 mg I.V his pain improved and was discharged home on Hydrocodone - Acetaminophen 5-325 mg tablet every 6 hrs as  needed for pain.Clear liquids x 24 hrs and advance slowly was recommended.He was also advised to avoid Alcohol.  He states pain has improved today.Has not picked up his pain medication prescribed from the ED.He is more concerned that he was discharged home from ED states previously he was admitted for up to 2 days at least with clear liquids diet and advanced until condition improved.  He denies any nausea or vomiting.Has had coffee this morning.still has some abdominal pain.No fever or chills.He states does not really drink a lot of alcohol just had 3-4 beers over the weekend.   Labs, CT scan done in ED reviewed with patient.Medication reconciled.   Past Medical History:  Diagnosis Date   Anxiety    Arthritis    Depression    Diabetes mellitus without complication (HCC)    GERD (gastroesophageal reflux disease)    Heartburn    Hematuria, unspecified    Lumbago    Other abnormal blood chemistry    Other malaise and fatigue    Palpitations    Unspecified vitamin D deficiency    Past Surgical History:  Procedure Laterality Date   SHOULDER ARTHROSCOPY WITH ROTATOR CUFF REPAIR AND SUBACROMIAL DECOMPRESSION Right 12/22/2016   Procedure: Right shoulder arthroscopy, subacromial decompression, mini open rotator cuff repair;  Surgeon:  Jene Every, MD;  Location: [facility];  Service: Orthopedics;  Laterality: Right;  120 mins    No Known Allergies  Outpatient Encounter Medications as of 12/11/2020  Medication Sig   acetaminophen (TYLENOL) 500 MG tablet Take 500 mg by mouth as needed for mild pain.    atorvastatin (LIPITOR) 20 MG tablet TAKE 1 TABLET BY MOUTH EVERY DAY   cholecalciferol (VITAMIN D3) 25 MCG (1000 UNIT) tablet Take 1,000 Units by mouth 3  (three) times a week.   diphenhydrAMINE-APAP, sleep, (GOODY PM PO) Take 1 Package by mouth at bedtime as needed (sleep).   HYDROcodone-acetaminophen (NORCO/VICODIN) 5-325 MG tablet Take 2 tablets by mouth every 6 (six) hours as needed for moderate pain.   Testosterone 30 MG/ACT SOLN APPLY ONE PUMP UNDER THE ARM ONCE DAILY   vitamin B-12 (CYANOCOBALAMIN) 1000 MCG tablet Take 1,000 mcg by mouth daily.   No facility-administered encounter medications on file as of 12/11/2020.    Review of Systems  Constitutional:  Negative for appetite change, chills, fatigue, fever and unexpected weight change.  Respiratory:  Negative for cough, chest tightness, shortness of breath and wheezing.   Cardiovascular:  Negative for chest pain, palpitations and leg swelling.  Gastrointestinal:  Positive for abdominal pain. Negative for abdominal distention, blood in stool, constipation, diarrhea, nausea and vomiting.  Genitourinary:  Negative for difficulty urinating, dysuria, flank pain, frequency and urgency.  Musculoskeletal:  Negative for arthralgias, back pain, gait problem, joint swelling, myalgias, neck pain and neck stiffness.  Skin:  Negative for color change, pallor and rash.  Neurological:  Negative for dizziness, light-headedness and headaches.  Psychiatric/Behavioral:  Negative for agitation, behavioral problems, confusion and sleep disturbance. The patient is not nervous/anxious.    Immunization History  Administered Date(s) Administered   Influenza,inj,Quad PF,6+ Mos 12/08/2015   Td 08/22/2007, 12/08/2015   Pertinent  Health Maintenance Due  Topic Date Due   COLONOSCOPY (Pts 45-47yrs Insurance coverage will need to be confirmed)  Never done   URINE MICROALBUMIN  04/25/2018   INFLUENZA VACCINE  09/21/2020   Fall Risk  12/11/2020 12/10/2020 10/09/2020 04/01/2020 11/11/2019  Falls in the past year? 0 0 0 0 0  Comment - - - - -  Number falls in past yr: 0 0 0 0 0  Injury with Fall? 0 0 0 0 0  Risk  for fall due to : No Fall Risks No Fall Risks - - -  Follow up Falls evaluation completed Falls evaluation completed Falls evaluation completed - -   Functional Status Survey:    There were no vitals filed for this visit. There is no height or weight on file to calculate BMI. Physical Exam Constitutional:      General: He is not in acute distress.    Appearance: He is not ill-appearing.  HENT:     Head: Normocephalic.  Pulmonary:     Effort: Pulmonary effort is normal. No respiratory distress.  Neurological:     Mental Status: He is alert and oriented to person, place, and time.  Psychiatric:        Mood and Affect: Mood normal.        Behavior: Behavior normal.        Thought Content: Thought content normal.        Judgment: Judgment normal.    Labs reviewed: Recent Labs    10/09/20 0928 12/10/20 1028 12/10/20 1502  NA 139 137 136  K 4.3 3.9 4.1  CL 105 102 103  CO2 26  28 26  GLUCOSE 96 126 87  BUN 22 22 22*  CREATININE 0.82 0.91 0.93  CALCIUM 9.7 9.7 9.5   Recent Labs    10/09/20 0928 12/10/20 1028 12/10/20 1502  AST 23 17 22   ALT 25 21 24   ALKPHOS  --   --  95  BILITOT 0.6 0.7 0.7  PROT 6.9 6.8 7.6  ALBUMIN  --   --  4.2   Recent Labs    10/09/20 0928 12/10/20 1028 12/10/20 1502  WBC 8.7 11.1* 12.8*  NEUTROABS 6,299 8,902* 9.4*  HGB 16.0 15.8 15.9  HCT 48.7 46.5 47.1  MCV 82.8 82.4 82.8  PLT 275 251 260   Lab Results  Component Value Date   TSH 0.59 08/29/2017   Lab Results  Component Value Date   HGBA1C 5.6 04/01/2020   Lab Results  Component Value Date   CHOL 166 10/09/2020   HDL 45 10/09/2020   LDLCALC 98 10/09/2020   TRIG 124 10/09/2020   CHOLHDL 3.7 10/09/2020    Significant Diagnostic Results in last 30 days:  CT ABDOMEN PELVIS W CONTRAST  Result Date: 12/10/2020 CLINICAL DATA:  Epigastric pain, history of pancreatitis EXAM: CT ABDOMEN AND PELVIS WITH CONTRAST TECHNIQUE: Multidetector CT imaging of the abdomen and pelvis  was performed using the standard protocol following bolus administration of intravenous contrast. CONTRAST:  43mL OMNIPAQUE IOHEXOL 350 MG/ML SOLN COMPARISON:  11/02/2019 FINDINGS: Lower chest: No acute pleural or parenchymal lung disease. Hepatobiliary: No focal liver abnormality is seen. No gallstones, gallbladder wall thickening, or biliary dilatation. Pancreas: Mild peripancreatic fat stranding consistent with acute uncomplicated pancreatitis. Pancreatic parenchyma enhances normally. No fluid collection, pseudocyst, or abscess. Spleen: Normal in size without focal abnormality. Adrenals/Urinary Tract: Adrenal glands are unremarkable. Kidneys are normal, without renal calculi, focal lesion, or hydronephrosis. Bladder is unremarkable. Stomach/Bowel: No bowel obstruction or ileus. Normal appendix right lower quadrant. No bowel wall thickening or inflammatory change. Vascular/Lymphatic: No significant vascular findings are present. No enlarged abdominal or pelvic lymph nodes. Reproductive: Prostate is unremarkable. Other: No free fluid or free gas. There is a small fat containing umbilical hernia. Musculoskeletal: No acute or destructive bony lesions. Reconstructed images demonstrate no additional findings. IMPRESSION: 1. Mild acute uncomplicated pancreatitis. No fluid collection, pseudocyst, or abscess. 2. Stable fat containing umbilical hernia. Electronically Signed   By: 89m M.D.   On: 12/10/2020 19:03    Assessment/Plan   Acute pancreatitis, unspecified complication status, unspecified pancreatitis type Reports pain has improved and without any nausea or vomiting. Has not picked up Norco yet from the pharmacy. Advised to continue on clear liquids diet and advance as tolerated. - Also advised to avoid alcohol and seeking AAA for help.  - Advised to go to ED if symptoms worsen.  - Ambulatory referral to Gastroenterology   Family/ staff Communication: Reviewed plan of care with patient  verbalized understanding.   Labs/tests ordered: None   Next Appointment: As needed if symptoms worsen or fail to improve  I connected with  Sharlet Salina on 12/11/20 by a video enabled telemedicine application and verified that I am speaking with the correct person using two identifiers.   I discussed the limitations of evaluation and management by telemedicine. The patient expressed understanding and agreed to proceed.  Spent 12 minutes of face to face with patient  >50% time spent counseling; reviewing medical record; tests; labs; and developing future plan of care  [name], NP

## 2021-01-27 ENCOUNTER — Encounter: Payer: Self-pay | Admitting: Gastroenterology

## 2021-01-28 ENCOUNTER — Other Ambulatory Visit: Payer: Self-pay | Admitting: Nurse Practitioner

## 2021-01-28 DIAGNOSIS — R7989 Other specified abnormal findings of blood chemistry: Secondary | ICD-10-CM

## 2021-01-28 NOTE — Telephone Encounter (Signed)
Patient has request refill on medication "Testosterone". Patient last refill on medication was 12/09/2020 with 22ml. Patient has Non Opioid contract on file dated 04/07/2020 for "Tramadol" but not Testosterone. Patient has upcoming appointment 04/16/2021. Update contract for Testosterone added to patient appointment notes. Medication pend and sent to PCP Sharon Seller, NP.

## 2021-02-17 ENCOUNTER — Ambulatory Visit (INDEPENDENT_AMBULATORY_CARE_PROVIDER_SITE_OTHER): Payer: BC Managed Care – PPO | Admitting: Gastroenterology

## 2021-02-17 ENCOUNTER — Other Ambulatory Visit (INDEPENDENT_AMBULATORY_CARE_PROVIDER_SITE_OTHER): Payer: BC Managed Care – PPO

## 2021-02-17 ENCOUNTER — Encounter: Payer: Self-pay | Admitting: Gastroenterology

## 2021-02-17 VITALS — BP 126/80 | HR 94 | Ht 75.0 in | Wt 263.4 lb

## 2021-02-17 DIAGNOSIS — R1013 Epigastric pain: Secondary | ICD-10-CM | POA: Diagnosis not present

## 2021-02-17 DIAGNOSIS — K859 Acute pancreatitis without necrosis or infection, unspecified: Secondary | ICD-10-CM

## 2021-02-17 DIAGNOSIS — Z1211 Encounter for screening for malignant neoplasm of colon: Secondary | ICD-10-CM

## 2021-02-17 DIAGNOSIS — K219 Gastro-esophageal reflux disease without esophagitis: Secondary | ICD-10-CM | POA: Diagnosis not present

## 2021-02-17 LAB — GAMMA GT: GGT: 33 U/L (ref 7–51)

## 2021-02-17 LAB — LIPASE: Lipase: 12 U/L (ref 11.0–59.0)

## 2021-02-17 MED ORDER — SUTAB 1479-225-188 MG PO TABS: 1.0000 | ORAL_TABLET | Freq: Once | ORAL | 0 refills | Status: AC

## 2021-02-17 NOTE — Addendum Note (Signed)
Addended by: Jeanine Luz on: 02/17/2021 11:22 AM   Modules accepted: Orders

## 2021-02-17 NOTE — Patient Instructions (Signed)
If you are age 50 or older, your body mass index should be between 23-30. Your Body mass index is 32.92 kg/m. If this is out of the aforementioned range listed, please consider follow up with your Primary Care Provider.  If you are age 2 or younger, your body mass index should be between 19-25. Your Body mass index is 32.92 kg/m. If this is out of the aformentioned range listed, please consider follow up with your Primary Care Provider.   ________________________________________________________  The [facility] providers would like to encourage you to use [facility] to communicate with providers for non-urgent requests or questions.  Due to long hold times on the telephone, sending your provider a message by [facility] may be a faster and more efficient way to get a response.  Please allow 48 business hours for a response.  Please remember that this is for non-urgent requests.  _______________________________________________________  Your provider has requested that you go to the basement level for lab work before leaving today. Press "B" on the elevator. The lab is located at the first door on the left as you exit the elevator.  You have been scheduled for an endoscopy and colonoscopy. Please follow the written instructions given to you at your visit today. Please pick up your prep supplies at the pharmacy within the next 1-3 days. If you use inhalers (even only as needed), please bring them with you on the day of your procedure.

## 2021-02-17 NOTE — Progress Notes (Signed)
HPI : Taylor Kidd is a very pleasant 50 year old male referred to [place] by Sherrie Mustache, NP for further evaluation of recurrent acute pancreatitis.  Patient had his first episode of pancreatitis in December 2020.  He was hospitalized for almost a week because of p.o. intolerance and persistent pain.  He had a second recurrence in September 2021, which was not as bad and he was hospitalized for 2 or 3 days.  He had his third occurrence in October of this year which was much less severe and he did not require hospitalization, treated with pain medicine and diet modification.  The patient describes fairly typical pancreatitis symptoms when he has these episodes, characterized by constant unrelenting epigastric pain radiating into the back associated with nausea/vomiting and anorexia.   Evaluation for his pancreatitis thus far has consisted of an ultrasound and MRCP which are unrevealing for cholelithiasis, sludge or anatomic anomalies.  He used to take metformin, but this was stopped over a year ago.  He reports infrequent alcohol use, on average 3-4 beers a week, usually when he plays golf.  He does not drink hard liquor.  He has never been a heavy or a daily drinker.  He denies any of his pancreatitis episodes being precipitated by alcohol use.  He denies any known family history of pancreatitis or pancreatic cancer.  He currently takes Lipitor, topical testosterone, vitamin D and takes NSAIDs as needed for back pain.  He previously frequently took narcotics for his back pain, but has not taken any for several years. For the past several years, he has had more subtle chronic GI symptoms characterized by vague abdominal discomfort, cramping and gas/bloat symptoms.  He reports regular bowel movements, at least daily, but does report stools are frequently loose and sometimes oily. He also has chronic heartburn.  He has had this for several years and has symptoms most days of the week.  He takes Tums as needed  which he says does help.  No dysphagia.  No weight loss.  No nausea vomiting.  He had a significant episode of back pain this weekend.  He started having back pain last week, and it worsened to the point where it was severe over the weekend, and he was barely able to walk.  His wife noticed bruising in his left lower back.  He has been treating with heat pads and Tylenol or ibuprofen, and it seems to be improving.  He denied any symptoms of nausea or vomiting, anorexia or any urinary symptoms.  His appetite has been good.  Pain is only worsened with movement and certain positions.  He does not recall exactly when the pain started or if there is a precipitating event.  He was concerned this may be related to kidney stones.     He has never had a colonoscopy.  He has no family history of colon cancer.  He denies history of blood in the stool.   Past Medical History:  Diagnosis Date   Anxiety    Arthritis    Depression    Diabetes mellitus without complication (HCC)    GERD (gastroesophageal reflux disease)    Heartburn    Hematuria, unspecified    Lumbago    Other abnormal blood chemistry    Other malaise and fatigue    Palpitations    Unspecified vitamin D deficiency      Past Surgical History:  Procedure Laterality Date   SHOULDER ARTHROSCOPY WITH ROTATOR CUFF REPAIR AND SUBACROMIAL DECOMPRESSION Right 12/22/2016  Procedure: Right shoulder arthroscopy, subacromial decompression, mini open rotator cuff repair;  Surgeon: Susa Day, MD;  Location: [facility];  Service: Orthopedics;  Laterality: Right;  120 mins   History reviewed. No pertinent family history. Social History   Tobacco Use   Smoking status: Former    Packs/day: 0.50    Years: 30.00    Pack years: 15.00    Types: Cigarettes    Quit date: 03/23/2017    Years since quitting: 3.9   Smokeless tobacco: Never  Vaping Use   Vaping Use: Former  Substance Use Topics   Alcohol use: Yes    Alcohol/week: 0.0 standard  drinks    Comment: rarely    Drug use: No   Current Outpatient Medications  Medication Sig Dispense Refill   acetaminophen (TYLENOL) 500 MG tablet Take 500 mg by mouth as needed for mild pain.      atorvastatin (LIPITOR) 20 MG tablet TAKE 1 TABLET BY MOUTH EVERY DAY 90 tablet 1   cholecalciferol (VITAMIN D3) 25 MCG (1000 UNIT) tablet Take 1,000 Units by mouth 3 (three) times a week.     diphenhydrAMINE-APAP, sleep, (GOODY PM PO) Take 1 Package by mouth at bedtime as needed (sleep).     Testosterone 30 MG/ACT SOLN APPLY ONE PUMP UNDER THE ARM ONCE DAILY 90 mL 0   vitamin B-12 (CYANOCOBALAMIN) 1000 MCG tablet Take 1,000 mcg by mouth daily.     No current facility-administered medications for this visit.   No Known Allergies   Review of Systems: All systems reviewed and negative except where noted in HPI.    No results found.  Physical Exam: Ht 6\' 3"  (1.905 m)    Wt 263 lb 6 oz (119.5 kg)    BMI 32.92 kg/m  Constitutional: Pleasant,well-developed, Caucasian male in no acute distress. HEENT: Normocephalic and atraumatic. Conjunctivae are normal. No scleral icterus. Cardiovascular: Normal rate, regular rhythm.  Pulmonary/chest: Effort normal and breath sounds normal. No wheezing, rales or rhonchi. Abdominal: Soft, nondistended, mild tenderness to palpation in the epigastrium, left upper quadrant, left lower quadrant.  Reducible umbilical hernia present.  Bowel sounds active throughout. There are no masses palpable. No hepatomegaly.  No costovertebral angle tenderness.  Small localized area of bruising in the left lower back Extremities: no edema. Neurological: Alert and oriented to person place and time. Skin: Skin is warm and dry. No rashes noted. Psychiatric: Normal mood and affect. Behavior is normal.  CBC    Component Value Date/Time   WBC 12.8 (H) 12/10/2020 1502   RBC 5.69 12/10/2020 1502   HGB 15.9 12/10/2020 1502   HCT 47.1 12/10/2020 1502   PLT 260 12/10/2020 1502    MCV 82.8 12/10/2020 1502   MCH 27.9 12/10/2020 1502   MCHC 33.8 12/10/2020 1502   RDW 12.5 12/10/2020 1502   RDW 13.6 06/21/2013 1040   LYMPHSABS 2.4 12/10/2020 1502   LYMPHSABS 2.5 06/21/2013 1040   MONOABS 0.8 12/10/2020 1502   EOSABS 0.2 12/10/2020 1502   EOSABS 0.1 06/21/2013 1040   BASOSABS 0.1 12/10/2020 1502   BASOSABS 0.0 06/21/2013 1040    CMP     Component Value Date/Time   NA 136 12/10/2020 1502   NA 140 06/01/2015 0809   K 4.1 12/10/2020 1502   CL 103 12/10/2020 1502   CO2 26 12/10/2020 1502   GLUCOSE 87 12/10/2020 1502   BUN 22 (H) 12/10/2020 1502   BUN 18 06/01/2015 0809   CREATININE 0.93 12/10/2020 1502   CREATININE 0.91  12/10/2020 1028   CALCIUM 9.5 12/10/2020 1502   PROT 7.6 12/10/2020 1502   PROT 6.9 10/14/2014 0940   ALBUMIN 4.2 12/10/2020 1502   ALBUMIN 4.3 10/14/2014 0940   AST 22 12/10/2020 1502   ALT 24 12/10/2020 1502   ALKPHOS 95 12/10/2020 1502   BILITOT 0.7 12/10/2020 1502   BILITOT 0.3 10/14/2014 0940   GFRNONAA >60 12/10/2020 1502   GFRNONAA 104 04/01/2020 1011   GFRAA 121 04/01/2020 1011   Component Ref Range & Units 2 mo ago  (12/10/20) 2 mo ago  (12/10/20) 1 yr ago  (11/05/19) 1 yr ago  (11/02/19) 1 yr ago  (02/26/19) 2 yr ago  (02/18/19) 2 yr ago  (02/15/19)  Lipase 11 - 51 U/L 462 High   1,197 High  R, CM  22 CM  144 High  CM  23 R  20 CM  955 High     CLINICAL DATA:  One day of epigastric pain with nausea vomiting, leukocytosis   EXAM: CT ABDOMEN AND PELVIS WITH CONTRAST   TECHNIQUE: Multidetector CT imaging of the abdomen and pelvis was performed using the standard protocol following bolus administration of intravenous contrast.   CONTRAST:  OMNIPAQUE IOHEXOL 300 MG/ML  SOLN   COMPARISON:  CT abdomen pelvis 08/30/2006   FINDINGS: Lower chest: There are dependent atelectatic changes in the lung bases. No consolidation or visible effusion. Normal heart size. No pericardial effusion.   Hepatobiliary: No focal  liver abnormality is seen. No gallstones, gallbladder wall thickening, or biliary dilatation.   Pancreas: Diffusely edematous appearance of the partially fatty replaced pancreas. There is some reactive peripancreatic fluid stranding predominantly along the body and tail of the pancreas. Parenchyma enhances uniformly accounting for more focal fatty infiltration at the pancreatic head seen on comparison study.   Spleen: Normal in size without focal abnormality.   Adrenals/Urinary Tract: Adrenal glands are unremarkable. Kidneys are normal, without renal calculi, focal lesion, or hydronephrosis. Bladder is unremarkable.   Stomach/Bowel: Distal esophagus and stomach are unremarkable. Mild periduodenal stranding is likely secondary to pancreatic inflammation in the absence of frank duodenal wall thickening. No small bowel dilatation or wall thickening. A normal appendix is visualized. No colonic dilatation or wall thickening.   Vascular/Lymphatic: The aorta is normal caliber. Upper abdominal reactive adenopathy is seen. No suspicious lymph nodes in the included lymphatic chains.   Reproductive: The prostate and seminal vesicles are unremarkable.   Other: Edematous change and small volume of peripancreatic fluid about the pancreatic tail without organized collection. No other free fluid or free for air in the abdomen or pelvis. Small fat containing umbilical hernia. No bowel containing hernia.   Musculoskeletal: Multilevel degenerative changes are present in the imaged portions of the spine. No acute osseous abnormality or suspicious osseous lesion.   IMPRESSION: 1. Findings most compatible with acute interstitial edematous pancreatitis in the setting of elevated lipase. Fairly uniform enhancement of the pancreatic parenchyma accounting for focal fatty infiltration of the pancreatic head seen on comparison study. Small amount of peripancreatic free fluid towards the pancreatic body  and tail without organized collection. 2. Milder paraduodenal stranding is likely secondary to pancreatic inflammation in the absence of frank duodenal wall thickening   These results were called by telephone at the time of interpretation on 02/15/2019 at 1:26 am to provider Roxy Horseman , who verbally acknowledged these results.     Electronically Signed   By: Kreg Shropshire M.D.   On: 02/15/2019 01:26  CLINICAL DATA:  Supraumbilical abdominal pain for 2-3 days, suspected pancreatitis, history of pancreatitis   EXAM: CT ABDOMEN AND PELVIS WITH CONTRAST   TECHNIQUE: Multidetector CT imaging of the abdomen and pelvis was performed using the standard protocol following bolus administration of intravenous contrast. Sagittal and coronal MPR images reconstructed from axial data set.   CONTRAST:  100mL OMNIPAQUE IOHEXOL 300 MG/ML SOLN IV. No oral contrast.   COMPARISON:  02/15/2019   FINDINGS: Lower chest: Minimal bibasilar atelectasis   Hepatobiliary: Gallbladder and liver normal appearance   Pancreas: Enlargement and surrounding edema at the pancreatic head/body consistent with acute pancreatitis. Less edema along pancreatic tail. No pancreatic mass, ductal dilatation, calcification or abnormal fluid collection.   Spleen: Normal appearance   Adrenals/Urinary Tract: No significant abnormalities of the adrenal glands, kidneys, ureters, or bladder   Stomach/Bowel: Normal appendix. Mild wall thickening of the descending duodenum with slight distension of the distal second and third portions likely related to adjacent pancreatitis. No evidence of obstruction. Stomach and bowel loops otherwise unremarkable.   Vascular/Lymphatic: Aorta normal caliber. Vascular structures patent, including portal vein, SMV, and splenic vein. Few normal sized peripancreatic nodes. No abdominal or pelvic adenopathy.   Reproductive: Unremarkable prostate gland and seminal vesicles   Other:  Umbilical hernia containing fat.  No free air or free fluid.   Musculoskeletal: Degenerative disc disease changes L5-S1 with endplate spurring and bulging disc.   IMPRESSION: Acute pancreatitis involving the pancreatic head/body with edema of adjacent duodenum.   No acute complications of pancreatitis identified.   Umbilical hernia containing fat.   No other intra-abdominal or intrapelvic abnormalities.     Electronically Signed   By: Ulyses SouthwardMark  Boles M.D.   On: 11/02/2019 12:47    CLINICAL DATA:  Abdominal pain with evidence of pancreatitis on recent CT   EXAM: ULTRASOUND ABDOMEN LIMITED RIGHT UPPER QUADRANT   COMPARISON:  CT abdomen and pelvis November 02, 2019   FINDINGS: Gallbladder:   No gallstones or wall thickening visualized. There is no pericholecystic fluid. No sonographic Murphy sign noted by sonographer.   Common bile duct:   Diameter: 3 mm. No intrahepatic or extrahepatic biliary duct dilatation.   Liver:   No focal lesion identified. Within normal limits in parenchymal echogenicity. Portal vein is patent on color Doppler imaging with normal direction of blood flow towards the liver.   Other: None.   IMPRESSION: Study within normal limits.     Electronically Signed   By: Bretta BangWilliam  Woodruff III M.D.   On: 11/02/2019 16:19  CLINICAL DATA:  Inpatient. Recurrent acute pancreatitis of uncertain etiology. Abdominal pain.   EXAM: MRI ABDOMEN WITHOUT AND WITH CONTRAST (INCLUDING MRCP)   TECHNIQUE: Multiplanar multisequence MR imaging of the abdomen was performed both before and after the administration of intravenous contrast. Heavily T2-weighted images of the biliary and pancreatic ducts were obtained, and three-dimensional MRCP images were rendered by post processing.   CONTRAST:  10mL GADAVIST GADOBUTROL 1 MMOL/ML IV SOLN   COMPARISON:  11/02/2019 CT abdomen/pelvis and abdominal sonogram.   FINDINGS: Lower chest: Mild dependent bibasilar  atelectasis.   Hepatobiliary: Normal liver size and configuration. No hepatic steatosis. No liver mass. Normal gallbladder with no cholelithiasis. No biliary ductal dilatation. Common bile duct diameter 2 mm. No choledocholithiasis. No biliary masses, strictures or beading.   Pancreas: There is thickening and mild parenchymal edema in the pancreatic head and neck with mild peripancreatic edema compatible with acute pancreatitis. Preserved pancreatic parenchymal enhancement. No discrete pancreatic mass or pancreatic duct dilation.  No definite evidence of pancreas divisum.   Spleen: Normal size. No mass.   Adrenals/Urinary Tract: Normal adrenals. No hydronephrosis. Subcentimeter renal cortical cysts in the upper right and lower left kidneys. No suspicious renal masses.   Stomach/Bowel: Normal non-distended stomach. Visualized small and large bowel is normal caliber, with no bowel wall thickening.   Vascular/Lymphatic: Normal caliber abdominal aorta. Patent portal, splenic, hepatic and renal veins. Mildly enlarged 1.2 cm peripancreatic node near the porta hepatis (series 29/image 52). No additional pathologically enlarged abdominal lymph nodes.   Other: No abdominal ascites or focal fluid collection.   Musculoskeletal: No aggressive appearing focal osseous lesions.   IMPRESSION: 1. Acute non-necrotizing pancreatitis involving the pancreatic head and neck. No discrete pancreatic mass or pancreatic duct dilation. 2. No cholelithiasis or choledocholithiasis. No biliary ductal dilatation. 3. Mildly enlarged peripancreatic node near the porta hepatis, nonspecific, probably reactive.     Electronically Signed   By: Ilona Sorrel M.D.   On: 11/04/2019 14:14  CLINICAL DATA:  Epigastric pain, history of pancreatitis   EXAM: CT ABDOMEN AND PELVIS WITH CONTRAST   TECHNIQUE: Multidetector CT imaging of the abdomen and pelvis was performed using the standard protocol following bolus  administration of intravenous contrast.   CONTRAST:  4mL OMNIPAQUE IOHEXOL 350 MG/ML SOLN   COMPARISON:  11/02/2019   FINDINGS: Lower chest: No acute pleural or parenchymal lung disease.   Hepatobiliary: No focal liver abnormality is seen. No gallstones, gallbladder wall thickening, or biliary dilatation.   Pancreas: Mild peripancreatic fat stranding consistent with acute uncomplicated pancreatitis. Pancreatic parenchyma enhances normally. No fluid collection, pseudocyst, or abscess.   Spleen: Normal in size without focal abnormality.   Adrenals/Urinary Tract: Adrenal glands are unremarkable. Kidneys are normal, without renal calculi, focal lesion, or hydronephrosis. Bladder is unremarkable.   Stomach/Bowel: No bowel obstruction or ileus. Normal appendix right lower quadrant. No bowel wall thickening or inflammatory change.   Vascular/Lymphatic: No significant vascular findings are present. No enlarged abdominal or pelvic lymph nodes.   Reproductive: Prostate is unremarkable.   Other: No free fluid or free gas. There is a small fat containing umbilical hernia.   Musculoskeletal: No acute or destructive bony lesions. Reconstructed images demonstrate no additional findings.   IMPRESSION: 1. Mild acute uncomplicated pancreatitis. No fluid collection, pseudocyst, or abscess. 2. Stable fat containing umbilical hernia.     Electronically Signed   By: Randa Ngo M.D.   On: 12/10/2020 19:03   ASSESSMENT AND PLAN: 50 year old male with 3 episodes of acute pancreatitis over the past 2 years without any reliable etiology identified.  Alcohol seems very unlikely based on his reported alcohol use.  No evidence of gallstones or sludge seen on ultrasound or MRI.  No pancreatic divisum seen on MRI.  Not taking any medications associated with pancreatitis.  No family history of pancreatitis.  Given his multiple episodes without a clear etiology, we will rule out rare genetic  causes of pancreatitis with PRS S1 and SPINK testing.  We will check fecal elastase given his reports of frequent loose and oily stools and chronic abdominal discomfort.  An EUS may be helpful to further evaluate for microlithiasis. Will perform an upper endoscopy given his chronic symptoms of heartburn and dyspepsia and frequent NSAID use.  We will check an H. pylori stool antigen and TTG/IgA as well.  He is overdue for his initial average risk colon cancer screening.  We will schedule him for a routine screening colonoscopy at the time of his upper endoscopy.  Recurrent acute pancreatitis - fecal elastase - SPINK, PRSS1 - Will consider EUS following EGD and colonoscopy  Dyspepsia  - EGD - H. Pylori stool antigen - TTG/IgA  GERD - EGD to assess for complications of GERD - Continue Tums for now, will start PPI after EGD  CRC screening - Colonoscopy  The details, risks (including bleeding, perforation, infection, missed lesions, medication reactions and possible hospitalization or surgery if complications occur), benefits, and alternatives to EGD/colonoscopy with possible biopsy and possible polypectomy were discussed with the patient and he consents to proceed.   Amal Renbarger E. Candis Schatz, MD [facility]   CC: Lauree Chandler, NP

## 2021-02-18 LAB — IGA: Immunoglobulin A: 240 mg/dL (ref 47–310)

## 2021-02-18 LAB — TISSUE TRANSGLUTAMINASE, IGA: (tTG) Ab, IgA: <1 U/mL

## 2021-03-09 ENCOUNTER — Telehealth: Payer: Self-pay | Admitting: Gastroenterology

## 2021-03-09 ENCOUNTER — Telehealth: Payer: Self-pay

## 2021-03-09 NOTE — Telephone Encounter (Signed)
Called patient to reschedule procedure with Dr.Cunningham because patient had been scheduled with Dr.Perry by [facility] while I was out on [facility] 02/17/21.  Left VM for patient to call back.

## 2021-03-09 NOTE — Telephone Encounter (Signed)
Vivien Rota from [facility] called wanting to clarify testing orders for patient. Please advise.  [phone]  Opt 1 Then opt 3

## 2021-03-09 NOTE — Telephone Encounter (Signed)
Orders were put in 02/17/21 while I was out on [facility] by Alonna Buckler. [facility] called to confirm lab orders for PRSS1 and SPINKS. Per Dr.Cunningham he only wanted PRSS1 and SPINKS and not CTRF panel. Called [facility] and confirmed message.

## 2021-03-09 NOTE — Telephone Encounter (Signed)
Looks like you may have entered these orders.

## 2021-03-22 LAB — PANCREATITIS: 3-GENE PANEL

## 2021-04-14 NOTE — Progress Notes (Signed)
Taylor Kidd,  The tests for genetic causes of pancreatitis were negative (normal).  Will follow up with you after your EGD/colonoscopy

## 2021-04-15 ENCOUNTER — Encounter: Payer: BC Managed Care – PPO | Admitting: Internal Medicine

## 2021-04-15 ENCOUNTER — Telehealth: Payer: Self-pay

## 2021-04-15 NOTE — Telephone Encounter (Signed)
Incoming call received from patient complaining of a single episode of tunnel vision, dizzy, faint feeling, shortness of breath, and heart fluttering x 5-10 seconds.   Patient had recently ate some pizza and drank sweet tea.   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 I advised patient that he should seek medical attention at urgent care or the emergency room to rule out a serious heart condition.   Patient states he plans to be seen tomorrow (appointment scheduled).   Patient aware I will send message to Sharon Seller, NP as a FYI and call back with any additional recommendations.

## 2021-04-15 NOTE — Telephone Encounter (Signed)
Noted, yes if episode happens again would seek emergency medical attention

## 2021-04-16 ENCOUNTER — Ambulatory Visit (INDEPENDENT_AMBULATORY_CARE_PROVIDER_SITE_OTHER): Payer: BC Managed Care – PPO | Admitting: Nurse Practitioner

## 2021-04-16 ENCOUNTER — Ambulatory Visit: Payer: BC Managed Care – PPO | Admitting: Nurse Practitioner

## 2021-04-16 ENCOUNTER — Other Ambulatory Visit: Payer: Self-pay

## 2021-04-16 ENCOUNTER — Encounter: Payer: Self-pay | Admitting: Nurse Practitioner

## 2021-04-16 VITALS — BP 122/80 | HR 81 | Temp 97.9°F | Ht 75.0 in | Wt 262.0 lb

## 2021-04-16 DIAGNOSIS — H938X2 Other specified disorders of left ear: Secondary | ICD-10-CM

## 2021-04-16 DIAGNOSIS — E6609 Other obesity due to excess calories: Secondary | ICD-10-CM | POA: Diagnosis not present

## 2021-04-16 DIAGNOSIS — E782 Mixed hyperlipidemia: Secondary | ICD-10-CM | POA: Diagnosis not present

## 2021-04-16 DIAGNOSIS — K2100 Gastro-esophageal reflux disease with esophagitis, without bleeding: Secondary | ICD-10-CM | POA: Diagnosis not present

## 2021-04-16 DIAGNOSIS — I471 Supraventricular tachycardia: Secondary | ICD-10-CM

## 2021-04-16 DIAGNOSIS — R079 Chest pain, unspecified: Secondary | ICD-10-CM

## 2021-04-16 DIAGNOSIS — Z6832 Body mass index (BMI) 32.0-32.9, adult: Secondary | ICD-10-CM

## 2021-04-16 NOTE — Progress Notes (Signed)
Careteam: Patient Care Team: Sharon Seller, NP as PCP - General (Geriatric Medicine) Sheran Luz, MD as Consulting Physician (Physical Medicine and Rehabilitation) Pati Gallo, MD as Consulting Physician (Sports Medicine)  PLACE OF SERVICE:  [facility]  Advanced Directive information Does Patient Have a Medical Advance Directive?: No, Would patient like information on creating a medical advance directive?: Yes (MAU/Ambulatory/Procedural Areas - Information given) (given at previous visit)  No Known Allergies  Chief Complaint  Patient presents with   Medical Management of Chronic Issues    6 month follow-up. Discuss need for coivd boosters, colonoscopy, malb, shingrix and flu vaccine or post pone if patient refuses. Patient denies receiving any vaccines since last visit.     Acute Visit    Episode of light headed, dizziness, sob, heart fluttering, and overall felt weird for 5 sec.      HPI: Patient is a 51 y.o. Kidd for routine follow up.  Reports yesterday he was sitting down in the car after he got home.  Reports he had tunnel vision, everything was blurry, felt like he was going to faint then he jump up and heart rate jumped up but then he felt better. Lasted about 5 seconds and then was better.  No chest pains.   Reported he did have some chest discomfort while sitting a few weeks ago.  Reports the chest pain lasted about an hour.  Report he laid down and made pain worse. He sat up and it improved  No shortness of breath, palpitations during this event.   Reports breathing has been different since he had COVID. ~2 years ago.  Nonsmoker.   Hx of pancreatitis- he does not drink ETOH, attempts low fat diet.   Low back pain- alternating tylenol and ibuprofen   Colonoscopy schedule in April  Does not wish to have COVID vaccine.   Review of Systems:  Review of Systems  Constitutional:  Negative for chills, fever and weight loss.  HENT:  Negative for  tinnitus.   Respiratory:  Negative for cough, sputum production and shortness of breath.   Cardiovascular:  Negative for chest pain, palpitations and leg swelling.  Gastrointestinal:  Negative for abdominal pain, constipation, diarrhea and heartburn.  Genitourinary:  Negative for dysuria, frequency and urgency.  Musculoskeletal:  Negative for back pain, falls, joint pain and myalgias.  Skin: Negative.   Neurological:  Negative for dizziness and headaches.  Psychiatric/Behavioral:  Negative for depression and memory loss. The patient does not have insomnia.    Past Medical History:  Diagnosis Date   Anxiety    Arthritis    Depression    Diabetes mellitus without complication (HCC)    GERD (gastroesophageal reflux disease)    Heartburn    Hematuria, unspecified    Lumbago    Other abnormal blood chemistry    Other malaise and fatigue    Palpitations    Unspecified vitamin D deficiency    Past Surgical History:  Procedure Laterality Date   SHOULDER ARTHROSCOPY WITH ROTATOR CUFF REPAIR AND SUBACROMIAL DECOMPRESSION Right 12/22/2016   Procedure: Right shoulder arthroscopy, subacromial decompression, mini open rotator cuff repair;  Surgeon: Jene Every, MD;  Location: [facility];  Service: Orthopedics;  Laterality: Right;  120 mins   Social History:   reports that he quit smoking about 4 years ago. His smoking use included cigarettes. He has a 15.00 pack-year smoking history. He has never used smokeless tobacco. He reports current alcohol use. He reports that he does not  use drugs.  History reviewed. No pertinent family history.  Medications: Patient's Medications  New Prescriptions   No medications on file  Previous Medications   ACETAMINOPHEN (TYLENOL) 500 MG TABLET    Take 500 mg by mouth as needed for mild pain.    ATORVASTATIN (LIPITOR) 20 MG TABLET    TAKE 1 TABLET BY MOUTH EVERY DAY   CHOLECALCIFEROL (VITAMIN D3) 25 MCG (1000 UNIT) TABLET    Take 1,000 Units by mouth 3  (three) times a week.   DIPHENHYDRAMINE-APAP, SLEEP, (GOODY PM PO)    Take 1 Package by mouth at bedtime as needed (sleep).   TESTOSTERONE 30 MG/ACT SOLN    APPLY ONE PUMP UNDER THE ARM ONCE DAILY   VITAMIN B-12 (CYANOCOBALAMIN) 1000 MCG TABLET    Take 1,000 mcg by mouth daily.  Modified Medications   No medications on file  Discontinued Medications   No medications on file    Physical Exam:  Vitals:   04/16/21 0830  BP: 122/80  Pulse: 81  Temp: 97.9 F (36.6 C)  TempSrc: Temporal  SpO2: 97%  Weight: 262 lb (118.8 kg)  Height: 6\' 3"  (1.905 m)   Body mass index is 32.75 kg/m. Wt Readings from Last 3 Encounters:  04/16/21 262 lb (118.8 kg)  02/17/21 263 lb 6 oz (119.5 kg)  12/10/20 255 lb 8.2 oz (115.9 kg)    Physical Exam Constitutional:      General: He is not in acute distress.    Appearance: He is well-developed. He is not diaphoretic.  HENT:     Head: Normocephalic and atraumatic.     Right Ear: External ear normal.     Left Ear: External ear normal.     Mouth/Throat:     Pharynx: No oropharyngeal exudate.  Eyes:     Conjunctiva/sclera: Conjunctivae normal.     Pupils: Pupils are equal, round, and reactive to light.  Cardiovascular:     Rate and Rhythm: Normal rate and regular rhythm.     Heart sounds: Normal heart sounds.  Pulmonary:     Effort: Pulmonary effort is normal.     Breath sounds: Normal breath sounds.  Abdominal:     General: Bowel sounds are normal.     Palpations: Abdomen is soft.  Musculoskeletal:        General: No tenderness.     Cervical back: Normal range of motion and neck supple.     Right lower leg: No edema.     Left lower leg: No edema.  Skin:    General: Skin is warm and dry.  Neurological:     Mental Status: He is alert and oriented to person, place, and time.    Labs reviewed: Basic Metabolic Panel: Recent Labs    10/09/20 0928 12/10/20 1028 12/10/20 1502  NA 139 137 136  K 4.3 3.9 4.1  CL 105 102 103  CO2 26 28  26   GLUCOSE 96 126 87  BUN 22 22 22*  CREATININE 0.82 0.91 0.93  CALCIUM 9.7 9.7 9.5   Liver Function Tests: Recent Labs    10/09/20 0928 12/10/20 1028 12/10/20 1502  AST 23 17 22   ALT 25 21 24   ALKPHOS  --   --  95  BILITOT 0.6 0.7 0.7  PROT 6.9 6.8 7.6  ALBUMIN  --   --  4.2   Recent Labs    12/10/20 1028 12/10/20 1502 02/17/21 0936  LIPASE 1,197* 462* 12.0  AMYLASE 363*  --   --  No results for input(s): AMMONIA in the last 8760 hours. CBC: Recent Labs    10/09/20 0928 12/10/20 1028 12/10/20 1502  WBC 8.7 11.1* 12.8*  NEUTROABS 6,299 8,902* 9.4*  HGB 16.0 15.8 15.9  HCT 48.7 46.5 47.1  MCV 82.8 82.4 82.8  PLT 275 251 260   Lipid Panel: Recent Labs    10/09/20 0928  CHOL 166  HDL 45  LDLCALC 98  TRIG 124  CHOLHDL 3.7   TSH: No results for input(s): TSH in the last 8760 hours. A1C: Lab Results  Component Value Date   HGBA1C 5.6 04/01/2020     Assessment/Plan 1. Mixed hyperlipidemia -continues on lipitor, dietary modifications enocuraged. - EKG 12-Lead SR rate 68  2. Supraventricular tachycardia (HCC) -rate controlled, no palpitations noted or recent episodes  3. Gastroesophageal reflux disease with esophagitis without hemorrhage -based on hisotry appears that chest pain/pressure due to GERD. Dietary modifications encouraged. If persist can add medication as needed or routine.   4. Class 1 obesity due to excess calories without serious comorbidity with body mass index (BMI) of 32.0 to 32.9 in adult --education provided on healthy weight loss through increase in physical activity and proper nutrition   5. Ear pressure, left -to start zyrtec 10 mg daily due to increase sinus and ear pressure  6. Chest pain EKG without abnormality, based on history appears to be related to GERD.   Return in about 6 months (around 10/14/2021) for routine follow up, labs prior to appt . Sooner if needed  [employer]. [place], [place]  Adult [facility] [phone]

## 2021-04-16 NOTE — Patient Instructions (Addendum)
To use zyrtec 10 mg da

## 2021-04-19 ENCOUNTER — Encounter: Payer: Self-pay | Admitting: Gastroenterology

## 2021-04-21 ENCOUNTER — Telehealth: Payer: Self-pay | Admitting: Gastroenterology

## 2021-04-21 NOTE — Telephone Encounter (Signed)
Hey Dr. Tomasa Rand,  ? ?Patient called in to reschedule appt for 3/6 due to unable to get off work. Patient rescheduled for 4/5. ? ?Thank you ?

## 2021-04-26 ENCOUNTER — Encounter: Payer: BC Managed Care – PPO | Admitting: Gastroenterology

## 2021-05-21 ENCOUNTER — Ambulatory Visit: Payer: BC Managed Care – PPO | Admitting: Nurse Practitioner

## 2021-05-26 ENCOUNTER — Encounter: Payer: Self-pay | Admitting: Gastroenterology

## 2021-05-26 ENCOUNTER — Ambulatory Visit (AMBULATORY_SURGERY_CENTER): Payer: BC Managed Care – PPO | Admitting: Gastroenterology

## 2021-05-26 VITALS — BP 109/69 | HR 80 | Temp 98.1°F | Resp 12 | Ht 75.0 in | Wt 263.0 lb

## 2021-05-26 DIAGNOSIS — K319 Disease of stomach and duodenum, unspecified: Secondary | ICD-10-CM

## 2021-05-26 DIAGNOSIS — Z1211 Encounter for screening for malignant neoplasm of colon: Secondary | ICD-10-CM

## 2021-05-26 DIAGNOSIS — R1013 Epigastric pain: Secondary | ICD-10-CM | POA: Diagnosis not present

## 2021-05-26 DIAGNOSIS — K2100 Gastro-esophageal reflux disease with esophagitis, without bleeding: Secondary | ICD-10-CM

## 2021-05-26 MED ORDER — OMEPRAZOLE 40 MG PO CPDR: 40.0000 mg | DELAYED_RELEASE_CAPSULE | Freq: Every day | ORAL | 3 refills | Status: DC

## 2021-05-26 MED ORDER — SODIUM CHLORIDE 0.9 % IV SOLN
500.0000 mL | Freq: Once | INTRAVENOUS | Status: DC
Start: 2021-05-26 — End: 2021-05-26

## 2021-05-26 NOTE — Op Note (Signed)
[facility] ?Patient Name: Taylor Kidd ?Procedure Date: 05/26/2021 1:27 PM ?MRN: [id number] ?Endoscopist: Marchelle Rinella E. Tomasa Rand , MD ?Age: 51 ?Referring MD:  ?Date of Birth: 06-Jan-1971 ?Gender: Male ?Account #: [id number] ?Procedure:                Upper GI endoscopy ?Indications:              Heartburn, Dyspepsia ?Medicines:                Monitored Anesthesia Care ?Procedure:                Pre-Anesthesia Assessment: ?                          - Prior to the procedure, a History and Physical  ?                          was performed, and patient medications and  ?                          allergies were reviewed. The patient's tolerance of  ?                          previous anesthesia was also reviewed. The risks  ?                          and benefits of the procedure and the sedation  ?                          options and risks were discussed with the patient.  ?                          All questions were answered, and informed consent  ?                          was obtained. Prior Anticoagulants: The patient has  ?                          taken no previous anticoagulant or antiplatelet  ?                          agents. ASA Grade Assessment: II - A patient with  ?                          mild systemic disease. After reviewing the risks  ?                          and benefits, the patient was deemed in  ?                          satisfactory condition to undergo the procedure. ?                          After obtaining informed consent, the endoscope was  ?  passed under direct vision. Throughout the  ?                          procedure, the patient's blood pressure, pulse, and  ?                          oxygen saturations were monitored continuously. The  ?                          Endoscope was introduced through the mouth, and  ?                          advanced to the third part of duodenum. The upper  ?                          GI endoscopy was accomplished  without difficulty.  ?                          The patient tolerated the procedure well. ?Scope In: ?Scope Out: ?Findings:                 The examined portions of the nasopharynx,  ?                          oropharynx and larynx were normal. ?                          LA Grade A (one or more mucosal breaks less than 5  ?                          mm, not extending between tops of 2 mucosal folds)  ?                          esophagitis with no bleeding was found. ?                          The exam of the esophagus was otherwise normal. ?                          Multiple dispersed small erosions with surrounding  ?                          erythema with scant hematin were found in the  ?                          gastric body and in the gastric antrum. Biopsies  ?                          were taken with a cold forceps for Helicobacter  ?                          pylori testing. Estimated blood loss was minimal. ?  The exam of the stomach was otherwise normal. ?                          The examined duodenum was normal. ?Complications:            No immediate complications. ?Estimated Blood Loss:     Estimated blood loss was minimal. ?Impression:               - The examined portions of the nasopharynx,  ?                          oropharynx and larynx were normal. ?                          - LA Grade A reflux esophagitis with no bleeding. ?                          - Erosive gastropathy with scant hematin. Biopsied. ?                          - Normal examined duodenum. ?Recommendation:           - Patient has a contact number available for  ?                          emergencies. The signs and symptoms of potential  ?                          delayed complications were discussed with the  ?                          patient. Return to normal activities tomorrow.  ?                          Written discharge instructions were provided to the  ?                          patient. ?                           - Resume previous diet. ?                          - Continue present medications. ?                          - Await pathology results. ?                          - Use Prilosec (omeprazole) 40 mg PO daily for 6  ?                          weeks to heal esophagitis and erosive gastropathy. ?                          - Avoid NSAIDs ?Anntionette Madkins E. Tomasa Randunningham, MD ?05/26/2021 2:09:55 PM ?This report has been  signed electronically. ?

## 2021-05-26 NOTE — Progress Notes (Signed)
[facility] History and Physical ? ? ?Primary Care Physician:  Lauree Chandler, NP ? ? ?Reason for Procedure:   GERD/dyspepsia, colon cancer screening ? ?Plan:    EGD, colonoscopy ? ? ? ? ?HPI: Emil Stahlnecker is a 51 y.o. male undergoing initial average risk screening colonoscopy.  He has no family history of colon cancer and no chronic GI symptoms. He has chronic symptoms of upper abdominal discomfort, heartburn and acid regurgitation. ? ? ?Past Medical History:  ?Diagnosis Date  ? Anxiety   ? Arthritis   ? Depression   ? GERD (gastroesophageal reflux disease)   ? Heartburn   ? Hematuria, unspecified   ? Lumbago   ? Other abnormal blood chemistry   ? Other malaise and fatigue   ? Palpitations   ? Unspecified vitamin D deficiency   ? ? ?Past Surgical History:  ?Procedure Laterality Date  ? SHOULDER ARTHROSCOPY WITH ROTATOR CUFF REPAIR AND SUBACROMIAL DECOMPRESSION Right 12/22/2016  ? Procedure: Right shoulder arthroscopy, subacromial decompression, mini open rotator cuff repair;  Surgeon: Susa Day, MD;  Location: [facility];  Service: Orthopedics;  Laterality: Right;  120 mins  ? ? ?Prior to Admission medications   ?Medication Sig Start Date End Date Taking? Authorizing Provider  ?acetaminophen (TYLENOL) 500 MG tablet Take 500 mg by mouth as needed for mild pain.    Yes [provider]  ?atorvastatin (LIPITOR) 20 MG tablet TAKE 1 TABLET BY MOUTH EVERY DAY 12/09/20  Yes Lauree Chandler, NP  ?cholecalciferol (VITAMIN D3) 25 MCG (1000 UNIT) tablet Take 1,000 Units by mouth 3 (three) times a week.   Yes [provider]  ?Testosterone 30 MG/ACT SOLN APPLY ONE PUMP UNDER THE ARM ONCE DAILY 01/28/21  Yes Lauree Chandler, NP  ?vitamin B-12 (CYANOCOBALAMIN) 1000 MCG tablet Take 1,000 mcg by mouth daily.   Yes [provider]  ?diphenhydrAMINE-APAP, sleep, (GOODY PM PO) Take 1 Package by mouth at bedtime as needed (sleep).    [provider]  ? ? ?Current Outpatient  Medications  ?Medication Sig Dispense Refill  ? acetaminophen (TYLENOL) 500 MG tablet Take 500 mg by mouth as needed for mild pain.     ? atorvastatin (LIPITOR) 20 MG tablet TAKE 1 TABLET BY MOUTH EVERY DAY 90 tablet 1  ? cholecalciferol (VITAMIN D3) 25 MCG (1000 UNIT) tablet Take 1,000 Units by mouth 3 (three) times a week.    ? Testosterone 30 MG/ACT SOLN APPLY ONE PUMP UNDER THE ARM ONCE DAILY 90 mL 0  ? vitamin B-12 (CYANOCOBALAMIN) 1000 MCG tablet Take 1,000 mcg by mouth daily.    ? diphenhydrAMINE-APAP, sleep, (GOODY PM PO) Take 1 Package by mouth at bedtime as needed (sleep).    ? ?Current Facility-Administered Medications  ?Medication Dose Route Frequency Provider Last Rate Last Admin  ? 0.9 %  sodium chloride infusion  500 mL Intravenous Once Daryel November, MD      ? ? ?Allergies as of 05/26/2021  ? (No Known Allergies)  ? ? ?Family History  ?Problem Relation Age of Onset  ? Colon cancer Neg Hx   ? Esophageal cancer Neg Hx   ? Rectal cancer Neg Hx   ? Stomach cancer Neg Hx   ? ? ?Social History  ? ?Socioeconomic History  ? Marital status: Married  ?  Spouse name: Not on file  ? Number of children: 3  ? Years of education: Not on file  ? Highest education level: Not on file  ?Occupational History  ?  Not on file  ?Tobacco Use  ? Smoking status: Former  ?  Packs/day: 0.50  ?  Years: 30.00  ?  Pack years: 15.00  ?  Types: Cigarettes  ?  Quit date: 03/23/2017  ?  Years since quitting: 4.1  ? Smokeless tobacco: Never  ?Vaping Use  ? Vaping Use: Former  ?Substance and Sexual Activity  ? Alcohol use: Yes  ?  Alcohol/week: 0.0 standard drinks  ?  Comment: rarely   ? Drug use: No  ? Sexual activity: Yes  ?Other Topics Concern  ? Not on file  ?Social History Narrative  ? Not on file  ? ?Social Determinants of Health  ? ?Financial Resource Strain: Not on file  ?Food Insecurity: Not on file  ?Transportation Needs: Not on file  ?Physical Activity: Not on file  ?Stress: Not on file  ?Social Connections: Not on file   ?Intimate Partner Violence: Not on file  ? ? ?Review of Systems: ? ?All other review of systems negative except as mentioned in the HPI. ? ?Physical Exam: ?Vital signs ?BP (!) 122/92   Pulse 92   Temp 98.1 ?F (36.7 ?C) (Temporal)   Ht 6\' 3"  (1.905 m)   Wt 263 lb (119.3 kg)   SpO2 96%   BMI 32.87 kg/m?  ? ?General:   Alert,  Well-developed, well-nourished, pleasant and cooperative in NAD ?Airway:  Mallampati 3 ?Lungs:  Clear throughout to auscultation.   ?Heart:  Regular rate and rhythm; no murmurs, clicks, rubs,  or gallops. ?Abdomen:  Soft, nontender and nondistended. Normal bowel sounds.   ?Neuro/Psych:  Normal mood and affect. A and O x 3 ? ? ?Brayan Votaw E. Candis Schatz, MD ?[facility] ? ?

## 2021-05-26 NOTE — Patient Instructions (Addendum)
HANDOUTS PROVIDED ON: ESOPHAGITIS & DIVERTICULOSIS ? ?The biopsies taken today have been sent for pathology.  The results can take 1-3 weeks to receive.   ? ?Your next colonoscopy should occur in 10 years.   ? ?You may resume your previous diet and medication schedule.  Avoid aspirin, naproxen, ibuprofen, or any other NSAIDs. ? ?A prescription for omeprazole (Prilosec) 40 mg tablets has been sent to your pharmacy.  This is to be taken daily 30-60 min before your first meal of the day. ? ?Thank you for allowing [place] to care for you today!!! ? ? ?YOU HAD AN ENDOSCOPIC PROCEDURE TODAY AT [facility]:   Refer to the procedure report that was given to you for any specific questions about what was found during the examination.  If the procedure report does not answer your questions, please call your gastroenterologist to clarify.  If you requested that your care partner not be given the details of your procedure findings, then the procedure report has been included in a sealed envelope for you to review at your convenience later. ? ?YOU SHOULD EXPECT: Some feelings of bloating in the abdomen. Passage of more gas than usual.  Walking can help get rid of the air that was put into your GI tract during the procedure and reduce the bloating. If you had a lower endoscopy (such as a colonoscopy or flexible sigmoidoscopy) you may notice spotting of blood in your stool or on the toilet paper. If you underwent a bowel prep for your procedure, you may not have a normal bowel movement for a few days. ? ?Please Note:  You might notice some irritation and congestion in your nose or some drainage.  This is from the oxygen used during your procedure.  There is no need for concern and it should clear up in a day or so. ? ?SYMPTOMS TO REPORT IMMEDIATELY: ? ?Following lower endoscopy (colonoscopy or flexible sigmoidoscopy): ? Excessive amounts of blood in the stool ? Significant tenderness or worsening of abdominal  pains ? Swelling of the abdomen that is new, acute ? Fever of 100?F or higher ? ?Following upper endoscopy (EGD) ? Vomiting of blood or coffee ground material ? New chest pain or pain under the shoulder blades ? Painful or persistently difficult swallowing ? New shortness of breath ? Fever of 100?F or higher ? Black, tarry-looking stools ? ?For urgent or emergent issues, a gastroenterologist can be reached at any hour by calling [phone]. ?Do not use MyChart messaging for urgent concerns.  ? ? ?DIET:  We do recommend a small meal at first, but then you may proceed to your regular diet.  Drink plenty of fluids but you should avoid alcoholic beverages for 24 hours. ? ?ACTIVITY:  You should plan to take it easy for the rest of today and you should NOT DRIVE or use heavy machinery until tomorrow (because of the sedation medicines used during the test).   ? ?FOLLOW UP: ?Our staff will call the number listed on your records Monday morning between 7:15 am and 8:15 am following your procedure to check on you and address any questions or concerns that you may have regarding the information given to you following your procedure. If we do not reach you, we will leave a message.  We will attempt to reach you two times.  During this call, we will ask if you have developed any symptoms of COVID 19. If you develop any symptoms (ie: fever, flu-like symptoms, shortness  of breath, cough etc.) before then, please call [phone].  If you test positive for Covid 19 in the 2 weeks post procedure, please call and report this information to [place].   ? ?If any biopsies were taken you will be contacted by phone or by letter within the next 1-3 weeks.  Please call us at [phone] if you have not heard about the biopsies in 3 weeks.  ? ? ?SIGNATURES/CONFIDENTIALITY: ?You and/or your care partner have signed paperwork which will be entered into your electronic medical record.  These signatures attest to the fact that that the  information above on your After Visit Summary has been reviewed and is understood.  Full responsibility of the confidentiality of this discharge information lies with you and/or your care-partner. ? ?

## 2021-05-26 NOTE — Progress Notes (Signed)
Report to PACU, RN, vss, BBS= Clear.  

## 2021-05-26 NOTE — Progress Notes (Signed)
Called to room to assist during endoscopic procedure.  Patient ID and intended procedure confirmed with present staff. Received instructions for my participation in the procedure from the performing physician.  

## 2021-05-26 NOTE — Op Note (Signed)
[facility] ?Patient Name: Taylor MediciDavid Geller ?Procedure Date: 05/26/2021 1:26 PM ?MRN: [id number] ?Endoscopist: Macyn Shropshire E. Tomasa Randunningham , MD ?Age: 51 ?Referring MD:  ?Date of Birth: Nov 10, 1970 ?Gender: Male ?Account #: [id number] ?Procedure:                Colonoscopy ?Indications:              Screening for colorectal malignant neoplasm, This  ?                          is the patient's first colonoscopy ?Medicines:                Monitored Anesthesia Care ?Procedure:                Pre-Anesthesia Assessment: ?                          - Prior to the procedure, a History and Physical  ?                          was performed, and patient medications and  ?                          allergies were reviewed. The patient's tolerance of  ?                          previous anesthesia was also reviewed. The risks  ?                          and benefits of the procedure and the sedation  ?                          options and risks were discussed with the patient.  ?                          All questions were answered, and informed consent  ?                          was obtained. Prior Anticoagulants: The patient has  ?                          taken no previous anticoagulant or antiplatelet  ?                          agents. ASA Grade Assessment: II - A patient with  ?                          mild systemic disease. After reviewing the risks  ?                          and benefits, the patient was deemed in  ?                          satisfactory condition to undergo the procedure. ?  After obtaining informed consent, the colonoscope  ?                          was passed under direct vision. Throughout the  ?                          procedure, the patient's blood pressure, pulse, and  ?                          oxygen saturations were monitored continuously. The  ?                          CF [id number] #[id number] was introduced through the anus  ?                          and advanced to the  the terminal ileum, with  ?                          identification of the appendiceal orifice and IC  ?                          valve. The colonoscopy was performed without  ?                          difficulty. The patient tolerated the procedure  ?                          well. The quality of the bowel preparation was  ?                          adequate. The terminal ileum, ileocecal valve,  ?                          appendiceal orifice, and rectum were photographed. ?Scope In: 1:46:51 PM ?Scope Out: 2:00:35 PM ?Scope Withdrawal Time: 0 hours 11 minutes 19 seconds  ?Total Procedure Duration: 0 hours 13 minutes 44 seconds  ?Findings:                 The perianal and digital rectal examinations were  ?                          normal. Pertinent negatives include normal  ?                          sphincter tone and no palpable rectal lesions. ?                          A few small-mouthed diverticula were found in the  ?                          descending colon and transverse colon. ?                          The exam was otherwise normal throughout the  ?  examined colon. ?                          The terminal ileum appeared normal. ?                          The retroflexed view of the distal rectum and anal  ?                          verge was normal and showed no anal or rectal  ?                          abnormalities. ?Complications:            No immediate complications. ?Estimated Blood Loss:     Estimated blood loss: none. ?Impression:               - Diverticulosis in the descending colon and in the  ?                          transverse colon. ?                          - The examined portion of the ileum was normal. ?                          - The distal rectum and anal verge are normal on  ?                          retroflexion view. ?                          - No specimens collected. ?Recommendation:           - Patient has a contact number available for  ?                           emergencies. The signs and symptoms of potential  ?                          delayed complications were discussed with the  ?                          patient. Return to normal activities tomorrow.  ?                          Written discharge instructions were provided to the  ?                          patient. ?                          - Resume previous diet. ?                          - Continue present medications. ?                          -  Repeat colonoscopy in 10 years for screening  ?                          purposes. ?Daryan Cagley E. Tomasa Rand, MD ?05/26/2021 2:12:29 PM ?This report has been signed electronically. ?

## 2021-05-27 ENCOUNTER — Telehealth: Payer: Self-pay

## 2021-05-27 NOTE — Telephone Encounter (Signed)
Referral faxed to [facility] for possible gallbladder removal. Records faxed also. ?

## 2021-05-27 NOTE — Telephone Encounter (Signed)
-----   Message from Jenel Lucks, MD sent at 05/26/2021  4:39 PM EDT ----- ?Regarding: Surgery referral for possible cholecystectomy ?Taylor Kidd,  ?Can you please place a surgery referral for Mr. Landress?  History of recurrent acute pancreatitis with unclear etiology, consider cholecystectomy for possible sludge as etiology of pancreatitis ? ?

## 2021-05-31 ENCOUNTER — Telehealth: Payer: Self-pay

## 2021-05-31 NOTE — Telephone Encounter (Signed)
?  Follow up Call- ? ? ?  05/26/2021  ? 12:43 PM  ?Call back number  ?Post procedure Call Back phone  # [phone]  ?Permission to leave phone message Yes  ?  ? ?Patient questions: ? ?Do you have a fever, pain , or abdominal swelling? No. ?Pain Score  0 * ? ?Have you tolerated food without any problems? Yes.   ? ?Have you been able to return to your normal activities? Yes.   ? ?Do you have any questions about your discharge instructions: ?Diet   No. ?Medications  No. ?Follow up visit  No. ? ?Do you have questions or concerns about your Care? No. ? ?Actions: ?* If pain score is 4 or above: ?No action needed, pain <4. ? ? ? ? ?

## 2021-06-03 NOTE — Progress Notes (Signed)
Taylor Kidd,  ?The biopsies taken from your stomach were notable for mild reactive gastropathy which is a common finding and often related to use of certain medications (usually NSAIDs), but there was no evidence of Helicobacter pylori infection. I recommend you avoid NSAIDs if at all possible and take the omeprazole as discussed. ?Follow up with General Surgery as discussed to see if they feel a cholecystectomy would be beneficial for you.

## 2021-06-11 ENCOUNTER — Other Ambulatory Visit: Payer: Self-pay | Admitting: Nurse Practitioner

## 2021-07-09 ENCOUNTER — Other Ambulatory Visit: Payer: Self-pay | Admitting: General Surgery

## 2021-07-09 DIAGNOSIS — K8500 Idiopathic acute pancreatitis without necrosis or infection: Secondary | ICD-10-CM

## 2021-07-16 NOTE — Progress Notes (Unsigned)
Rexford Maus, NP Reason for referral-preoperative evaluation prior to cholecystectomy and palpitations  HPI: 51 year old male for evaluation of palpitations and preoperatively prior to cholecystectomy and request of Abbey Chatters, NP.  Patient has a history of pancreatitis and will require cholecystectomy.  We were asked to evaluate preoperatively.  He is also complaining of palpitations.  Current Outpatient Medications  Medication Sig Dispense Refill   acetaminophen (TYLENOL) 500 MG tablet Take 500 mg by mouth as needed for mild pain.      atorvastatin (LIPITOR) 20 MG tablet TAKE 1 TABLET BY MOUTH EVERY DAY 90 tablet 1   cholecalciferol (VITAMIN D3) 25 MCG (1000 UNIT) tablet Take 1,000 Units by mouth 3 (three) times a week.     diphenhydrAMINE-APAP, sleep, (GOODY PM PO) Take 1 Package by mouth at bedtime as needed (sleep).     omeprazole (PRILOSEC) 40 MG capsule Take 1 capsule (40 mg total) by mouth daily. 30 capsule 3   Testosterone 30 MG/ACT SOLN APPLY ONE PUMP UNDER THE ARM ONCE DAILY 90 mL 0   vitamin B-12 (CYANOCOBALAMIN) 1000 MCG tablet Take 1,000 mcg by mouth daily.     No current facility-administered medications for this visit.    No Known Allergies   Past Medical History:  Diagnosis Date   Anxiety    Arthritis    Depression    GERD (gastroesophageal reflux disease)    Heartburn    Hematuria, unspecified    Lumbago    Other abnormal blood chemistry    Other malaise and fatigue    Palpitations    Unspecified vitamin D deficiency     Past Surgical History:  Procedure Laterality Date   SHOULDER ARTHROSCOPY WITH ROTATOR CUFF REPAIR AND SUBACROMIAL DECOMPRESSION Right 12/22/2016   Procedure: Right shoulder arthroscopy, subacromial decompression, mini open rotator cuff repair;  Surgeon: Jene Every, MD;  Location: [facility];  Service: Orthopedics;  Laterality: Right;  120 mins    Social History   Socioeconomic History   Marital status: Married     Spouse name: Not on file   Number of children: 3   Years of education: Not on file   Highest education level: Not on file  Occupational History   Not on file  Tobacco Use   Smoking status: Former    Packs/day: 0.50    Years: 30.00    Pack years: 15.00    Types: Cigarettes    Quit date: 03/23/2017    Years since quitting: 4.3   Smokeless tobacco: Never  Vaping Use   Vaping Use: Former  Substance and Sexual Activity   Alcohol use: Yes    Alcohol/week: 0.0 standard drinks    Comment: rarely    Drug use: No   Sexual activity: Yes  Other Topics Concern   Not on file  Social History Narrative   Not on file   Social Determinants of Health   Financial Resource Strain: Not on file  Food Insecurity: Not on file  Transportation Needs: Not on file  Physical Activity: Not on file  Stress: Not on file  Social Connections: Not on file  Intimate Partner Violence: Not on file    Family History  Problem Relation Age of Onset   Colon cancer Neg Hx    Esophageal cancer Neg Hx    Rectal cancer Neg Hx    Stomach cancer Neg Hx     ROS: no fevers or chills, productive cough, hemoptysis, dysphasia, odynophagia, melena, hematochezia, dysuria, hematuria, rash, seizure activity, orthopnea,  PND, pedal edema, claudication. Remaining systems are negative.  Physical Exam:   There were no vitals taken for this visit.  General:  Well developed/well nourished in NAD Skin warm/dry Patient not depressed No peripheral clubbing Back-normal HEENT-normal/normal eyelids Neck supple/normal carotid upstroke bilaterally; no bruits; no JVD; no thyromegaly chest - CTA/ normal expansion CV - RRR/normal S1 and S2; no murmurs, rubs or gallops;  PMI nondisplaced Abdomen -NT/ND, no HSM, no mass, + bowel sounds, no bruit 2+ femoral pulses, no bruits Ext-no edema, chords, 2+ DP Neuro-grossly nonfocal  ECG -April 16, 2021-normal sinus rhythm with no ST changes.  Personally reviewed  A/P  1  preoperative evaluation prior to cholecystectomy-  2 palpitations-  3 pancreatitis-  Olga Millers, MD

## 2021-07-20 ENCOUNTER — Encounter: Payer: Self-pay | Admitting: Cardiology

## 2021-07-20 ENCOUNTER — Ambulatory Visit (INDEPENDENT_AMBULATORY_CARE_PROVIDER_SITE_OTHER): Payer: BC Managed Care – PPO | Admitting: Cardiology

## 2021-07-20 ENCOUNTER — Ambulatory Visit
Admission: RE | Admit: 2021-07-20 | Discharge: 2021-07-20 | Disposition: A | Discharge status: Home / Self Care | Payer: BC Managed Care – PPO | Source: Ambulatory Visit | Attending: General Surgery | Admitting: General Surgery

## 2021-07-20 VITALS — BP 130/84 | HR 73 | Ht 75.0 in | Wt 257.8 lb

## 2021-07-20 DIAGNOSIS — R002 Palpitations: Secondary | ICD-10-CM

## 2021-07-20 DIAGNOSIS — Z01810 Encounter for preprocedural cardiovascular examination: Secondary | ICD-10-CM

## 2021-07-20 DIAGNOSIS — K8500 Idiopathic acute pancreatitis without necrosis or infection: Secondary | ICD-10-CM

## 2021-07-20 NOTE — Patient Instructions (Signed)
  Testing/Procedures:  Your physician has requested that you have an echocardiogram. Echocardiography is a painless test that uses sound waves to create images of your heart. It provides your doctor with information about the size and shape of your heart and how well your heart's chambers and valves are working. This procedure takes approximately one hour. There are no restrictions for this procedure. [address]   Follow-Up: At [facility], you and your health needs are our priority.  As part of our continuing mission to provide you with exceptional heart care, we have created designated Provider Care Teams.  These Care Teams include your primary Cardiologist (physician) and Advanced Practice Providers (APPs -  Physician Assistants and Nurse Practitioners) who all work together to provide you with the care you need, when you need it.  We recommend signing up for the patient portal called "MyChart".  Sign up information is provided on this After Visit Summary.  MyChart is used to connect with patients for Virtual Visits (Telemedicine).  Patients are able to view lab/test results, encounter notes, upcoming appointments, etc.  Non-urgent messages can be sent to your provider as well.   To learn more about what you can do with MyChart, go to [email or web address].    Your next appointment:   4-6 month(s)  The format for your next appointment:   In Person  Provider:   Olga Millers MD     Important Information About Sugar

## 2021-08-09 ENCOUNTER — Ambulatory Visit (HOSPITAL_COMMUNITY): Payer: BC Managed Care – PPO | Attending: Internal Medicine

## 2021-08-09 DIAGNOSIS — R002 Palpitations: Secondary | ICD-10-CM | POA: Diagnosis not present

## 2021-08-09 DIAGNOSIS — Z01810 Encounter for preprocedural cardiovascular examination: Secondary | ICD-10-CM

## 2021-08-09 LAB — ECHOCARDIOGRAM COMPLETE
Area-P 1/2: 2.99 cm2
S' Lateral: 3.6 cm

## 2021-10-01 ENCOUNTER — Other Ambulatory Visit: Payer: Self-pay | Admitting: Gastroenterology

## 2021-10-01 DIAGNOSIS — R1013 Epigastric pain: Secondary | ICD-10-CM

## 2021-10-21 ENCOUNTER — Encounter: Payer: Self-pay | Admitting: Nurse Practitioner

## 2021-10-22 ENCOUNTER — Encounter: Payer: BC Managed Care – PPO | Admitting: Nurse Practitioner

## 2021-10-26 NOTE — Progress Notes (Signed)
Pt was a no-show for appt

## 2021-11-01 ENCOUNTER — Ambulatory Visit (INDEPENDENT_AMBULATORY_CARE_PROVIDER_SITE_OTHER): Payer: BC Managed Care – PPO | Admitting: Nurse Practitioner

## 2021-11-01 ENCOUNTER — Encounter: Payer: Self-pay | Admitting: Nurse Practitioner

## 2021-11-01 VITALS — BP 130/84 | HR 83 | Temp 97.9°F | Ht 75.0 in | Wt 259.0 lb

## 2021-11-01 DIAGNOSIS — R0981 Nasal congestion: Secondary | ICD-10-CM

## 2021-11-01 DIAGNOSIS — I471 Supraventricular tachycardia, unspecified: Secondary | ICD-10-CM

## 2021-11-01 DIAGNOSIS — E66811 Other obesity due to excess calories: Secondary | ICD-10-CM

## 2021-11-01 DIAGNOSIS — E6609 Other obesity due to excess calories: Secondary | ICD-10-CM

## 2021-11-01 DIAGNOSIS — R350 Frequency of micturition: Secondary | ICD-10-CM | POA: Diagnosis not present

## 2021-11-01 DIAGNOSIS — M546 Pain in thoracic spine: Secondary | ICD-10-CM

## 2021-11-01 DIAGNOSIS — Z6832 Body mass index (BMI) 32.0-32.9, adult: Secondary | ICD-10-CM

## 2021-11-01 DIAGNOSIS — K2100 Gastro-esophageal reflux disease with esophagitis, without bleeding: Secondary | ICD-10-CM | POA: Diagnosis not present

## 2021-11-01 DIAGNOSIS — E782 Mixed hyperlipidemia: Secondary | ICD-10-CM

## 2021-11-01 DIAGNOSIS — G8929 Other chronic pain: Secondary | ICD-10-CM

## 2021-11-01 DIAGNOSIS — E669 Obesity, unspecified: Secondary | ICD-10-CM

## 2021-11-01 NOTE — Patient Instructions (Signed)
To use nasal rinse or nettipot daily Continue zyrtec

## 2021-11-01 NOTE — Progress Notes (Unsigned)
Careteam: Patient Care Team: Sharon Seller, NP as PCP - General (Geriatric Medicine) Sheran Luz, MD as Consulting Physician (Physical Medicine and Rehabilitation) Pati Gallo, MD as Consulting Physician (Sports Medicine)  PLACE OF SERVICE:  [facility]  Advanced Directive information Does Patient Have a Medical Advance Directive?: Yes, Type of Advance Directive: Healthcare Power of [place];Living will, Does patient want to make changes to medical advance directive?: No - Patient declined  No Known Allergies  Chief Complaint  Patient presents with   Medical Management of Chronic Issues    6 month follow-up and fasting labs. Discuss need for microalbumin, shingrix, and flu vaccine(refused flu vaccine today). Pain on right lower side of back. Congestion off/on x 6 weeks.      HPI: Patient is a 51 y.o. male ***  Review of Systems:  ROS***  Past Medical History:  Diagnosis Date   Anxiety    Arthritis    Depression    GERD (gastroesophageal reflux disease)    Heartburn    Hematuria, unspecified    Hyperlipidemia    Lumbago    Other abnormal blood chemistry    Other malaise and fatigue    Palpitations    Unspecified vitamin D deficiency    Past Surgical History:  Procedure Laterality Date   SHOULDER ARTHROSCOPY WITH ROTATOR CUFF REPAIR AND SUBACROMIAL DECOMPRESSION Right 12/22/2016   Procedure: Right shoulder arthroscopy, subacromial decompression, mini open rotator cuff repair;  Surgeon: Jene Every, MD;  Location: [facility];  Service: Orthopedics;  Laterality: Right;  120 mins   Social History:   reports that he quit smoking about 4 years ago. His smoking use included cigarettes. He has a 15.00 pack-year smoking history. He has never used smokeless tobacco. He reports current alcohol use. He reports that he does not use drugs.  Family History  Problem Relation Age of Onset   Hypertension Mother    Colon cancer Neg Hx    Esophageal cancer Neg Hx     Rectal cancer Neg Hx    Stomach cancer Neg Hx     Medications: Patient's Medications  New Prescriptions   No medications on file  Previous Medications   ACETAMINOPHEN (TYLENOL) 500 MG TABLET    Take 500 mg by mouth as needed for mild pain.    ATORVASTATIN (LIPITOR) 20 MG TABLET    TAKE 1 TABLET BY MOUTH EVERY DAY   CHOLECALCIFEROL (VITAMIN D3) 25 MCG (1000 UNIT) TABLET    Take 1,000 Units by mouth 3 (three) times a week.   DIPHENHYDRAMINE-APAP, SLEEP, (GOODY PM PO)    Take 1 Package by mouth at bedtime as needed (sleep).   OMEPRAZOLE (PRILOSEC) 40 MG CAPSULE    TAKE 1 CAPSULE (40 MG TOTAL) BY MOUTH DAILY.   TESTOSTERONE 30 MG/ACT SOLN    APPLY ONE PUMP UNDER THE ARM ONCE DAILY   VITAMIN B-12 (CYANOCOBALAMIN) 1000 MCG TABLET    Take 1,000 mcg by mouth daily.  Modified Medications   No medications on file  Discontinued Medications   No medications on file    Physical Exam:  Vitals:   11/01/21 0859  BP: 130/84  Pulse: 83  Temp: 97.9 F (36.6 C)  TempSrc: Temporal  SpO2: 97%  Weight: 259 lb (117.5 kg)  Height: 6\' 3"  (1.905 m)   Body mass index is 32.37 kg/m. Wt Readings from Last 3 Encounters:  11/01/21 259 lb (117.5 kg)  07/20/21 257 lb 12.8 oz (116.9 kg)  05/26/21 263 lb (119.3 kg)  Physical Exam***  Labs reviewed: Basic Metabolic Panel: Recent Labs    12/10/20 1028 12/10/20 1502  NA 137 136  K 3.9 4.1  CL 102 103  CO2 28 26  GLUCOSE 126 87  BUN 22 22*  CREATININE 0.91 0.93  CALCIUM 9.7 9.5   Liver Function Tests: Recent Labs    12/10/20 1028 12/10/20 1502  AST 17 22  ALT 21 24  ALKPHOS  --  95  BILITOT 0.7 0.7  PROT 6.8 7.6  ALBUMIN  --  4.2   Recent Labs    12/10/20 1028 12/10/20 1502 02/17/21 0936  LIPASE 1,197* 462* 12.0  AMYLASE 363*  --   --    No results for input(s): "AMMONIA" in the last 8760 hours. CBC: Recent Labs    12/10/20 1028 12/10/20 1502  WBC 11.1* 12.8*  NEUTROABS 8,902* 9.4*  HGB 15.8 15.9  HCT 46.5 47.1   MCV 82.4 82.8  PLT 251 260   Lipid Panel: No results for input(s): "CHOL", "HDL", "LDLCALC", "TRIG", "CHOLHDL", "LDLDIRECT" in the last 8760 hours. TSH: No results for input(s): "TSH" in the last 8760 hours. A1C: Lab Results  Component Value Date   HGBA1C 5.6 04/01/2020     Assessment/Plan There are no diagnoses linked to this encounter.  No follow-ups on file.: *** Anjelita Sheahan K. Biagio Borg  [facility] [phone]

## 2021-11-01 NOTE — Progress Notes (Unsigned)
Careteam: Patient Care Team: Lauree Chandler, NP as PCP - General (Geriatric Medicine) Suella Broad, MD as Consulting Physician (Physical Medicine and Rehabilitation) Berle Mull, MD as Consulting Physician (Sports Medicine)  PLACE OF SERVICE:  [place] Directive information Does Patient Have a Medical Advance Directive?: Yes, Type of Advance Directive: [place];Living will, Does patient want to make changes to medical advance directive?: No - Patient declined  No Known Allergies  Chief Complaint  Patient presents with   Medical Management of Chronic Issues    6 month follow-up and fasting labs. Discuss need for microalbumin, shingrix, and flu vaccine(refused flu vaccine today). Pain on right lower side of back. Congestion off/on x 6 weeks.      HPI: Patient is a 51 y.o. male for routine follow up   Increase frequency at night, goes 3-5 times a night.  No pain. Harder to start stream.    Rt flank pain. Intensity increase when cough, sneeze, move, but pain is there all the time . Does tylenol 500 mg BID, helps a bit. Radiates down to butt sometimes.  No loss of bowel or bladder function.   Palpitations very infrequently, lasts about 4-5 seconds, tunnel vision, maybe 2-3 times in the last 6 months. Saw cardiology and had work up.   Congestion past few weeks comes and goes. Did Zyrtec which helped his eyes.  Gerd well-controlled.  Review of Systems:  Review of Systems  Constitutional:  Negative for chills, fever and weight loss.  HENT:  Positive for congestion. Negative for hearing loss, sinus pain and sore throat.   Eyes:  Negative for blurred vision and double vision.  Respiratory:  Negative for cough, sputum production and shortness of breath.   Cardiovascular:  Positive for palpitations. Negative for chest pain and leg swelling.  Gastrointestinal:  Negative for abdominal pain, constipation, heartburn and vomiting.  Genitourinary:   Positive for frequency. Negative for dysuria and hematuria.  Musculoskeletal:  Positive for back pain.  Skin:  Negative for itching and rash.  Neurological:  Positive for dizziness. Negative for tingling and headaches.  Psychiatric/Behavioral:  Negative for depression. The patient is not nervous/anxious.     Past Medical History:  Diagnosis Date   Anxiety    Arthritis    Depression    GERD (gastroesophageal reflux disease)    Heartburn    Hematuria, unspecified    Hyperlipidemia    Lumbago    Other abnormal blood chemistry    Other malaise and fatigue    Palpitations    Unspecified vitamin D deficiency    Past Surgical History:  Procedure Laterality Date   SHOULDER ARTHROSCOPY WITH ROTATOR CUFF REPAIR AND SUBACROMIAL DECOMPRESSION Right 12/22/2016   Procedure: Right shoulder arthroscopy, subacromial decompression, mini open rotator cuff repair;  Surgeon: Susa Day, MD;  Location: [facility];  Service: Orthopedics;  Laterality: Right;  120 mins   Social History:   reports that he quit smoking about 4 years ago. His smoking use included cigarettes. He has a 15.00 pack-year smoking history. He has never used smokeless tobacco. He reports current alcohol use. He reports that he does not use drugs.  Family History  Problem Relation Age of Onset   Hypertension Mother    Colon cancer Neg Hx    Esophageal cancer Neg Hx    Rectal cancer Neg Hx    Stomach cancer Neg Hx     Medications: Patient's Medications  New Prescriptions   No medications on  file  Previous Medications   ACETAMINOPHEN (TYLENOL) 500 MG TABLET    Take 500 mg by mouth as needed for mild pain.    ATORVASTATIN (LIPITOR) 20 MG TABLET    TAKE 1 TABLET BY MOUTH EVERY DAY   CHOLECALCIFEROL (VITAMIN D3) 25 MCG (1000 UNIT) TABLET    Take 1,000 Units by mouth 3 (three) times a week.   DIPHENHYDRAMINE-APAP, SLEEP, (GOODY PM PO)    Take 1 Package by mouth at bedtime as needed (sleep).   OMEPRAZOLE (PRILOSEC) 40 MG CAPSULE     TAKE 1 CAPSULE (40 MG TOTAL) BY MOUTH DAILY.   TESTOSTERONE 30 MG/ACT SOLN    APPLY ONE PUMP UNDER THE ARM ONCE DAILY   VITAMIN B-12 (CYANOCOBALAMIN) 1000 MCG TABLET    Take 1,000 mcg by mouth daily.  Modified Medications   No medications on file  Discontinued Medications   No medications on file    Physical Exam:  Vitals:   11/01/21 0859  BP: 130/84  Pulse: 83  Temp: 97.9 F (36.6 C)  TempSrc: Temporal  SpO2: 97%  Weight: 117.5 kg  Height: '6\' 3"'  (1.905 m)   Body mass index is 32.37 kg/m. Wt Readings from Last 3 Encounters:  11/01/21 117.5 kg  07/20/21 116.9 kg  05/26/21 119.3 kg    Physical Exam Constitutional:      General: He is not in acute distress. HENT:     Right Ear: Tympanic membrane, ear canal and external ear normal. There is no impacted cerumen.     Left Ear: Tympanic membrane, ear canal and external ear normal. There is no impacted cerumen.     Nose: Congestion present.     Mouth/Throat:     Mouth: Mucous membranes are moist.     Pharynx: Oropharynx is clear. No oropharyngeal exudate.  Eyes:     Conjunctiva/sclera: Conjunctivae normal.     Pupils: Pupils are equal, round, and reactive to light.  Cardiovascular:     Rate and Rhythm: Normal rate and regular rhythm.     Pulses: Normal pulses.     Heart sounds: Normal heart sounds. No murmur heard. Pulmonary:     Effort: Pulmonary effort is normal. No respiratory distress.     Breath sounds: Normal breath sounds.  Abdominal:     General: Abdomen is flat. Bowel sounds are normal.     Palpations: Abdomen is soft.     Hernia: A hernia is present.     Comments: Umbilical hernia, easily reducible, nontender   Musculoskeletal:     Right lower leg: No edema.     Left lower leg: No edema.  Skin:    General: Skin is warm and dry.  Neurological:     Mental Status: He is alert and oriented to person, place, and time. Mental status is at baseline.     Motor: No weakness.  Psychiatric:        Mood and  Affect: Mood normal.        Behavior: Behavior normal.    Labs reviewed: Basic Metabolic Panel: Recent Labs    12/10/20 1028 12/10/20 1502  NA 137 136  K 3.9 4.1  CL 102 103  CO2 28 26  GLUCOSE 126 87  BUN 22 22*  CREATININE 0.91 0.93  CALCIUM 9.7 9.5   Liver Function Tests: Recent Labs    12/10/20 1028 12/10/20 1502  AST 17 22  ALT 21 24  ALKPHOS  --  95  BILITOT 0.7 0.7  PROT 6.8  7.6  ALBUMIN  --  4.2   Recent Labs    12/10/20 1028 12/10/20 1502 02/17/21 0936  LIPASE 1,197* 462* 12.0  AMYLASE 363*  --   --    No results for input(s): "AMMONIA" in the last 8760 hours. CBC: Recent Labs    12/10/20 1028 12/10/20 1502  WBC 11.1* 12.8*  NEUTROABS 8,902* 9.4*  HGB 15.8 15.9  HCT 46.5 47.1  MCV 82.4 82.8  PLT 251 260   Lipid Panel: No results for input(s): "CHOL", "HDL", "LDLCALC", "TRIG", "CHOLHDL", "LDLDIRECT" in the last 8760 hours. TSH: No results for input(s): "TSH" in the last 8760 hours. A1C: Lab Results  Component Value Date   HGBA1C 5.6 04/01/2020     Assessment/Plan 1. Mixed hyperlipidemia - continue lipitor with dietary modifications.  - CMP with eGFR([facility]) - CBC with Differential/Platelet - Lipid panel  2. Supraventricular tachycardia ([place]) - has palpitations infrequently  - has been worked up by cardiology in the past  - report if increase in frequency - CMP with eGFR([facility]) - CBC with Differential/Platelet  3. Gastroesophageal reflux disease with esophagitis without hemorrhage - controlled - continue prilosec with lifestyle modifications.  - CBC with Differential/Platelet  4. Urinary frequency - avoid caffeine and decrease oral intake prior to bed -may benefit from flomax - PSA  5. Obesity (BMI 30-39.9) - continues lifestyle modifications with exercise and diet modifications.   6. Nasal congestion - continue otc zyrtec daily  - trial netti pot daily with nasal spray PRN  7. Chronic right-sided thoracic back  pain - continue tylenol - heating pad to effected area TID  - recommend otc lidocatine patches q 12 hours PRN - stretching   Return in about 6 months (around 05/02/2022) for routine follow up .  Student- Waunita Schooner, RN -I personally was present during the history, physical exam and medical decision-making activities of this service and have verified that the service and findings are accurately documented in the student's note  Salley Boxley K. Pandora, [facility] [phone]

## 2021-11-02 LAB — LIPID PANEL
Cholesterol: 196 mg/dL (ref <200)
HDL: 57 mg/dL (ref >=40)
LDL Cholesterol (Calc): 110 mg/dL (calc) — ABNORMAL HIGH
Non-HDL Cholesterol (Calc): 139 mg/dL (calc) — ABNORMAL HIGH (ref <130)
Total CHOL/HDL Ratio: 3.4 (calc) (ref <5.0)
Triglycerides: 176 mg/dL — ABNORMAL HIGH (ref <150)

## 2021-11-02 LAB — CBC WITH DIFFERENTIAL/PLATELET
Absolute Monocytes: 428 cells/uL (ref 200–950)
Basophils Absolute: 37 cells/uL (ref 0–200)
Basophils Relative: 0.4 %
Eosinophils Absolute: 93 cells/uL (ref 15–500)
Eosinophils Relative: 1 %
HCT: 48.1 % (ref 38.5–50.0)
Hemoglobin: 16.2 g/dL (ref 13.2–17.1)
Lymphs Abs: 1711 cells/uL (ref 850–3900)
MCH: 27.2 pg (ref 27.0–33.0)
MCHC: 33.7 g/dL (ref 32.0–36.0)
MCV: 80.7 fL (ref 80.0–100.0)
MPV: 9.5 fL (ref 7.5–12.5)
Monocytes Relative: 4.6 %
Neutro Abs: 7031 cells/uL (ref 1500–7800)
Neutrophils Relative %: 75.6 %
Platelets: 269 10*3/uL (ref 140–400)
RBC: 5.96 10*6/uL — ABNORMAL HIGH (ref 4.20–5.80)
RDW: 12.7 % (ref 11.0–15.0)
Total Lymphocyte: 18.4 %
WBC: 9.3 10*3/uL (ref 3.8–10.8)

## 2021-11-02 LAB — COMPLETE METABOLIC PANEL WITH GFR
AG Ratio: 1.4 (calc) (ref 1.0–2.5)
ALT: 33 U/L (ref 9–46)
AST: 25 U/L (ref 10–35)
Albumin: 4.3 g/dL (ref 3.6–5.1)
Alkaline phosphatase (APISO): 95 U/L (ref 35–144)
BUN: 15 mg/dL (ref 7–25)
CO2: 26 mmol/L (ref 20–32)
Calcium: 9.8 mg/dL (ref 8.6–10.3)
Chloride: 103 mmol/L (ref 98–110)
Creat: 0.91 mg/dL (ref 0.70–1.30)
Globulin: 3.1 g/dL (calc) (ref 1.9–3.7)
Glucose, Bld: 99 mg/dL (ref 65–99)
Potassium: 4.2 mmol/L (ref 3.5–5.3)
Sodium: 140 mmol/L (ref 135–146)
Total Bilirubin: 0.4 mg/dL (ref 0.2–1.2)
Total Protein: 7.4 g/dL (ref 6.1–8.1)
eGFR: 102 mL/min/{1.73_m2} (ref >=60)

## 2021-11-02 LAB — PSA: PSA: 0.61 ng/mL (ref <=4.00)

## 2021-11-17 NOTE — Progress Notes (Signed)
HPI: FU palpitations.  Echocardiogram June 2023 showed normal LV function, mild left ventricular hypertrophy and mildly dilated aortic root at 41 mm.  Since last seen he has dyspnea with more vigorous activities but not routine activities.  No orthopnea, PND, pedal edema, exertional chest pain or syncope.  Occasional brief flutters but palpitations are improved compared to previous.   Current Outpatient Medications  Medication Sig Dispense Refill   acetaminophen (TYLENOL) 500 MG tablet Take 500 mg by mouth as needed for mild pain.      atorvastatin (LIPITOR) 20 MG tablet TAKE 1 TABLET BY MOUTH EVERY DAY 90 tablet 1   cholecalciferol (VITAMIN D3) 25 MCG (1000 UNIT) tablet Take 1,000 Units by mouth 3 (three) times a week.     omeprazole (PRILOSEC) 40 MG capsule TAKE 1 CAPSULE (40 MG TOTAL) BY MOUTH DAILY. 90 capsule 1   Testosterone 30 MG/ACT SOLN APPLY ONE PUMP UNDER THE ARM ONCE DAILY 90 mL 0   vitamin B-12 (CYANOCOBALAMIN) 1000 MCG tablet Take 1,000 mcg by mouth daily.     No current facility-administered medications for this visit.     Past Medical History:  Diagnosis Date   Anxiety    Arthritis    Depression    GERD (gastroesophageal reflux disease)    Heartburn    Hematuria, unspecified    Hyperlipidemia    Lumbago    Other abnormal blood chemistry    Other malaise and fatigue    Palpitations    Unspecified vitamin D deficiency     Past Surgical History:  Procedure Laterality Date   SHOULDER ARTHROSCOPY WITH ROTATOR CUFF REPAIR AND SUBACROMIAL DECOMPRESSION Right 12/22/2016   Procedure: Right shoulder arthroscopy, subacromial decompression, mini open rotator cuff repair;  Surgeon: Susa Day, MD;  Location: [facility];  Service: Orthopedics;  Laterality: Right;  120 mins    Social History   Socioeconomic History   Marital status: Married    Spouse name: Not on file   Number of children: 3   Years of education: Not on file   Highest education level: Not on file   Occupational History   Not on file  Tobacco Use   Smoking status: Former    Packs/day: 0.50    Years: 30.00    Total pack years: 15.00    Types: Cigarettes    Quit date: 03/23/2017    Years since quitting: 4.6   Smokeless tobacco: Never  Vaping Use   Vaping Use: Former  Substance and Sexual Activity   Alcohol use: Yes    Comment: Occasional   Drug use: No   Sexual activity: Yes  Other Topics Concern   Not on file  Social History Narrative   Not on file   Social Determinants of Health   Financial Resource Strain: Not on file  Food Insecurity: Not on file  Transportation Needs: Not on file  Physical Activity: Not on file  Stress: Not on file  Social Connections: Not on file  Intimate Partner Violence: Not on file    Family History  Problem Relation Age of Onset   Hypertension Mother    Colon cancer Neg Hx    Esophageal cancer Neg Hx    Rectal cancer Neg Hx    Stomach cancer Neg Hx     ROS: no fevers or chills, productive cough, hemoptysis, dysphasia, odynophagia, melena, hematochezia, dysuria, hematuria, rash, seizure activity, orthopnea, PND, pedal edema, claudication. Remaining systems are negative.  Physical Exam: Well-developed well-nourished in no  acute distress.  Skin is warm and dry.  HEENT is normal.  Neck is supple.  Chest is clear to auscultation with normal expansion.  Cardiovascular exam is regular rate and rhythm.  Abdominal exam nontender or distended. No masses palpated. Extremities show no edema. neuro grossly intact  ECG- personally reviewed  A/P  1 palpitations-echocardiogram showed normal LV function.  Symptoms somewhat improved.  He is considering buying an Apple Watch and will forward strips to [place] to review if he has symptoms.  2 hyperlipidemia-continue statin.  Patient also with family history of coronary artery disease.  I will arrange a calcium score for risk stratification.  3 snoring-we will arrange a sleep study to rule out  sleep apnea.  4 elevated blood pressure reading-I have asked him to check his blood pressure at home.  Our goal systolic will be less than 130 and diastolic less than 85.  Olga Millers, MD

## 2021-11-29 ENCOUNTER — Encounter: Payer: Self-pay | Admitting: Cardiology

## 2021-11-29 ENCOUNTER — Ambulatory Visit: Payer: BC Managed Care – PPO | Attending: Cardiology | Admitting: Cardiology

## 2021-11-29 VITALS — BP 138/92 | HR 82 | Ht 75.0 in | Wt 206.4 lb

## 2021-11-29 DIAGNOSIS — R030 Elevated blood-pressure reading, without diagnosis of hypertension: Secondary | ICD-10-CM

## 2021-11-29 DIAGNOSIS — R002 Palpitations: Secondary | ICD-10-CM | POA: Diagnosis not present

## 2021-11-29 DIAGNOSIS — Z136 Encounter for screening for cardiovascular disorders: Secondary | ICD-10-CM | POA: Diagnosis not present

## 2021-11-29 DIAGNOSIS — R0683 Snoring: Secondary | ICD-10-CM

## 2021-11-29 NOTE — Patient Instructions (Signed)
  Testing/Procedures:  CORONARY CALCIUM SCORING CT SCAN AT THE [facility]  ITIMAR SLEEP STUDY    Follow-Up: At [facility], you and your health needs are our priority.  As part of our continuing mission to provide you with exceptional heart care, we have created designated Provider Care Teams.  These Care Teams include your primary Cardiologist (physician) and Advanced Practice Providers (APPs -  Physician Assistants and Nurse Practitioners) who all work together to provide you with the care you need, when you need it.  We recommend signing up for the patient portal called "MyChart".  Sign up information is provided on this After Visit Summary.  MyChart is used to connect with patients for Virtual Visits (Telemedicine).  Patients are able to view lab/test results, encounter notes, upcoming appointments, etc.  Non-urgent messages can be sent to your provider as well.   To learn more about what you can do with MyChart, go to [email or web address].    Your next appointment:   12 month(s)  The format for your next appointment:   In Person  Provider:   Kirk Ruths MD

## 2021-12-02 ENCOUNTER — Telehealth: Payer: Self-pay

## 2021-12-02 ENCOUNTER — Telehealth: Payer: Self-pay | Admitting: *Deleted

## 2021-12-02 NOTE — Telephone Encounter (Signed)
Called and made the patient aware that He may proceed with the Itamar Home Sleep Study. PIN # provided to the patient. Patient made aware that He will be contacted after the test has been read with the results and any recommendations. Patient verbalized understanding and thanked me for the call.   

## 2021-12-02 NOTE — Telephone Encounter (Signed)
Per [employer] [place] no PA is required for itamar. Call refer # [id number]. Debria Garret notified ok to activate device.

## 2021-12-07 ENCOUNTER — Ambulatory Visit (HOSPITAL_BASED_OUTPATIENT_CLINIC_OR_DEPARTMENT_OTHER)
Admission: RE | Admit: 2021-12-07 | Discharge: 2021-12-07 | Disposition: A | Discharge status: Home / Self Care | Payer: BC Managed Care – PPO | Source: Ambulatory Visit | Attending: Cardiology | Admitting: Cardiology

## 2021-12-07 DIAGNOSIS — Z136 Encounter for screening for cardiovascular disorders: Secondary | ICD-10-CM | POA: Insufficient documentation

## 2021-12-10 ENCOUNTER — Encounter: Payer: Self-pay | Admitting: *Deleted

## 2022-01-31 ENCOUNTER — Other Ambulatory Visit: Payer: Self-pay | Admitting: Nurse Practitioner

## 2022-01-31 DIAGNOSIS — R7989 Other specified abnormal findings of blood chemistry: Secondary | ICD-10-CM

## 2022-02-07 ENCOUNTER — Telehealth: Payer: Self-pay

## 2022-02-07 NOTE — Telephone Encounter (Signed)
I called the patient to speak to him about the Itamar device, I LVm for him to return my call.

## 2022-02-24 ENCOUNTER — Telehealth: Payer: Self-pay

## 2022-02-24 NOTE — Telephone Encounter (Signed)
I called the patient concerning the Itamar device, unable to speak to the patient. I LVm for him to return my call

## 2022-03-04 ENCOUNTER — Other Ambulatory Visit: Payer: Self-pay | Admitting: Nurse Practitioner

## 2022-03-04 DIAGNOSIS — R7989 Other specified abnormal findings of blood chemistry: Secondary | ICD-10-CM

## 2022-03-05 IMAGING — CT CT ABD-PELV W/ CM
2 of 5 series · 16 of 46 positions shown, 18 images · IV contrast (omnipaque)
Comparison: 11/02/2019

CLINICAL DATA: Epigastric pain, history of pancreatitis

EXAM:
CT ABDOMEN AND PELVIS WITH CONTRAST
TECHNIQUE: Multidetector CT imaging of the abdomen and pelvis was performed
using the standard protocol following bolus administration of
intravenous contrast.
CONTRAST:  80mL OMNIPAQUE IOHEXOL 350 MG/ML SOLN

[Series 2: axial st · axial · 0.90mm/px · z∈[-344,+121]mm · 13 of 109 slices shown, 15 images]
[im 8/109  soft-tissue]
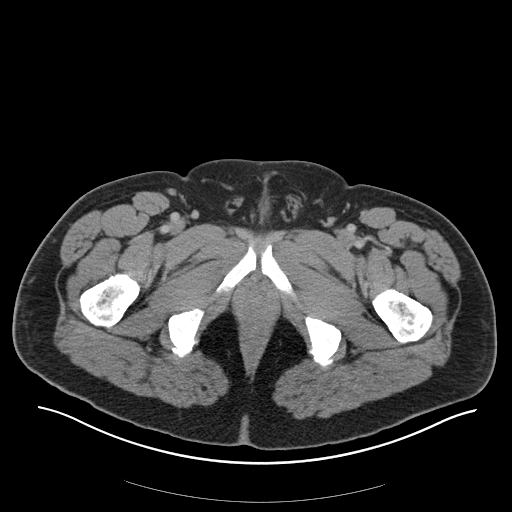
[im 8/109  bone]
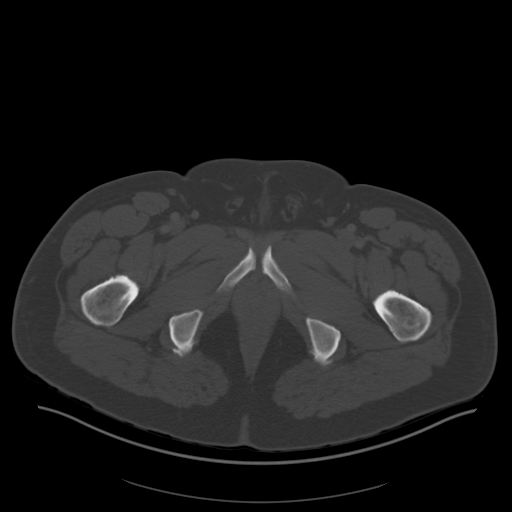
[im 16/109  soft-tissue]
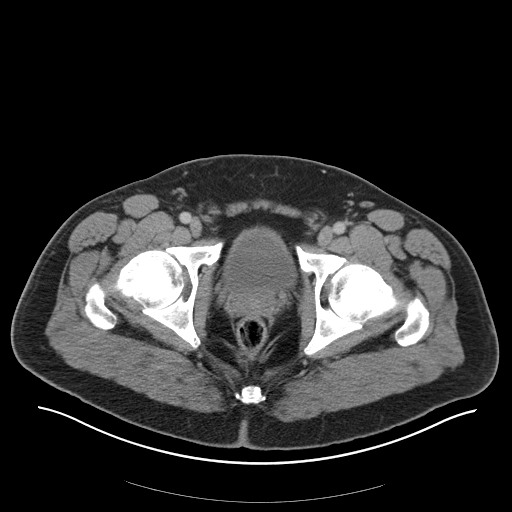
[im 24/109  soft-tissue]
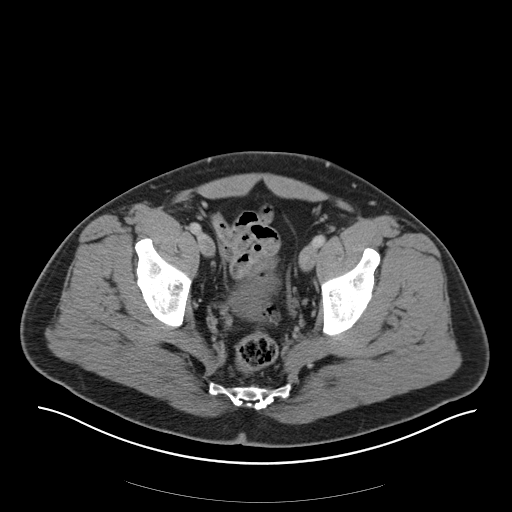
[im 31/109  soft-tissue]
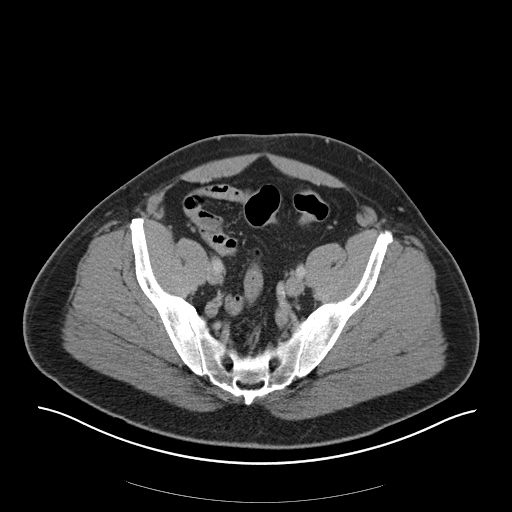
[im 39/109  soft-tissue]
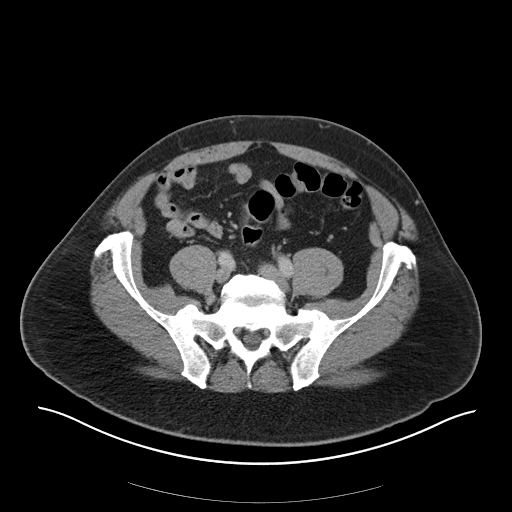
[im 47/109  soft-tissue]
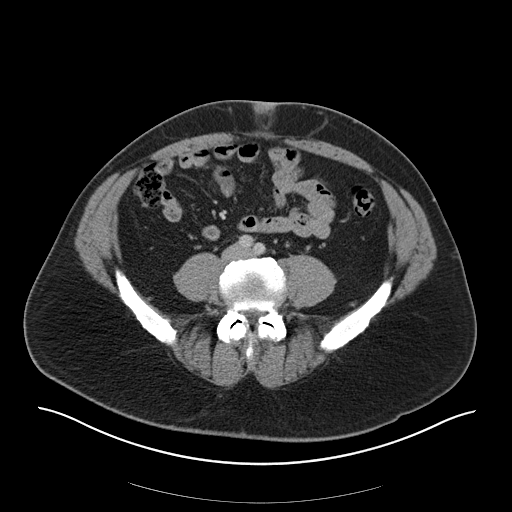
[im 55/109  soft-tissue]
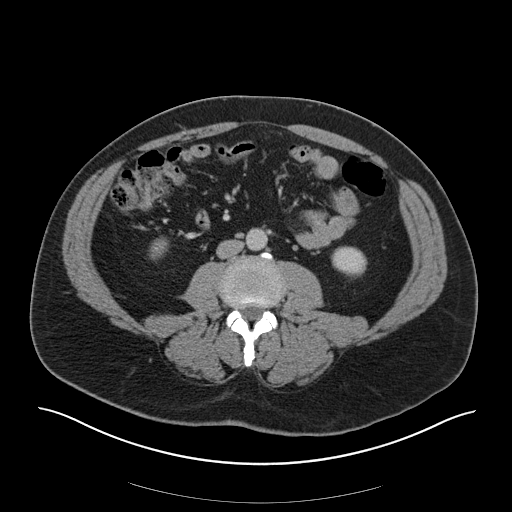
[im 62/109  soft-tissue]
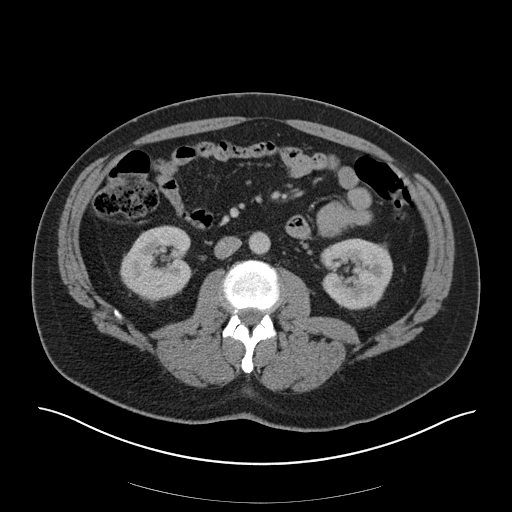
[im 70/109  soft-tissue]
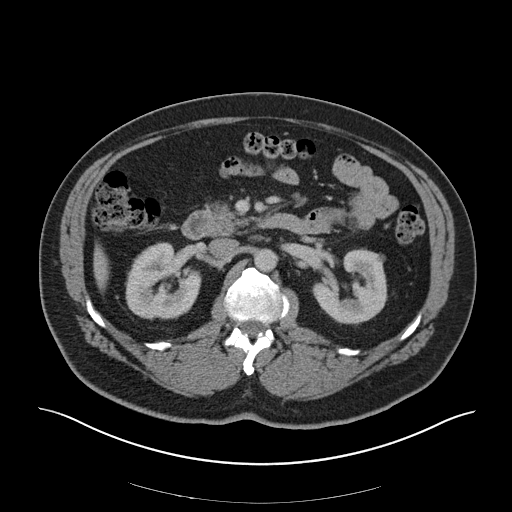
[im 70/109  bone]
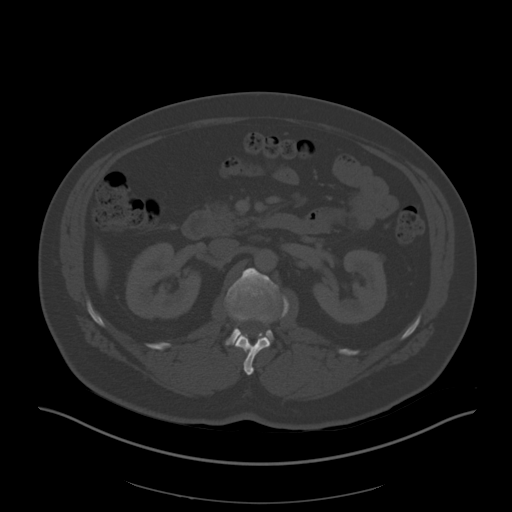
[im 78/109  soft-tissue]
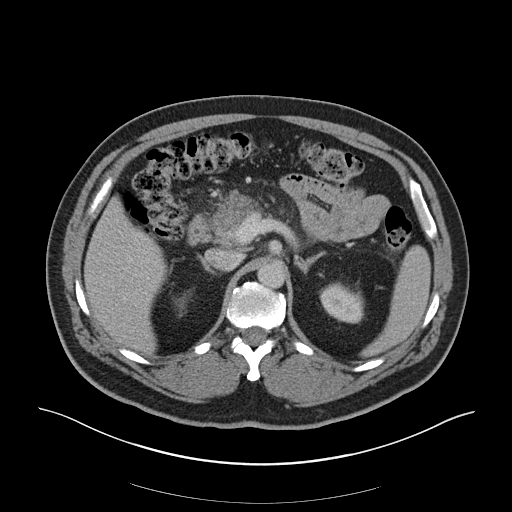
[im 85/109  soft-tissue]
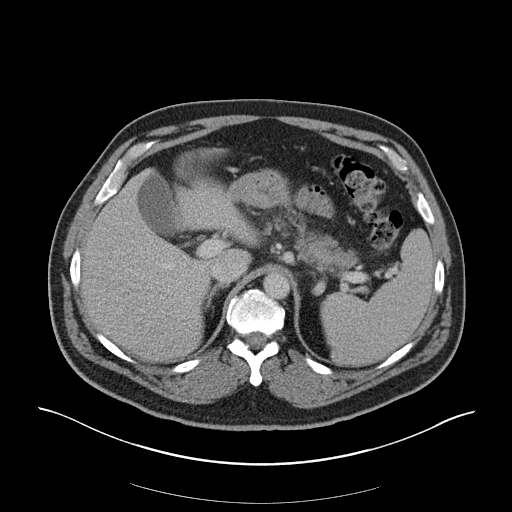
[im 93/109  soft-tissue]
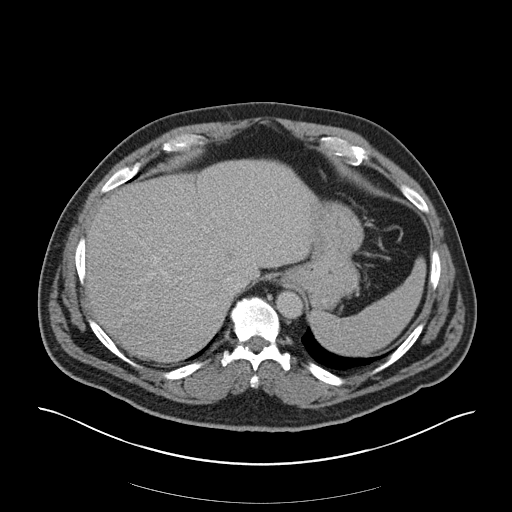
[im 101/109  soft-tissue]
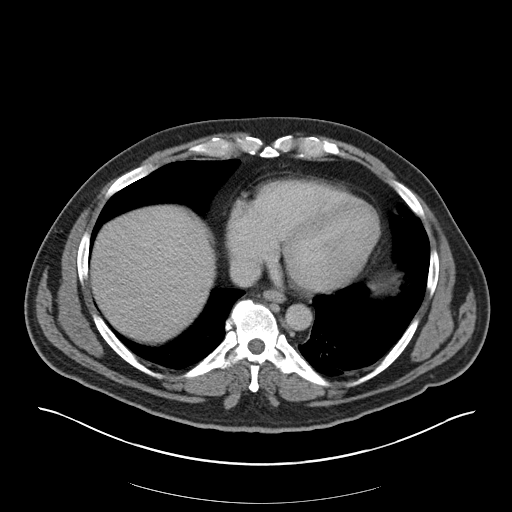

[Series 4: coronal st · coronal · 0.89mm/px · 3 of 156 slices shown]
[im 52/156  soft-tissue]
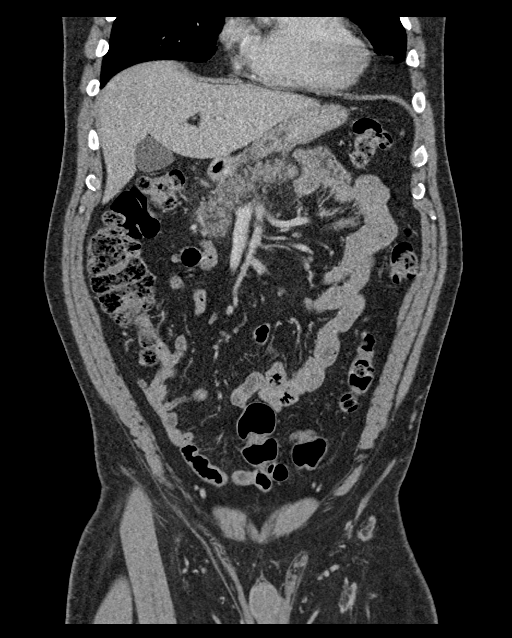
[im 69/156  soft-tissue]
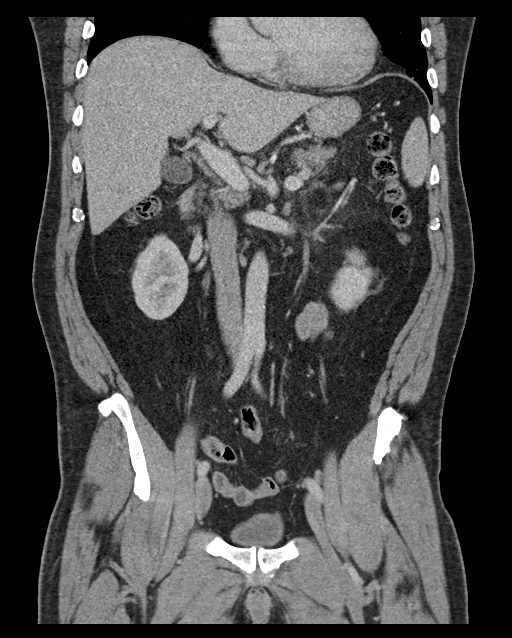
[im 87/156  soft-tissue]
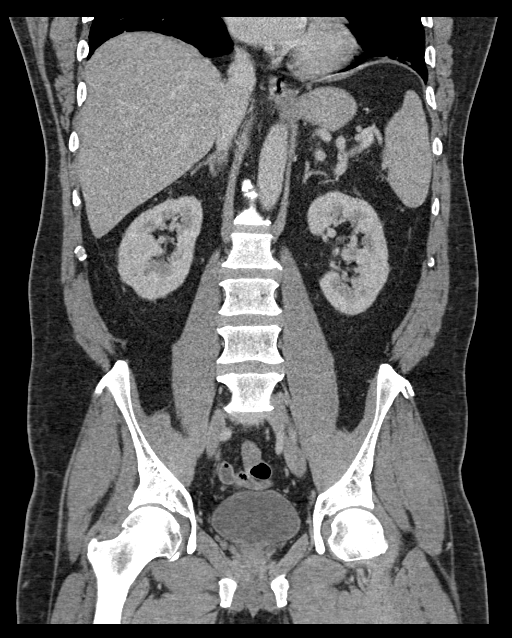

[16 of 46 positions shown; findings below may reference images not displayed]

FINDINGS: Lower chest: No acute pleural or parenchymal lung disease.

Hepatobiliary: No focal liver abnormality is seen. No gallstones,
gallbladder wall thickening, or biliary dilatation.

Pancreas: Mild peripancreatic fat stranding consistent with acute
uncomplicated pancreatitis. Pancreatic parenchyma enhances normally.
No fluid collection, pseudocyst, or abscess.

Spleen: Normal in size without focal abnormality.

Adrenals/Urinary Tract: Adrenal glands are unremarkable. Kidneys are
normal, without renal calculi, focal lesion, or hydronephrosis.
Bladder is unremarkable.

Stomach/Bowel: No bowel obstruction or ileus. Normal appendix right
lower quadrant. No bowel wall thickening or inflammatory change.

Vascular/Lymphatic: No significant vascular findings are present. No
enlarged abdominal or pelvic lymph nodes.

Reproductive: Prostate is unremarkable.

Other: No free fluid or free gas. There is a small fat containing
umbilical hernia.

Musculoskeletal: No acute or destructive bony lesions. Reconstructed
images demonstrate no additional findings.
IMPRESSION: 1. Mild acute uncomplicated pancreatitis. No fluid collection,
pseudocyst, or abscess.
2. Stable fat containing umbilical hernia.

## 2022-03-17 ENCOUNTER — Encounter (INDEPENDENT_AMBULATORY_CARE_PROVIDER_SITE_OTHER): Payer: BC Managed Care – PPO | Admitting: Cardiology

## 2022-03-17 DIAGNOSIS — G4733 Obstructive sleep apnea (adult) (pediatric): Secondary | ICD-10-CM

## 2022-03-18 ENCOUNTER — Ambulatory Visit: Payer: BC Managed Care – PPO | Attending: Cardiology

## 2022-03-18 ENCOUNTER — Other Ambulatory Visit: Payer: Self-pay | Admitting: Cardiology

## 2022-03-18 ENCOUNTER — Telehealth: Payer: Self-pay | Admitting: Cardiology

## 2022-03-18 DIAGNOSIS — G4733 Obstructive sleep apnea (adult) (pediatric): Secondary | ICD-10-CM

## 2022-03-18 DIAGNOSIS — R030 Elevated blood-pressure reading, without diagnosis of hypertension: Secondary | ICD-10-CM

## 2022-03-18 DIAGNOSIS — G4736 Sleep related hypoventilation in conditions classified elsewhere: Secondary | ICD-10-CM

## 2022-03-18 NOTE — Procedures (Signed)
SLEEP STUDY REPORT Patient Information Study Date: 03/17/2022 Patient Name: Taylor Kidd Patient ID: [id number] Birth Date: 1970-10-30 Age: 52 Gender: Male BMI: 26.0 (W=207 lb, H=6' 3'') Referring Physician: Kirk Ruths, MD  TEST DESCRIPTION:  Home sleep apnea testing was completed using the WatchPat, a Type 1 device, utilizing peripheral arterial tonometry (PAT), chest movement, actigraphy, pulse oximetry, pulse rate, body position and snore.  AHI was calculated with apnea and hypopnea using valid sleep time as the denominator. RDI includes apneas, hypopneas, and RERAs.  The data acquired and the scoring of sleep and all associated events were performed in accordance with the recommended standards and specifications as outlined in the AASM Manual for the Scoring of Sleep and Associated Events 2.2.0 (2015).  FINDINGS:  1.  Moderate Obstructive Sleep Apnea with AHI 16.8/hr.   2.  No  Central Sleep Apnea with pAHIc 0.4/hr.  3.  Oxygen desaturations as low as 72%.  4.  Mild to moderate snoring was present. O2 sats were < 88% for 11.7 min.  5.  Total sleep time was 8 hrs and 12 min.  6.  34.7% of total sleep time was spent in REM sleep.   7.  Normal sleep onset latency at 22 min  8.  Shortened REM sleep onset latency at 66 min.   9.  Total awakenings were 5.  10. Arrhythmia detection:  None  DIAGNOSIS:   Moderate Obstructive Sleep Apnea (G47.33) Nocturnal Hypoxemia  RECOMMENDATIONS:   1.  Clinical correlation of these findings is necessary.  The decision to treat obstructive sleep apnea (OSA) is usually based on the presence of apnea symptoms or the presence of associated medical conditions such as Hypertension, Congestive Heart Failure, Atrial Fibrillation or Obesity.  The most common symptoms of OSA are snoring, gasping for breath while sleeping, daytime sleepiness and fatigue.   2.  Initiating apnea therapy is recommended given the presence of symptoms and/or associated  conditions. Recommend proceeding with one of the following:     a.  Auto-CPAP therapy with a pressure range of 5-20cm H2O.     b.  An oral appliance (OA) that can be obtained from certain dentists with expertise in sleep medicine.  These are primarily of use in non-obese patients with mild and moderate disease.     c.  An ENT consultation which may be useful to look for specific causes of obstruction and possible treatment options.     d.  If patient is intolerant to PAP therapy, consider referral to ENT for evaluation for hypoglossal nerve stimulator.   3.  Close follow-up is necessary to ensure success with CPAP or oral appliance therapy for maximum benefit.  4.  A follow-up oximetry study on CPAP is recommended to assess the adequacy of therapy and determine the need for supplemental oxygen or the potential need for Bi-level therapy.  An arterial blood gas to determine the adequacy of baseline ventilation and oxygenation should also be considered.  5.  Healthy sleep recommendations include:  adequate nightly sleep (normal 7-9 hrs/night), avoidance of caffeine after noon and alcohol near bedtime, and maintaining a sleep environment that is cool, dark and quiet.  6.  Weight loss for overweight patients is recommended.  Even modest amounts of weight loss can significantly improve the severity of sleep apnea.  7.  Snoring recommendations include:  weight loss where appropriate, side sleeping, and avoidance of alcohol before bed.  8.  Operation of motor vehicle should not be performed  when sleepy.  Signature:   Fransico Him, MD; [facility]; Diplomat, [place] Board of Sleep Medicine

## 2022-03-18 NOTE — Telephone Encounter (Signed)
-----  Message from Sueanne Margarita, MD sent at 03/18/2022  8:18 AM EST ----- Please let patient know that they have sleep apnea.  Recommend therapeutic CPAP titration for treatment of patient's sleep disordered breathing.  If unable to perform an in lab titration then initiate ResMed auto CPAP from 4 to 15cm H2O with heated humidity and mask of choice and overnight pulse ox on CPAP.

## 2022-03-18 NOTE — Telephone Encounter (Signed)
Patient given sleep study results and recommendations. He  agrees to CPAP titration study pending insurance approval.

## 2022-03-18 NOTE — Telephone Encounter (Signed)
Pt states he completed his itamar sleep study last night and he is requesting a c/b on what to do next.

## 2022-03-21 ENCOUNTER — Telehealth: Payer: Self-pay | Admitting: *Deleted

## 2022-03-21 NOTE — Telephone Encounter (Signed)
Prior Authorization for CPAP titration sent to [facility] [place] via web portal. Per web portal no PA is required.

## 2022-03-22 ENCOUNTER — Other Ambulatory Visit: Payer: Self-pay | Admitting: Nurse Practitioner

## 2022-03-22 DIAGNOSIS — R7989 Other specified abnormal findings of blood chemistry: Secondary | ICD-10-CM

## 2022-03-22 NOTE — Telephone Encounter (Signed)
Patient has request refill on medication Testosterone 30mg . Patient last refill dated 03/04/2022. Patient has Non Opioid Contract on file dated 04/01/2020. Patient next appointment scheduled for 05/06/2022. Update Contract added to patient appointment notes. Medication pend and sent to PCP Taylor Oats Carlos American, NP for approval.

## 2022-04-12 ENCOUNTER — Ambulatory Visit (HOSPITAL_BASED_OUTPATIENT_CLINIC_OR_DEPARTMENT_OTHER): Payer: BC Managed Care – PPO | Attending: Cardiology | Admitting: Cardiology

## 2022-04-12 VITALS — Ht 75.0 in | Wt 260.0 lb

## 2022-04-12 DIAGNOSIS — G4733 Obstructive sleep apnea (adult) (pediatric): Secondary | ICD-10-CM

## 2022-04-12 DIAGNOSIS — G4736 Sleep related hypoventilation in conditions classified elsewhere: Secondary | ICD-10-CM | POA: Diagnosis present

## 2022-04-13 NOTE — Procedures (Signed)
   Patient Name: Taylor Kidd, Taylor Kidd Date: 04/12/2022 Gender: Male D.O.B: October 14, 1970 Age (years): 74 Referring Provider: Fransico Him MD, ABSM Height (inches): 75 Interpreting Physician: Fransico Him MD, ABSM Weight (lbs): 260 RPSGT: Carolin Coy BMI: 32 MRN: [id number] Neck Size: 18.00  CLINICAL INFORMATION The patient is referred for a CPAP titration to treat sleep apnea.  SLEEP STUDY TECHNIQUE As per the AASM Manual for the Scoring of Sleep and Associated Events v2.3 (April 2016) with a hypopnea requiring 4% desaturations.  The channels recorded and monitored were frontal, central and occipital EEG, electrooculogram (EOG), submentalis EMG (chin), nasal and oral airflow, thoracic and abdominal wall motion, anterior tibialis EMG, snore microphone, electrocardiogram, and pulse oximetry. Continuous positive airway pressure (CPAP) was initiated at the beginning of the study and titrated to treat sleep-disordered breathing.  MEDICATIONS Medications self-administered by patient taken the night of the study : N/A  TECHNICIAN COMMENTS Comments added by technician: PATIENT WAS ORDERED AS A CPAP TITRATION. Comments added by scorer: N/A  RESPIRATORY PARAMETERS Optimal PAP Pressure (cm): 15  AHI at Optimal Pressure (/hr):0 Overall Minimal O2 (%):83.0  Supine % at Optimal Pressure (%):100 Minimal O2 at Optimal Pressure (%): 93.0   SLEEP ARCHITECTURE The study was initiated at 10:27:29 PM and ended at 4:28:58 AM.  Sleep onset time was 16.9 minutes and the sleep efficiency was 78.0%. The total sleep time was 282 minutes.  The patient spent 11.0% of the night in stage N1 sleep, 75.2% in stage N2 sleep, 0.0% in stage N3 and 13.8% in REM.Stage REM latency was 135.0 minutes  Wake after sleep onset was 62.6. Alpha intrusion was absent. Supine sleep was 100.00%.  CARDIAC DATA The 2 lead EKG demonstrated sinus rhythm. The mean heart rate was 74.9 beats per minute. Other EKG findings  include: PVCs.  LEG MOVEMENT DATA The total Periodic Limb Movements of Sleep (PLMS) were 0. The PLMS index was 0.0. A PLMS index of <15 is considered normal in adults.  IMPRESSIONS - The optimal PAP pressure was 15 cm of water. - Central sleep apnea was not noted during this titration (CAI = 0.4/h). - Moderate oxygen desaturations were observed during this titration (min O2 = 83.0%). - The patient snored with soft snoring volume during this titration study. - PVCswere observed during this study. - Clinically significant periodic limb movements were not noted during this study. Arousals associated with PLMs were rare.  DIAGNOSIS - Obstructive Sleep Apnea (G47.33)  RECOMMENDATIONS - Trial of ResMed CPAP therapy on 15 cm H2O with a Large size Fisher&Paykel Full Face Simplus mask and heated humidification. - Avoid alcohol, sedatives and other CNS depressants that may worsen sleep apnea and disrupt normal sleep architecture. - Sleep hygiene should be reviewed to assess factors that may improve sleep quality. - Weight management and regular exercise should be initiated or continued. - Return to [facility] for re-evaluation after 4 weeks of therapy  [Electronically signed] 04/13/2022 03:55 PM  Fransico Him MD, ABSM Diplomate, American Board of Sleep Medicine

## 2022-04-14 ENCOUNTER — Telehealth: Payer: Self-pay | Admitting: *Deleted

## 2022-04-14 NOTE — Telephone Encounter (Signed)
The patient has been notified of the result and verbalized understanding.  All questions (if any) were answered. Marolyn Hammock, [place] 04/14/2022 6:37 PM    Upon patient request DME selection is [facility]. Patient understands he will be contacted by [facility] to set up his cpap. Patient understands to call if [facility] does not contact him with new setup in a timely manner. Patient understands they will be called once confirmation has been received from [employer]/ that they have received their new machine to schedule 10 week follow up appointment.   [facility] notified of new cpap order  Please add to airview Patient was grateful for the call and thanked me.

## 2022-04-14 NOTE — Telephone Encounter (Signed)
-----   Message from Lauralee Evener, [place] sent at 04/14/2022 11:42 AM EST -----  ----- Message ----- From: Sueanne Margarita, MD Sent: 04/13/2022   3:57 PM EST To: [facility] Sleep Studies  Please let patient know that they had a successful PAP titration and let DME know that orders are in EPIC.  Please set up 6 week OV with me.

## 2022-04-30 ENCOUNTER — Other Ambulatory Visit: Payer: Self-pay | Admitting: Nurse Practitioner

## 2022-05-06 ENCOUNTER — Encounter: Payer: BC Managed Care – PPO | Admitting: Nurse Practitioner

## 2022-05-06 NOTE — Progress Notes (Signed)
Err

## 2022-05-08 ENCOUNTER — Other Ambulatory Visit: Payer: Self-pay | Admitting: Nurse Practitioner

## 2022-05-08 DIAGNOSIS — R7989 Other specified abnormal findings of blood chemistry: Secondary | ICD-10-CM

## 2022-05-13 ENCOUNTER — Ambulatory Visit: Payer: BC Managed Care – PPO | Admitting: Nurse Practitioner

## 2022-05-27 ENCOUNTER — Ambulatory Visit: Payer: BC Managed Care – PPO | Admitting: Nurse Practitioner

## 2022-09-30 ENCOUNTER — Other Ambulatory Visit: Payer: Self-pay | Admitting: Gastroenterology

## 2022-09-30 DIAGNOSIS — R1013 Epigastric pain: Secondary | ICD-10-CM

## 2022-11-24 NOTE — Progress Notes (Deleted)
Cardiology Office Note:    Date:  11/24/2022   ID:  Taylor Kidd, DOB 10/02/1970, MRN [id number]  PCP:  Sharon Seller, NP  Cardiologist:  Olga Millers, MD  Electrophysiologist:  None   Referring MD: Sharon Seller, NP   Chief Complaint: follow-up of palpitations   History of Present Illness:    Taylor Kidd is a 52 y.o. male with a history of mild coronary artery calcifications with a coronary calcium score of 1 in 11/2021, palpitations, borderline dilatation of aortic root, hyperlipidemia, GERD, and anxiety/ depression who is followed by Dr. Jens Som and presents today for routine follow-up.  Patient was referred to Dr. Jens Som in 06/2021 for further evaluation of palpitations and pre-op evaluation for upcoming cholecystectomy. He reported intermittent palpitations for the last 5 years at that time lasting 15 seconds to 1 minute at a time. He was felt to be at acceptable risk for his surgery. Echo was ordered for further evaluation of palpitations and patient was advised to use a smart watch to help monitor palpitations. Echo showed LVEF of 54% with mild LVH but normal wall motion and diastolic parameters as well as borderline dilatation of aortic root measuring 41 mm. He was last seen by Dr. Jens Som in 11/2021 at which time he reported dyspnea with more vigorous activity and occasional brief palpitations but was overall doing well. Coronary calcium score was ordered to help assist with risk stratification given hyperlipidemia and family history of CAD. Sleep study was also ordered given snoring. CT cardiac scoring showed a coronary calcium score of 1 (56 percentile for age, race, and sex). Sleep study was positive for obstructive sleep apnea. He was started on CPAP which is being managed by Dr. Mayford Knife.   Patient presents today for routine follow-up. ***  Mild Coronary Artery Calcifications CT cardiac scoring in 11/2021 showed a coronary calcium score of 1 (56 percentile for  age, race, and sex). Calcification was located in the RCA. - No chest pain.  - No need for Aspirin at this time given only minimally elevated CAC.  - Continue statin.   Palpitations Stable. ***  Hyperlipidemia Lipid panel in 10/2021: Total Cholesterol 196, Triglycerides 176, HDL 57, LDL 110.  - Continue Lipitor 20mg  daily. - Labs followed by PCP. LDL goal ***  Obstructive Sleep Apnea - Continue CPAP.  - Followed by Dr. Mayford Knife.   EKGs/Labs/Other Studies Reviewed:    The following studies were reviewed:  Echocardiogram 08/09/2021: Impressions: 1. Left ventricular ejection fraction by 3D volume is 54 %. The left  ventricle has normal function. The left ventricle has no regional wall  motion abnormalities. There is mild left ventricular hypertrophy of the  septal segment. Left ventricular  diastolic parameters were normal. The average left ventricular global  longitudinal strain is -22.3 %. The global longitudinal strain is normal.   2. Right ventricular systolic function is normal. The right ventricular  size is normal. Tricuspid regurgitation signal is inadequate for assessing  PA pressure.   3. The mitral valve is normal in structure. Trivial mitral valve  regurgitation. No evidence of mitral stenosis.   4. The aortic valve is tricuspid. There is mild calcification of the  aortic valve. Aortic valve regurgitation is not visualized. No aortic  stenosis is present.   5. Aortic dilatation noted. There is borderline dilatation, when indexed  to BSA, of the aortic root, measuring 41 mm.   6. The inferior vena cava is normal in size with greater than 50%  respiratory  variability, suggesting right atrial pressure of 3 mmHg.  _______________  CT Cardiac Scoring 12/07/2021: Impressions: 1. Coronary calcium score of 1. This was 63 percentile for age-, race-, and sex-matched controls. 2.  Mild focal aortic arch atherosclerosis.   EKG:  EKG ordered today. EKG personally reviewed  and demonstrates ***.  Recent Labs: No results found for requested labs within last 365 days.  Recent Lipid Panel    Component Value Date/Time   CHOL 196 11/01/2021 0937   CHOL 168 10/14/2014 0940   TRIG 176 (H) 11/01/2021 0937   HDL 57 11/01/2021 0937   HDL 52 10/14/2014 0940   CHOLHDL 3.4 11/01/2021 0937   VLDL 13 02/15/2019 0500   LDLCALC 110 (H) 11/01/2021 0937    Physical Exam:    Vital Signs: There were no vitals taken for this visit.    Wt Readings from Last 3 Encounters:  04/12/22 260 lb (117.9 kg)  11/29/21 206 lb 6.4 oz (93.6 kg)  11/01/21 259 lb (117.5 kg)     General: 52 y.o. male in no acute distress. HEENT: Normocephalic and atraumatic. Sclera clear. EOMs intact. Neck: Supple. No carotid bruits. No JVD. Heart: *** RRR. Distinct S1 and S2. No murmurs, gallops, or rubs. Radial and distal pedal pulses 2+ and equal bilaterally. Lungs: No increased work of breathing. Clear to ausculation bilaterally. No wheezes, rhonchi, or rales.  Abdomen: Soft, non-distended, and non-tender to palpation. Bowel sounds present in all 4 quadrants.  MSK: Normal strength and tone for age. *** Extremities: No lower extremity edema.    Skin: Warm and dry. Neuro: Alert and oriented x3. No focal deficits. Psych: Normal affect. Responds appropriately.   Assessment:    No diagnosis found.  Plan:     Disposition: Follow up in ***   Medication Adjustments/Labs and Tests Ordered: Current medicines are reviewed at length with the patient today.  Concerns regarding medicines are outlined above.  No orders of the defined types were placed in this encounter.  No orders of the defined types were placed in this encounter.   There are no Patient Instructions on file for this visit.   Signed, Corrin Parker, PA-C  11/24/2022 8:52 PM    [facility]

## 2022-12-02 ENCOUNTER — Ambulatory Visit: Payer: BC Managed Care – PPO | Admitting: Student

## 2022-12-22 NOTE — Progress Notes (Unsigned)
Cardiology Office Note:    Date:  12/22/2022   ID:  Taylor Kidd, DOB 25-Jan-1971, MRN [id number]  PCP:  Sharon Seller, NP  Cardiologist:  Olga Millers, MD { Click to update primary MD,subspecialty MD or APP then REFRESH:1}    Referring MD: Sharon Seller, NP   Chief Complaint: follow-up of palpitations   History of Present Illness:    Taylor Kidd is a 52 y.o. male with a history of mild coronary artery calcifications with a coronary calcium score of 1 in 11/2021, palpitations, borderline dilatation of aortic root, hyperlipidemia, GERD, and anxiety/ depression who is followed by Dr. Jens Som and presents today for routine follow-up.  Patient was referred to Dr. Jens Som in 06/2021 for further evaluation of palpitations and pre-op evaluation for upcoming cholecystectomy. He reported intermittent palpitations for the last 5 years at that time lasting 15 seconds to 1 minute at a time. He was felt to be at acceptable risk for his surgery. Echo was ordered for further evaluation of palpitations and patient was advised to use a smart watch to help monitor palpitations. Echo showed LVEF of 54% with mild LVH but normal wall motion and diastolic parameters as well as borderline dilatation of aortic root measuring 41 mm. He was last seen by Dr. Jens Som in 11/2021 at which time he reported dyspnea with more vigorous activity and occasional brief palpitations but was overall doing well. Coronary calcium score was ordered to help assist with risk stratification given hyperlipidemia and family history of CAD. Sleep study was also ordered given snoring. CT cardiac scoring showed a coronary calcium score of 1 (56 percentile for age, race, and sex). Sleep study was positive for obstructive sleep apnea. He was started on CPAP which is being managed by Dr. Mayford Knife.   Patient presents today for routine follow-up. ***  Mild Coronary Artery Calcifications CT cardiac scoring in 11/2021 showed a  coronary calcium score of 1 (56 percentile for age, race, and sex). Calcification was located in the RCA. - No chest pain.  - No need for Aspirin at this time given only minimally elevated CAC.  - Continue statin.   Palpitations Stable. ***  Hyperlipidemia Lipid panel in 10/2021: Total Cholesterol 196, Triglycerides 176, HDL 57, LDL 110.  - Continue Lipitor 20mg  daily. - Labs followed by PCP. LDL goal ***  Obstructive Sleep Apnea - Continue CPAP.  - Followed by Dr. Mayford Knife.   EKGs/Labs/Other Studies Reviewed:    The following studies were reviewed:  Echocardiogram 08/09/2021: Impressions: 1. Left ventricular ejection fraction by 3D volume is 54 %. The left  ventricle has normal function. The left ventricle has no regional wall  motion abnormalities. There is mild left ventricular hypertrophy of the  septal segment. Left ventricular  diastolic parameters were normal. The average left ventricular global  longitudinal strain is -22.3 %. The global longitudinal strain is normal.   2. Right ventricular systolic function is normal. The right ventricular  size is normal. Tricuspid regurgitation signal is inadequate for assessing  PA pressure.   3. The mitral valve is normal in structure. Trivial mitral valve  regurgitation. No evidence of mitral stenosis.   4. The aortic valve is tricuspid. There is mild calcification of the  aortic valve. Aortic valve regurgitation is not visualized. No aortic  stenosis is present.   5. Aortic dilatation noted. There is borderline dilatation, when indexed  to BSA, of the aortic root, measuring 41 mm.   6. The inferior vena cava is normal  in size with greater than 50%  respiratory variability, suggesting right atrial pressure of 3 mmHg.  _______________   CT Cardiac Scoring 12/07/2021: Impressions: 1. Coronary calcium score of 1. This was 68 percentile for age-, race-, and sex-matched controls. 2.  Mild focal aortic arch atherosclerosis.       EKG:  EKG ordered today. EKG personally reviewed and demonstrates ***.  Recent Labs: No results found for requested labs within last 365 days.  Recent Lipid Panel    Component Value Date/Time   CHOL 196 11/01/2021 0937   CHOL 168 10/14/2014 0940   TRIG 176 (H) 11/01/2021 0937   HDL 57 11/01/2021 0937   HDL 52 10/14/2014 0940   CHOLHDL 3.4 11/01/2021 0937   VLDL 13 02/15/2019 0500   LDLCALC 110 (H) 11/01/2021 0937    Physical Exam:    Vital Signs: There were no vitals taken for this visit.    Wt Readings from Last 3 Encounters:  04/12/22 260 lb (117.9 kg)  11/29/21 206 lb 6.4 oz (93.6 kg)  11/01/21 259 lb (117.5 kg)     General: 52 y.o. male in no acute distress. HEENT: Normocephalic and atraumatic. Sclera clear.  Neck: Supple. No carotid bruits. No JVD. Heart: *** RRR. Distinct S1 and S2. No murmurs, gallops, or rubs.  Lungs: No increased work of breathing. Clear to ausculation bilaterally. No wheezes, rhonchi, or rales.  Abdomen: Soft, non-distended, and non-tender to palpation.  Extremities: No lower extremity edema.  Radial and distal pedal pulses 2+ and equal bilaterally. Skin: Warm and dry. Neuro: No focal deficits. Psych: Normal affect. Responds appropriately.   Assessment:    No diagnosis found.  Plan:     Disposition: Follow up in ***   Signed, Corrin Parker, PA-C  12/22/2022 2:18 PM    [facility]

## 2023-01-03 ENCOUNTER — Encounter: Payer: Self-pay | Admitting: Student

## 2023-01-03 ENCOUNTER — Ambulatory Visit: Payer: BC Managed Care – PPO | Attending: Student | Admitting: Student

## 2023-01-03 VITALS — BP 130/80 | HR 83 | Ht 75.0 in | Wt 255.8 lb

## 2023-01-03 DIAGNOSIS — I471 Supraventricular tachycardia, unspecified: Secondary | ICD-10-CM

## 2023-01-03 DIAGNOSIS — E785 Hyperlipidemia, unspecified: Secondary | ICD-10-CM | POA: Diagnosis not present

## 2023-01-03 DIAGNOSIS — I2510 Atherosclerotic heart disease of native coronary artery without angina pectoris: Secondary | ICD-10-CM | POA: Diagnosis not present

## 2023-01-03 DIAGNOSIS — G4733 Obstructive sleep apnea (adult) (pediatric): Secondary | ICD-10-CM

## 2023-01-03 DIAGNOSIS — R002 Palpitations: Secondary | ICD-10-CM | POA: Diagnosis not present

## 2023-01-03 MED ORDER — METOPROLOL SUCCINATE ER 25 MG PO TB24: 25.0000 mg | ORAL_TABLET | Freq: Every day | ORAL | 3 refills | Status: DC

## 2023-01-03 NOTE — Patient Instructions (Signed)
Medication Instructions:  Start Metoprolol 25 mg ( Take 1 Tablet Daily). *If you need a refill on your cardiac medications before your next appointment, please call your pharmacy*   Lab Work: CMET, Lipid Panel, Magnesium, TSH If you have labs (blood work) drawn today and your tests are completely normal, you will receive your results only by: MyChart Message (if you have MyChart) OR A paper copy in the mail If you have any lab test that is abnormal or we need to change your treatment, we will call you to review the results.   Testing/Procedures: No Testing   Follow-Up: At [facility], you and your health needs are our priority.  As part of our continuing mission to provide you with exceptional heart care, we have created designated Provider Care Teams.  These Care Teams include your primary Cardiologist (physician) and Advanced Practice Providers (APPs -  Physician Assistants and Nurse Practitioners) who all work together to provide you with the care you need, when you need it.  We recommend signing up for the patient portal called "MyChart".  Sign up information is provided on this After Visit Summary.  MyChart is used to connect with patients for Virtual Visits (Telemedicine).  Patients are able to view lab/test results, encounter notes, upcoming appointments, etc.  Non-urgent messages can be sent to your provider as well.   To learn more about what you can do with MyChart, go to [email or web address].    Your next appointment:   1 year(s)  Provider:   Olga Millers, MD

## 2023-01-06 LAB — LIPID PANEL
Chol/HDL Ratio: 4 ratio (ref 0.0–5.0)
Cholesterol, Total: 206 mg/dL — ABNORMAL HIGH (ref 100–199)
HDL: 52 mg/dL (ref >39)
LDL Chol Calc (NIH): 137 mg/dL — ABNORMAL HIGH (ref 0–99)
Triglycerides: 95 mg/dL (ref 0–149)
VLDL Cholesterol Cal: 17 mg/dL (ref 5–40)

## 2023-01-06 LAB — COMPREHENSIVE METABOLIC PANEL
ALT: 27 [IU]/L (ref 0–44)
AST: 26 [IU]/L (ref 0–40)
Albumin: 4.3 g/dL (ref 3.8–4.9)
Alkaline Phosphatase: 111 [IU]/L (ref 44–121)
BUN/Creatinine Ratio: 20 (ref 9–20)
BUN: 16 mg/dL (ref 6–24)
Bilirubin Total: 0.5 mg/dL (ref 0.0–1.2)
CO2: 23 mmol/L (ref 20–29)
Calcium: 9.8 mg/dL (ref 8.7–10.2)
Chloride: 101 mmol/L (ref 96–106)
Creatinine, Ser: 0.8 mg/dL (ref 0.76–1.27)
Globulin, Total: 2.6 g/dL (ref 1.5–4.5)
Glucose: 99 mg/dL (ref 70–99)
Potassium: 4.4 mmol/L (ref 3.5–5.2)
Sodium: 140 mmol/L (ref 134–144)
Total Protein: 6.9 g/dL (ref 6.0–8.5)
eGFR: 106 mL/min/{1.73_m2} (ref >59)

## 2023-01-06 LAB — TSH: TSH: 0.758 u[IU]/mL (ref 0.450–4.500)

## 2023-01-06 LAB — MAGNESIUM: Magnesium: 2.3 mg/dL (ref 1.6–2.3)

## 2023-01-11 ENCOUNTER — Telehealth: Payer: Self-pay | Admitting: *Deleted

## 2023-01-11 NOTE — Telephone Encounter (Signed)
Left message to contact office to answer question about taking cholesterol

## 2023-01-11 NOTE — Telephone Encounter (Signed)
-----   Message from Corrin Parker sent at 01/06/2023  7:29 AM EST ----- Please call and notify patient of results. Cholesterol is elevated. LDL (bad cholesterol) is 137 which is actually higher than it was last year. Otherwise, labs look good.  Can we confirm that he is taking his Lipitor 20mg  every day? If not, he should start taking this every day? If he is taking his Lipitor, then I would increase it to 40mg  daily. He will then need a repeat lipid panel and LFTs in 2-3 months.   Thank you!

## 2023-01-16 ENCOUNTER — Other Ambulatory Visit: Payer: Self-pay

## 2023-01-16 DIAGNOSIS — I2510 Atherosclerotic heart disease of native coronary artery without angina pectoris: Secondary | ICD-10-CM

## 2023-01-16 DIAGNOSIS — E785 Hyperlipidemia, unspecified: Secondary | ICD-10-CM

## 2023-03-18 ENCOUNTER — Telehealth: Payer: Self-pay | Admitting: Cardiology

## 2023-03-18 NOTE — Telephone Encounter (Signed)
Patient called into the answering service stating that he developed episodes of tachycardia and palpitations around 810 this morning with heart rate on his Apple Watch being 186 bpm.  Reports episode lasted off and on until around 9 AM whenever heart rate dropped to 108 bpm.  Unfortunately he was driving at that time, able to come home and take his Toprol XL 25 mg dose and heart rate has since improved.  He reports he has not been compliant with taking his beta-blocker as he forgets frequently.  Advised he needs to remain compliant with beta-blocker dosing and will route information to provider he recently saw in the office as there was discussion about placing a formal monitor to quantify episodes.  He is currently without symptoms and feeling well therefore will defer referral to the ER for further evaluation at this time. He will monitor symptoms. Thanked me for callback.

## 2023-03-20 ENCOUNTER — Ambulatory Visit: Payer: BC Managed Care – PPO | Attending: Cardiology | Admitting: Cardiology

## 2023-03-20 ENCOUNTER — Telehealth: Payer: Self-pay | Admitting: Cardiology

## 2023-03-20 ENCOUNTER — Encounter: Payer: Self-pay | Admitting: Cardiology

## 2023-03-20 VITALS — BP 112/84 | HR 77 | Ht 75.0 in | Wt 258.0 lb

## 2023-03-20 DIAGNOSIS — I471 Supraventricular tachycardia, unspecified: Secondary | ICD-10-CM | POA: Diagnosis not present

## 2023-03-20 NOTE — Telephone Encounter (Signed)
Thanks Lillia Abed! Looks like he saw Dr. [place] today in the office.

## 2023-03-20 NOTE — Telephone Encounter (Signed)
Called and spoke to patient. Verified name and DOB. Patient reports on Saturday he had increased heart rate. He stated he was driving to work and started felling dizzy and having palpitations. He stated his heart rate got as high as 211. He was also clammy, lightheaded and had pain in the back of his neck. Patient report the episode lasted about 40 minutes. He did call the on call provider (see note dated 1/25). He report he had not been taking his Metoprolol as instructed but did start back taking it on Saturday. Patient said he is still having dizziness when he stands and having pain in his neck. Appointment with Dr Swaz[place] today at 1:20. Patient was advised per ED precautions and verbalized understanding and agree.   HR on Saturday 168, 209, 195, 211, 202, 196, after he took his meds 120, 105

## 2023-03-20 NOTE — Progress Notes (Signed)
Cardiology Office Note:  .   Date:  03/20/2023  ID:  Taylor Kidd, DOB 1970/06/17, MRN [id number] PCP: Taylor Seller, NP  [facility] Providers Cardiologist:  Taylor Millers, MD    History of Present Illness: .   Taylor Kidd is a 53 y.o. male seen for evaluation of tachycardia. He is followed by Dr Taylor Kidd. He reported intermittent palpitations for the last 5 years at that time lasting 15 seconds to 1 minute at a time.  Echo was ordered for further evaluation of palpitations and patient was advised to use a smart watch to help monitor palpitations. Echo showed LVEF of 54% with mild LVH but normal wall motion and diastolic parameters as well as borderline dilatation of aortic root measuring 41 mm. CT cardiac scoring showed a coronary calcium score of 1 (56th percentile for age, race, and sex). Sleep study was positive for obstructive sleep apnea. He was started on CPAP which is being managed by Dr. Mayford Kidd. He was last seen by Taylor Skiff PA-C in November. He continued to have occasional very brief palpitations where is heart rate will increase to the low 200s. He states he can feel this coming on and has caught a few of these episodes on his Apple Watch. These episodes typically only last 3-5 seconds and resolve with taking a few deep breaths. These episodes usually only occur once every 1-2 months. Reviewed EKG from Apple Watch and it looks like SVT. He was started on Toprol XL 25 mg daily.   The patient reports that 2 days ago he was driving when he felt his heart fluttering. His Apple watch showed SVT with HR up to 200. It was unusual in that it lasted about an hour. He was not taking Toprol xl regularly. Thinks he had more caffeine that day. Felt dizzy/foggy and legs were weak.     ROS: as per HPI  Studies Reviewed: Marland Kitchen   EKG Interpretation Date/Time:  Monday March 20 2023 13:26:46 EST Ventricular Rate:  77 PR Interval:  150 QRS Duration:  104 QT Interval:  362 QTC  Calculation: 409 R Axis:   42  Text Interpretation: Normal sinus rhythm Normal ECG When compared with ECG of 03-Jan-2023 08:56, No significant change was found Confirmed by Taylor Kidd, Taylor Kidd [phone]) on 03/20/2023 1:29:34 PM    EKG Interpretation Date/Time:  Monday March 20 2023 13:26:46 EST Ventricular Rate:  77 PR Interval:  150 QRS Duration:  104 QT Interval:  362 QTC Calculation: 409 R Axis:   42  Text Interpretation: Normal sinus rhythm Normal ECG When compared with ECG of 03-Jan-2023 08:56, No significant change was found Confirmed by Taylor Kidd, Taylor Kidd [phone]) on 03/20/2023 1:29:34 PM   Risk Assessment/Calculations:         STOP-Bang Score:         Physical Exam:   VS:  BP 112/84   Pulse 77   Ht 6\' 3"  (1.905 m)   Wt 258 lb (117 kg)   SpO2 95%   BMI 32.25 kg/m    Wt Readings from Last 3 Encounters:  03/20/23 258 lb (117 kg)  01/03/23 255 lb 12.8 oz (116 kg)  04/12/22 260 lb (117.9 kg)    GEN: Well nourished, well developed in no acute distress NECK: No JVD; No carotid bruits CARDIAC: RRR, no murmurs, rubs, gallops RESPIRATORY:  Clear to auscultation without rales, wheezing or rhonchi  ABDOMEN: Soft, non-tender, non-distended EXTREMITIES:  No edema; No deformity   ASSESSMENT AND PLAN: .  Paroxysmal SVT. I reviewed his Apple watch tracings. He has regular narrow complex tachycardia c/w SVT. Discussed diagnosis. Recommend he take Toprol XL daily and reduce caffeine intake. Reviewed Valsalva maneuvers. Will arrange f/u in 6 months. If he has frequent breakthrough despite medication could consider ablation.        Dispo: f/u 6 months with Dr Taylor Kidd  Signed, Taylor Dao [place], MD

## 2023-03-20 NOTE — Telephone Encounter (Signed)
STAT if HR is under 50 or over 120 (normal HR is 60-100 beats per minute)  What is your heart rate? 85  Do you have a log of your heart rate readings (document readings)? These readings are from Saturday: 168, 209, 195, 211, 202, 196, after he took his meds 120, 105  Do you have any other symptoms? Patient states he is dizzy.  He said Saturday he was having SOB when he HR was high. He states he called in on Saturday and spoke with Lillia Abed (see phone note).  He states he hasn't been taking his medication metoprolol succinate (TOPROL XL) 25 MG 24 hr tablet like he was supposed to.  He states he still not feeling right.

## 2023-03-20 NOTE — Patient Instructions (Addendum)
Medication Instructions:  Your physician recommends that you continue on your current medications as directed. Please refer to the Current Medication list given to you today.  *If you need a refill on your cardiac medications before your next appointment, please call your pharmacy*    Follow-Up: At [facility], you and your health needs are our priority.  As part of our continuing mission to provide you with exceptional heart care, we have created designated Provider Care Teams.  These Care Teams include your primary Cardiologist (physician) and Advanced Practice Providers (APPs -  Physician Assistants and Nurse Practitioners) who all work together to provide you with the care you need, when you need it.   Your next appointment:   6 month(s)  Provider:   Olga Millers, MD     Other Instructions The provider has recommended to avoid excessive intake of caffeine

## 2023-03-24 ENCOUNTER — Other Ambulatory Visit: Payer: Self-pay | Admitting: Gastroenterology

## 2023-03-24 DIAGNOSIS — R1013 Epigastric pain: Secondary | ICD-10-CM

## 2023-05-23 ENCOUNTER — Encounter: Payer: Self-pay | Admitting: Cardiology

## 2023-05-23 ENCOUNTER — Ambulatory Visit: Attending: Cardiology | Admitting: Cardiology

## 2023-05-23 ENCOUNTER — Telehealth: Payer: Self-pay | Admitting: Cardiology

## 2023-05-23 VITALS — BP 136/88 | HR 63 | Ht 75.0 in | Wt 256.2 lb

## 2023-05-23 DIAGNOSIS — I25118 Atherosclerotic heart disease of native coronary artery with other forms of angina pectoris: Secondary | ICD-10-CM

## 2023-05-23 DIAGNOSIS — I2510 Atherosclerotic heart disease of native coronary artery without angina pectoris: Secondary | ICD-10-CM

## 2023-05-23 DIAGNOSIS — I471 Supraventricular tachycardia, unspecified: Secondary | ICD-10-CM | POA: Diagnosis not present

## 2023-05-23 DIAGNOSIS — R002 Palpitations: Secondary | ICD-10-CM

## 2023-05-23 DIAGNOSIS — R030 Elevated blood-pressure reading, without diagnosis of hypertension: Secondary | ICD-10-CM

## 2023-05-23 DIAGNOSIS — I2089 Other forms of angina pectoris: Secondary | ICD-10-CM

## 2023-05-23 DIAGNOSIS — E785 Hyperlipidemia, unspecified: Secondary | ICD-10-CM | POA: Diagnosis not present

## 2023-05-23 LAB — BASIC METABOLIC PANEL WITH GFR
BUN/Creatinine Ratio: 16 (ref 9–20)
BUN: 14 mg/dL (ref 6–24)
CO2: 25 mmol/L (ref 20–29)
Calcium: 9.8 mg/dL (ref 8.7–10.2)
Chloride: 99 mmol/L (ref 96–106)
Creatinine, Ser: 0.86 mg/dL (ref 0.76–1.27)
Glucose: 88 mg/dL (ref 70–99)
Potassium: 4.9 mmol/L (ref 3.5–5.2)
Sodium: 136 mmol/L (ref 134–144)
eGFR: 104 mL/min/{1.73_m2} (ref >59)

## 2023-05-23 MED ORDER — ATORVASTATIN CALCIUM 20 MG PO TABS: 20.0000 mg | ORAL_TABLET | Freq: Every day | ORAL | 3 refills | Status: AC

## 2023-05-23 MED ORDER — METOPROLOL TARTRATE 50 MG PO TABS: ORAL_TABLET | ORAL | 0 refills | Status: AC

## 2023-05-23 NOTE — Telephone Encounter (Signed)
 Pt c/o BP issue: STAT if pt c/o blurred vision, one-sided weakness or slurred speech.  STAT if BP is GREATER than 180/120 TODAY.  STAT if BP is LESS than 90/60 and SYMPTOMATIC TODAY  1. What is your BP concern? Pt states this morning he felt dizzy and like his heart was about to start fluttering. He was at work and took his bp and noticed it was high, please advise.   2. Have you taken any BP medication today? No, he said he is not on any  3. What are your last 5 BP readings? 166/97 136/96   4. Are you having any other symptoms (ex. Dizziness, headache, blurred vision, passed out)?  Dizziness, palpitations

## 2023-05-23 NOTE — Patient Instructions (Signed)
 Medication Instructions:  Continue current medications *If you need a refill on your cardiac medications before your next appointment, please call your pharmacy*  Lab Work: bmp If you have labs (blood work) drawn today and your tests are completely normal, you will receive your results only by: MyChart Message (if you have MyChart) OR A paper copy in the mail If you have any lab test that is abnormal or we need to change your treatment, we will call you to review the results.  Testing/Procedures:   Your cardiac CT will be scheduled at one of the below locations:   [facility] [address] [place], [place] [address] [phone]     If scheduled at [facility], please arrive at the [facility] and Children's Entrance (Entrance C2) of [facility] 30 minutes prior to test start time. You can use the FREE valet parking offered at entrance C (encouraged to control the heart rate for the test)  Proceed to the [facility] Radiology Department (first floor) to check-in and test prep.  All radiology patients and guests should use entrance C2 at [facility], accessed from [facility], even though the hospital's physical address listed is [address].      Please follow these instructions carefully (unless otherwise directed):  An IV will be required for this test and Nitroglycerin will be given.  Hold all erectile dysfunction medications at least 3 days (72 hrs) prior to test. (Ie viagra, cialis, sildenafil, tadalafil, etc)   On the Night Before the Test: Be sure to Drink plenty of water. Do not consume any caffeinated/decaffeinated beverages or chocolate 12 hours prior to your test. Do not take any antihistamines 12 hours prior to your test. Drink plenty of water until 1 hour prior to the test. Do not eat any food 1 hour prior to test. You may take your regular medications prior to the test.  Take metoprolol  50mg   (Lopressor) two hours prior to test. If you take Furosemide/Hydrochlorothiazide/Spironolactone/Chlorthalidone, please HOLD on the morning of the test. Patients who wear a continuous glucose monitor MUST remove the device prior to scanning.    After the Test: Drink plenty of water. After receiving IV contrast, you may experience a mild flushed feeling. This is normal. On occasion, you may experience a mild rash up to 24 hours after the test. This is not dangerous. If this occurs, you can take Benadryl 25 mg, Zyrtec, Claritin, or Allegra and increase your fluid intake. (Patients taking Tikosyn should avoid Benadryl, and may take Zyrtec, Claritin, or Allegra) If you experience trouble breathing, this can be serious. If it is severe call 911 IMMEDIATELY. If it is mild, please call our office.  We will call to schedule your test 2-4 weeks out understanding that some insurance companies will need an authorization prior to the service being performed.   For more information and frequently asked questions, please visit our website : [email or web address]  For non-scheduling related questions, please contact the cardiac imaging nurse navigator should you have any questions/concerns: Cardiac Imaging Nurse Navigators Direct Office Dial: [phone]   For scheduling needs, including cancellations and rescheduling, please call [place], [phone].   Follow-Up: At [facility], you and your health needs are our priority.  As part of our continuing mission to provide you with exceptional heart care, our providers are all part of one team.  This team includes your primary Cardiologist (physician) and Advanced Practice Providers or APPs (Physician Assistants and  Nurse Practitioners) who all work together to provide you with the care you need, when you need it.  Your next appointment:   8 week(s)  Provider:   Joni Reining, DNP, ANP      We recommend signing up for the patient  portal called "MyChart".  Sign up information is provided on this After Visit Summary.  MyChart is used to connect with patients for Virtual Visits (Telemedicine).  Patients are able to view lab/test results, encounter notes, upcoming appointments, etc.  Non-urgent messages can be sent to your provider as well.   To learn more about what you can do with MyChart, go to [email or web address].   Other Instructions none      1st Floor: - Lobby - Registration  - Pharmacy  - Lab - Cafe  2nd Floor: - PV Lab - Diagnostic Testing (echo, CT, nuclear med)  3rd Floor: - Vacant  4th Floor: - TCTS (cardiothoracic surgery) - AFib Clinic - Structural Heart Clinic - Vascular Surgery  - Vascular Ultrasound  5th Floor: - HeartCare Cardiology (general and EP) - Clinical Pharmacy for coumadin, hypertension, lipid, weight-loss medications, and med management appointments    Valet parking services will be available as well.

## 2023-05-23 NOTE — Telephone Encounter (Signed)
 Called and spoke with patient. Pt states he feels palpitations, increased HR low 100s and increased BP today. Checked vitals on machine at work and they sent him home for the day. Chart reviewed has hx of SVT. Appointment made with PA-C for this morning at 10:05 am.

## 2023-05-23 NOTE — Progress Notes (Signed)
 Cardiology Office Note:  .   Date:  05/23/2023  ID:  Taylor Kidd, DOB [birth year], MRN [id number] PCP: Taylor Seller, NP  [facility] Providers Cardiologist:  Taylor Millers, MD {   History of Present Illness: .   Taylor Kidd is a 53 y.o. male with a history of mild coronary artery calcifications with a coronary calcium score of 1 in 11/2021, palpitations, borderline dilatation of aortic root, hyperlipidemia, GERD, and anxiety/ depression who is followed by Dr. Jens Kidd. He presents today for a same day appointment for evaluation of tachycardia, hypertension.   Per chart review, patient was first seen by Dr. Jens Kidd on 06/2021 for evaluation of palpitations.  He reported having intermittent palpitations for the past 5 years lasting 15 seconds - 1 minute at a time.  Echo showed LVEF 54% with mild LVH, normal wall motion, normal diastolic parameters, borderline dilatation of the aortic root measuring 41 mm.  Patient was seen by Dr. Jens Kidd in 11/2021, patient reported dyspnea with vigorous activity and occasional brief palpitations at that time.  Coronary calcium score was ordered to help assess with risk stratification given hyperlipidemia, family history of CAD.  A sleep study was also ordered given snoring.  CT calcium scoring showed coronary calcium score of 1 (56 percentile).  Sleep study was positive for OSA and he was started on CPAP.  In 12/2022, patient was again seen by cardiology for routine follow-up.  At that time, patient continues to have occasional brief palpitations where his heart rate would increase to the low 200s.  Episodes typically lasted only 3-5 seconds, resolved with taking a few deep breaths.  He was started on metoprolol succinate 25 mg daily and educated on vagal maneuvers.  Of note, patient had not started using his CPAP at that time.  Patient was last seen by Dr. [place] on 03/20/2023 for evaluation of tachycardia.  Patient had been driving a few days  prior when he felt his heart fluttering.  His Apple Watch showed SVT with heart rate up to 200.  Symptoms lasted for about an hour.  At that time, patient had not been taking Toprol XL regularly.  He was instructed to take his Toprol XL daily and reduce caffeine intake, vagal maneuvers were reviewed.  Today, patient presents after he had an episode of dizziness, high blood pressure this AM. He reports that he was walking into work this AM and he did not feel very well. Started to feel a bit dizzy, so he checked his BP which was elevated to 166/97. He does not check his BP regularly at home, but to the best of his knowledge his BP has been well controlled. BP is well controlled in clinic today.   Patient also reports that he has continued to experience chest fluttering that lasts approximately 10 seconds and is associated with feelings of dizziness. The patient is currently on metoprolol daily. He does occasionally forget to take his metoprolol, and estimates that he takes it about 3-4 times a week. He does notice more fluttering on days when the medication is forgotten. He wears his apple watch and often captures EKGs when he feels palpitations. EKGs on apple watch show SVT.  He denies any sustained episodes of tachycardia.   In addition to these symptoms, the patient also reports occasional mild chest pain and shortness of breath during exertion, such as walking up steps. These symptoms tend to resolve after a period of rest. Have been increasing in frequency recently.  The patient  also mentions taking tramadol for back pain and has a history of smoking, although it is limited to about a pack a month, primarily during social activities like golfing. Patient reports that he has not been taking his lipitor and needs a refill.   ROS: per HPI   Studies Reviewed: .   Cardiac Studies & Procedures   ______________________________________________________________________________________________      ECHOCARDIOGRAM  ECHOCARDIOGRAM COMPLETE 08/09/2021  Narrative ECHOCARDIOGRAM REPORT    Patient Name:   Taylor Kidd Date of Exam: 08/09/2021 Medical Rec #:  [id number]     Height:       75.0 in Accession #:    [id number]    Weight:       257.8 lb Date of Birth:  20-Apr-1970     BSA:          2.444 m Patient Age:    51 years      BP:           130/84 mmHg Patient Gender: M             HR:           78 bpm. Exam Location:  [address]  Procedure: 2D Echo, 3D Echo, Cardiac Doppler, Color Doppler and Strain Analysis  Indications:    R00.2 Palpitation  History:        Patient has no prior history of Echocardiogram examinations. Signs/Symptoms:Syncope; Risk Factors:Dyslipidemia and Former Smoker.  Sonographer:    Taylor Kidd BS, RDCS Referring Phys: [id number] Taylor Kidd  IMPRESSIONS   1. Left ventricular ejection fraction by 3D volume is 54 %. The left ventricle has normal function. The left ventricle has no regional wall motion abnormalities. There is mild left ventricular hypertrophy of the septal segment. Left ventricular diastolic parameters were normal. The average left ventricular global longitudinal strain is -22.3 %. The global longitudinal strain is normal. 2. Right ventricular systolic function is normal. The right ventricular size is normal. Tricuspid regurgitation signal is inadequate for assessing PA pressure. 3. The mitral valve is normal in structure. Trivial mitral valve regurgitation. No evidence of mitral stenosis. 4. The aortic valve is tricuspid. There is mild calcification of the aortic valve. Aortic valve regurgitation is not visualized. No aortic stenosis is present. 5. Aortic dilatation noted. There is borderline dilatation, when indexed to BSA, of the aortic root, measuring 41 mm. 6. The inferior vena cava is normal in size with greater than 50% respiratory variability, suggesting right atrial pressure of 3 mmHg.  FINDINGS Left Ventricle: Left ventricular  ejection fraction by 3D volume is 54 %. The left ventricle has normal function. The left ventricle has no regional wall motion abnormalities. The average left ventricular global longitudinal strain is -22.3 %. The global longitudinal strain is normal. The left ventricular internal cavity size was normal in size. There is mild left ventricular hypertrophy of the septal segment. Left ventricular diastolic parameters were normal.  Right Ventricle: The right ventricular size is normal. No increase in right ventricular wall thickness. Right ventricular systolic function is normal. Tricuspid regurgitation signal is inadequate for assessing PA pressure.  Left Atrium: Left atrial size was normal in size.  Right Atrium: Right atrial size was normal in size.  Pericardium: There is no evidence of pericardial effusion.  Mitral Valve: The mitral valve is normal in structure. Trivial mitral valve regurgitation. No evidence of mitral valve stenosis.  Tricuspid Valve: The tricuspid valve is normal in structure. Tricuspid valve regurgitation is trivial. No evidence of tricuspid  stenosis.  Aortic Valve: The aortic valve is tricuspid. There is mild calcification of the aortic valve. Aortic valve regurgitation is not visualized. No aortic stenosis is present.  Pulmonic Valve: The pulmonic valve was normal in structure. Pulmonic valve regurgitation is trivial. No evidence of pulmonic stenosis.  Aorta: Aortic dilatation noted. There is borderline dilatation of the aortic root, measuring 41 mm.  Venous: The inferior vena cava is normal in size with greater than 50% respiratory variability, suggesting right atrial pressure of 3 mmHg.  IAS/Shunts: No atrial level shunt detected by color flow Doppler.   LEFT VENTRICLE PLAX 2D LVIDd:         5.20 cm         Diastology LVIDs:         3.60 cm         LV e' medial:    7.62 cm/s LV PW:         0.90 cm         LV E/e' medial:  5.6 LV IVS:        1.10 cm         LV e'  lateral:   9.57 cm/s LVOT diam:     2.50 cm         LV E/e' lateral: 4.5 LV SV:         96 LV SV Index:   39              2D LVOT Area:     4.91 cm        Longitudinal Strain 2D Strain GLS  -26.6 % (A2C): 2D Strain GLS  -20.8 % (A3C): 2D Strain GLS  -19.5 % (A4C): 2D Strain GLS  -22.3 % Avg:  3D Volume EF LV 3D EF:    Left ventricul ar ejection fraction by 3D volume is 54 %.  3D Volume EF: 3D EF:        54 % LV EDV:       118 ml LV ESV:       55 ml LV SV:        63 ml  RIGHT VENTRICLE             IVC RV Basal diam:  3.60 cm     IVC diam: 1.70 cm RV S prime:     13.60 cm/s TAPSE (M-mode): 2.0 cm  LEFT ATRIUM             Index        RIGHT ATRIUM           Index LA diam:        3.80 cm 1.55 cm/m   RA Pressure: 3.00 mmHg LA Vol (A2C):   43.3 ml 17.71 ml/m  RA Area:     15.90 cm LA Vol (A4C):   38.4 ml 15.71 ml/m  RA Volume:   43.00 ml  17.59 ml/m LA Biplane Vol: 41.9 ml 17.14 ml/m AORTIC VALVE LVOT Vmax:   110.00 cm/s LVOT Vmean:  68.800 cm/s LVOT VTI:    0.195 m  AORTA Ao Root diam: 4.10 cm Ao Asc diam:  3.30 cm  MITRAL VALVE               TRICUSPID VALVE Estimated RAP:  3.00 mmHg MV Decel Time: 254 msec MV E velocity: 43.00 cm/s  SHUNTS MV A velocity: 53.70 cm/s  Systemic VTI:  0.20 m MV E/A ratio:  0.80  Systemic Diam: 2.50 cm  Weston Brass MD Electronically signed by Weston Brass MD Signature Date/Time: 08/09/2021/11:05:45 AM    Final      CT SCANS  CT CARDIAC SCORING (SELF PAY ONLY) 12/07/2021  Addendum 12/07/2021 11:00 AM ADDENDUM REPORT: 12/07/2021 10:58  EXAM: OVER-READ INTERPRETATION  CT CHEST  The following report is an over-read performed by radiologist Dr. Maryelizabeth [facility] on 12/07/2021. This over-read does not include interpretation of cardiac or coronary anatomy or pathology. The calcium score interpretation by the cardiologist is attached.  COMPARISON:   None.  FINDINGS: Limited view of the lung parenchyma demonstrates no suspicious nodularity. Airways are normal.  Limited view of the mediastinum demonstrates no adenopathy. Esophagus normal.  Limited view of the upper abdomen unremarkable.  Limited view of the skeleton and chest wall is unremarkable.  IMPRESSION: No significant extracardiac findings.   Electronically Signed By: Genevive Bi M.D. On: 12/07/2021 10:58  Narrative CLINICAL DATA:  Cardiovascular Disease Risk stratification  EXAM: Coronary Calcium Score  TECHNIQUE: A gated, non-contrast computed tomography scan of the heart was performed using 3mm slice thickness. Axial images were analyzed on a dedicated workstation. Calcium scoring of the coronary arteries was performed using the Agatston method.  FINDINGS: Coronary arteries: Normal origins.  Coronary Calcium Score:  Left main: 0  Left anterior descending artery: 0  Left circumflex artery: 0  Right coronary artery: 1  Total: 1  Percentile: 56  Pericardium: Normal.  Ascending Aorta: Normal caliber. Mild focal aortic arch atherosclerosis.  Non-cardiac: See separate report from [facility].  IMPRESSION: 1. Coronary calcium score of 1. This was 38 percentile for age-, race-, and sex-matched controls.  2.  Mild focal aortic arch atherosclerosis.  RECOMMENDATIONS: Coronary artery calcium (CAC) score is a strong predictor of incident coronary heart disease (CHD) and provides predictive information beyond traditional risk factors. CAC scoring is reasonable to use in the decision to withhold, postpone, or initiate statin therapy in intermediate-risk or selected borderline-risk asymptomatic adults (age 58-75 years and LDL-C >=70 to <190 mg/dL) who do not have diabetes or established atherosclerotic cardiovascular disease (ASCVD).* In intermediate-risk (10-year ASCVD risk >=7.5% to <20%) adults or selected borderline-risk  (10-year ASCVD risk >=5% to <7.5%) adults in whom a CAC score is measured for the purpose of making a treatment decision the following recommendations have been made:  If CAC=0, it is reasonable to withhold statin therapy and reassess in 5 to 10 years, as long as higher risk conditions are absent (diabetes mellitus, family history of premature CHD in first degree relatives (males <55 years; females <65 years), cigarette smoking, or LDL >=190 mg/dL).  If CAC is 1 to 99, it is reasonable to initiate statin therapy for patients >=33 years of age.  If CAC is >=100 or >=75th percentile, it is reasonable to initiate statin therapy at any age.  Cardiology referral should be considered for patients with CAC scores >=400 or >=75th percentile.  *2018 AHA/ACC/AACVPR/AAPA/ABC/ACPM/ADA/AGS/APhA/ASPC/NLA/PCNA Guideline on the Management of Blood Cholesterol: A Report of the American College of Cardiology/American Heart Association Task Force on Clinical Practice Guidelines. J Am Coll Cardiol. 2019;73(24):3168-3209.  Electronically Signed: By: Donato Schultz M.D. On: 12/07/2021 09:49     ______________________________________________________________________________________________      Risk Assessment/Calculations:      Physical Exam:   VS:  BP 136/88 (BP Location: Left Arm, Patient Position: Sitting, Cuff Size: Normal)   Pulse 63   Ht 6\' 3"  (1.905 m)   Wt 256 lb 3.2 oz (  116.2 kg)   SpO2 91%   BMI 32.02 kg/m    Wt Readings from Last 3 Encounters:  05/23/23 256 lb 3.2 oz (116.2 kg)  03/20/23 258 lb (117 kg)  01/03/23 255 lb 12.8 oz (116 kg)    GEN: Well nourished, well developed in no acute distress. Sitting comfortably on the exam table  NECK: No JVD  CARDIAC: RRR, no murmurs, rubs, gallops. Radial pulses 2+ bilaterally  RESPIRATORY:  Clear to auscultation without rales, wheezing or rhonchi. Normal WOB on room air  ABDOMEN: Soft, non-tender, non-distended EXTREMITIES:  No edema  in BLE; No deformity   ASSESSMENT AND PLAN: .    SVT  Palpitations - Patient has been followed by Dr. Jens Kidd for palpitations. Has been wearing an apple watch, which in the past has detected SVT  - He has been on metoprolol succinate 25 mg daily. Does occasionally forget to take his medication, estimates that he takes it about 4 days a week. Palpitations are more significant on days that he does not take metoprolol  - He continues to have brief episodes of palpitations. Episodes last around 10 seconds. Resolve if he takes a few deep breaths  - Instructed patient to make sure he is taking his metoprolol succinate 25 mg daily. Instructed him to take an extra 12.5 mg (half tablet) if he does have a sustained episode of palpitations, or if he has multiple brief episodes of palpitations in one day  - Discussed possible zio, but patient wears his apple watch consistently and captures EKGs whenever he has palpitations. For now, he would prefer to continue monitoring with apple watch  - Discussed importance of staying hydrated, avoiding caffeine use, compliance with metoprolol   Mild coronary artery calcifications  Stable Angina  - CT cardiac scoring in 11/2021 showed a coronary calcium score of 1 (56 percentile)  - Today, patient reports that he has been having chest pain and shortness of breath on exertion. Symptoms are relieved with rest. He does not develop chest pain every time he exerts himself, but has been happening more frequently recently  - EKG today shows NSR, no ischemic changes  - Ordered coronary CTA to better assess CAD  - Ordered BMP prior to scan  - Restart lipitor 20 mg daily   HLD  - Lipid panel from 12/2022 showed LDL 137  - Patient reports that he ran out of his lipitor and has not been taking it - Restart lipitor 20 mg daily - Will need lipid panel, LFTs in 2-3 months   Elevated BP Dizziness  - Patient had an isolated episode of dizziness, high BP when walking into work  this AM. Reports that he did not feel well, started to feel a bit lightheaded. He checked his BP which was elevated to 166/97 - Patient has not been monitoring his BP at home. Per review of recent vital signs at clinic appointments, BP has been well controlled  - I suspect that patient's BP was elevated because he was feeling poorly this AM. BP normal in clinic today  - Instructed patient to keep a BP log for 2 weeks  - Continue metoprolol succinate 25 mg daily     Dispo: follow up with APP in 8 weeks   Signed, Jonita Albee, PA-C

## 2023-06-06 ENCOUNTER — Telehealth (HOSPITAL_COMMUNITY): Payer: Self-pay | Admitting: *Deleted

## 2023-06-06 NOTE — Telephone Encounter (Signed)
 Reaching out to patient to offer assistance regarding upcoming cardiac imaging study; pt verbalizes understanding of appt date/time, but wishes to reschedule. He was not able to get time off work. New appt made for May 27 at 9 AM.    Asked patient about his current HR. He states it is about 82 bpm and said he was instructed by the office to take an extra dose of metoprolol on the day of his test.  Chase Copping RN Navigator Cardiac Imaging [facility] Heart and Vascular [phone] office [phone] cell

## 2023-06-07 ENCOUNTER — Ambulatory Visit (HOSPITAL_COMMUNITY): Admission: RE | Admit: 2023-06-07 | Source: Ambulatory Visit

## 2023-06-19 ENCOUNTER — Other Ambulatory Visit: Payer: Self-pay | Admitting: Gastroenterology

## 2023-06-19 DIAGNOSIS — R1013 Epigastric pain: Secondary | ICD-10-CM

## 2023-07-14 ENCOUNTER — Telehealth (HOSPITAL_COMMUNITY): Payer: Self-pay | Admitting: Emergency Medicine

## 2023-07-14 NOTE — Telephone Encounter (Signed)
 Pt r/s for 6/9 at 8:30a Taylor Mount RN Navigator Cardiac Imaging [facility] [phone] Office  [phone] Cell

## 2023-07-18 ENCOUNTER — Ambulatory Visit (HOSPITAL_COMMUNITY)

## 2023-07-31 ENCOUNTER — Ambulatory Visit (HOSPITAL_COMMUNITY): Admission: RE | Admit: 2023-07-31 | Source: Ambulatory Visit

## 2023-08-07 NOTE — Progress Notes (Signed)
 Cardiology Office Note   Date:  08/21/2023  ID:  Taylor Kidd, DOB Jul 07, 1970, MRN [id number] PCP: Taylor Harlene POUR, NP  [facility] Providers Cardiologist:  Taylor Shallow, MD   History of Present Illness Taylor Kidd is a 53 y.o. male with a history of mild coronary artery calcifications with a coronary calcium  score of 1 in 11/2021, palpitations, borderline dilatation of aortic root, hyperlipidemia, GERD, and anxiety/ depression who is followed by Taylor Kidd. He presents today for SVT follow up.    Per chart review, patient was first seen by Taylor Kidd on 06/2021 for evaluation of palpitations.  He reported having intermittent palpitations for the past 5 years lasting 15 seconds - 1 minute at a time.  Echo showed LVEF 54% with mild LVH, normal wall motion, normal diastolic parameters, borderline dilatation of the aortic root measuring 41 mm.   Patient was seen by Taylor Kidd in 11/2021, patient reported dyspnea with vigorous activity and occasional brief palpitations at that time.  Coronary calcium  score was ordered to help assess with risk stratification given hyperlipidemia, family history of CAD.  A sleep study was also ordered given snoring.  CT calcium  scoring showed coronary calcium  score of 1 (56 percentile).  Sleep study was positive for OSA and he was started on CPAP.   In 12/2022, patient was again seen by cardiology for routine follow-up.  At that time, patient continues to have occasional brief palpitations where his heart rate would increase to the low 200s.  Episodes typically lasted only 3-5 seconds, resolved with taking a few deep breaths.  He was started on metoprolol  succinate 25 mg daily and educated on vagal maneuvers.  Of note, patient had not started using his CPAP at that time.   Patient was last seen by Taylor Kidd on 03/20/2023 for evaluation of tachycardia.  Patient had been driving a few days prior when he felt his heart fluttering.  His Apple Watch  showed SVT with heart rate up to 200.  Symptoms lasted for about an hour.  At that time, patient had not been taking Toprol  XL regularly.  He was instructed to take his Toprol  XL daily and reduce caffeine intake, vagal maneuvers were reviewed. I saw him in clinic on 05/23/23. At that time, patient reported having an episode of dizziness and high BP. He also reported occasional, brief episodes of palpitations that seemed to be more significant when he forgot his metoprolol . He was encouraged to take his metoprolol  consistently. He also complained of chest discomfort that was relieved by rest. Coronary CTA ordered but was not completed  Today, patient reports that he has been doing well since last being seen.  He has not had any recurrence of chest pain.  Palpitations are improved.  Denies shortness of breath, dizziness, syncope, near syncope.  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  He continues to wear his Apple Watch and has not had any alerts for elevated heart rate recently.  Yesterday he had a very brief episode of palpitations that only lasted a few seconds.  Otherwise has been doing well.  He has been compliant with his metoprolol .  Does occasionally miss doses of his statin.  He has a history of sleep apnea but has not picked up his CPAP machine  Studies Reviewed Cardiac Studies & Procedures   ______________________________________________________________________________________________     ECHOCARDIOGRAM  ECHOCARDIOGRAM COMPLETE 08/09/2021  Narrative ECHOCARDIOGRAM REPORT  Patient Name:   Taylor Kidd Date of Exam: 08/09/2021 Medical Rec #:  [id number]     Height:       75.0 in Accession #:    [id number]    Weight:       257.8 lb Date of Birth:  08-Aug-1970     BSA:          2.444 m Patient Age:    51 years      BP:           130/84 mmHg Patient Gender: M             HR:           78 bpm. Exam Location:  [address]  Procedure: 2D Echo, 3D Echo, Cardiac Doppler, Color Doppler and Strain Analysis  Indications:    R00.2 Palpitation  History:        Patient has no prior history of Echocardiogram examinations. Signs/Symptoms:Syncope; Risk Factors:Dyslipidemia and Former Smoker.  Sonographer:    Taylor Kidd BS, RDCS Referring Phys: [id number] Taylor Kidd  IMPRESSIONS   1. Left ventricular ejection fraction by 3D volume is 54 %. The left ventricle has normal function. The left ventricle has no regional wall motion abnormalities. There is mild left ventricular hypertrophy of the septal segment. Left ventricular diastolic parameters were normal. The average left ventricular global longitudinal strain is -22.3 %. The global longitudinal strain is normal. 2. Right ventricular systolic function is normal. The right ventricular size is normal. Tricuspid regurgitation signal is inadequate for assessing PA pressure. 3. The mitral valve is normal in structure. Trivial mitral valve regurgitation. No evidence of mitral stenosis. 4. The aortic valve is tricuspid. There is mild calcification of the aortic valve. Aortic valve regurgitation is not visualized. No aortic stenosis is present. 5. Aortic dilatation noted. There is borderline dilatation, when indexed to BSA, of the aortic root, measuring 41 mm. 6. The inferior vena cava is normal in size with greater than 50% respiratory variability, suggesting right atrial pressure of 3 mmHg.  FINDINGS Left Ventricle: Left ventricular ejection fraction by 3D volume is 54 %. The left ventricle has normal function. The left ventricle has no regional wall motion abnormalities. The average left ventricular global longitudinal strain is -22.3 %. The global longitudinal strain is normal. The left ventricular internal cavity size was normal in size. There is mild left ventricular hypertrophy of the septal segment. Left ventricular diastolic parameters were normal.  Right  Ventricle: The right ventricular size is normal. No increase in right ventricular wall thickness. Right ventricular systolic function is normal. Tricuspid regurgitation signal is inadequate for assessing PA pressure.  Left Atrium: Left atrial size was normal in size.  Right Atrium: Right atrial size was normal in size.  Pericardium: There is no evidence of pericardial effusion.  Mitral Valve: The mitral valve is normal in structure. Trivial mitral valve regurgitation. No evidence of mitral valve stenosis.  Tricuspid Valve: The tricuspid valve is normal in structure. Tricuspid valve regurgitation is trivial. No evidence of tricuspid stenosis.  Aortic Valve: The aortic valve is tricuspid. There is mild calcification of the aortic valve. Aortic valve regurgitation is not visualized. No aortic stenosis is present.  Pulmonic Valve: The pulmonic valve was normal in structure. Pulmonic valve regurgitation is trivial. No evidence of pulmonic stenosis.  Aorta: Aortic dilatation noted. There is borderline dilatation of the aortic root, measuring 41 mm.  Venous: The inferior vena cava is normal in size with greater than 50%  respiratory variability, suggesting right atrial pressure of 3 mmHg.  IAS/Shunts: No atrial level shunt detected by color flow Doppler.   LEFT VENTRICLE PLAX 2D LVIDd:         5.20 cm         Diastology LVIDs:         3.60 cm         LV e' medial:    7.62 cm/s LV PW:         0.90 cm         LV E/e' medial:  5.6 LV IVS:        1.10 cm         LV e' lateral:   9.57 cm/s LVOT diam:     2.50 cm         LV E/e' lateral: 4.5 LV SV:         96 LV SV Index:   39              2D LVOT Area:     4.91 cm        Longitudinal Strain 2D Strain GLS  -26.6 % (A2C): 2D Strain GLS  -20.8 % (A3C): 2D Strain GLS  -19.5 % (A4C): 2D Strain GLS  -22.3 % Avg:  3D Volume EF LV 3D EF:    Left ventricul ar ejection fraction by 3D volume is 54 %.  3D Volume EF: 3D EF:        54  % LV EDV:       118 ml LV ESV:       55 ml LV SV:        63 ml  RIGHT VENTRICLE             IVC RV Basal diam:  3.60 cm     IVC diam: 1.70 cm RV S prime:     13.60 cm/s TAPSE (M-mode): 2.0 cm  LEFT ATRIUM             Index        RIGHT ATRIUM           Index LA diam:        3.80 cm 1.55 cm/m   RA Pressure: 3.00 mmHg LA Vol (A2C):   43.3 ml 17.71 ml/m  RA Area:     15.90 cm LA Vol (A4C):   38.4 ml 15.71 ml/m  RA Volume:   43.00 ml  17.59 ml/m LA Biplane Vol: 41.9 ml 17.14 ml/m AORTIC VALVE LVOT Vmax:   110.00 cm/s LVOT Vmean:  68.800 cm/s LVOT VTI:    0.195 m  AORTA Ao Root diam: 4.10 cm Ao Asc diam:  3.30 cm  MITRAL VALVE               TRICUSPID VALVE Estimated RAP:  3.00 mmHg MV Decel Time: 254 msec MV E velocity: 43.00 cm/s  SHUNTS MV A velocity: 53.70 cm/s  Systemic VTI:  0.20 m MV E/A ratio:  0.80        Systemic Diam: 2.50 cm  Soyla Merck MD Electronically signed by Soyla Merck MD Signature Date/Time: 08/09/2021/11:05:45 AM    Final      CT SCANS  CT CARDIAC SCORING (SELF PAY ONLY) 12/07/2021  Addendum 12/07/2021 11:00 AM ADDENDUM REPORT: 12/07/2021 10:58  EXAM: OVER-READ INTERPRETATION  CT CHEST  The following report is an over-read performed by radiologist Dr. Jackquline Skates [facility] on 12/07/2021. This over-read does not include interpretation of  cardiac or coronary anatomy or pathology. The calcium  score interpretation by the cardiologist is attached.  COMPARISON:  None.  FINDINGS: Limited view of the lung parenchyma demonstrates no suspicious nodularity. Airways are normal.  Limited view of the mediastinum demonstrates no adenopathy. Esophagus normal.  Limited view of the upper abdomen unremarkable.  Limited view of the skeleton and chest wall is unremarkable.  IMPRESSION: No significant extracardiac findings.   Electronically Signed By: Jackquline Boxer M.D. On: 12/07/2021  10:58  Narrative CLINICAL DATA:  Cardiovascular Disease Risk stratification  EXAM: Coronary Calcium  Score  TECHNIQUE: A gated, non-contrast computed tomography scan of the heart was performed using 3mm slice thickness. Axial images were analyzed on a dedicated workstation. Calcium  scoring of the coronary arteries was performed using the Agatston method.  FINDINGS: Coronary arteries: Normal origins.  Coronary Calcium  Score:  Left main: 0  Left anterior descending artery: 0  Left circumflex artery: 0  Right coronary artery: 1  Total: 1  Percentile: 56  Pericardium: Normal.  Ascending Aorta: Normal caliber. Mild focal aortic arch atherosclerosis.  Non-cardiac: See separate report from [facility].  IMPRESSION: 1. Coronary calcium  score of 1. This was 2 percentile for age-, race-, and sex-matched controls.  2.  Mild focal aortic arch atherosclerosis.  RECOMMENDATIONS: Coronary artery calcium  (CAC) score is a strong predictor of incident coronary heart disease (CHD) and provides predictive information beyond traditional risk factors. CAC scoring is reasonable to use in the decision to withhold, postpone, or initiate statin therapy in intermediate-risk or selected borderline-risk asymptomatic adults (age 96-75 years and LDL-C >=70 to <190 mg/dL) who do not have diabetes or established atherosclerotic cardiovascular disease (ASCVD).* In intermediate-risk (10-year ASCVD risk >=7.5% to <20%) adults or selected borderline-risk (10-year ASCVD risk >=5% to <7.5%) adults in whom a CAC score is measured for the purpose of making a treatment decision the following recommendations have been made:  If CAC=0, it is reasonable to withhold statin therapy and reassess in 5 to 10 years, as long as higher risk conditions are absent (diabetes mellitus, family history of premature CHD in first degree relatives (males <55 years; females <65 years), cigarette smoking, or  LDL >=190 mg/dL).  If CAC is 1 to 99, it is reasonable to initiate statin therapy for patients >=62 years of age.  If CAC is >=100 or >=75th percentile, it is reasonable to initiate statin therapy at any age.  Cardiology referral should be considered for patients with CAC scores >=400 or >=75th percentile.  *2018 AHA/ACC/AACVPR/AAPA/ABC/ACPM/ADA/AGS/APhA/ASPC/NLA/PCNA Guideline on the Management of Blood Cholesterol: A Report of the American College of Cardiology/American Heart Association Task Force on Clinical Practice Guidelines. J Am Coll Cardiol. 2019;73(24):3168-3209.  Electronically Signed: By: Oneil Parchment M.D. On: 12/07/2021 09:49     ______________________________________________________________________________________________      Risk Assessment/Calculations   Physical Exam VS:  BP 112/76   Pulse 84   Ht 6' 3 (1.905 m)   Wt 253 lb 6.4 oz (114.9 kg)   SpO2 96%   BMI 31.67 kg/m    Wt Readings from Last 3 Encounters:  08/21/23 253 lb 6.4 oz (114.9 kg)  05/23/23 256 lb 3.2 oz (116.2 kg)  03/20/23 258 lb (117 kg)    GEN: Well nourished, well developed in no acute distress. Sitting upright on the exam table  NECK: No JVD  CARDIAC:  RRR, no murmurs, rubs, gallops. Radial pulses 2+ bilaterally  RESPIRATORY:  Clear to auscultation without rales, wheezing or rhonchi. Normal WOB on room air  ABDOMEN: Soft, non-tender, non-distended EXTREMITIES:  No edema in BLE; No deformity   ASSESSMENT AND PLAN  SVT  Palpitations - Patient has been followed by Dr. Pietro for palpitations. Has been wearing an apple watch, which in the past has detected SVT  - He has been on metoprolol  succinate 25 mg daily. In the past had issues with compliance. Now reports excellent compliance  - Palpitations have improved overall. His apple watch has not alerted for elevated HR recently. If he does have palpitations, they are brief and mild - Continue metoprolol  succinate 25 mg daily  -  Discussed importance of staying hydrated, avoiding caffeine use, compliance with metoprolol . 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  Coronary CTA was ordered but patient did not have it completed due to cost  - Today, patient denies recurrence of chest pain. Denies DOE. No indication for coronary CTA at this time as symptoms have resolved  - Continue lipitor 20 mg daily    HLD  - Lipid panel from 12/2022 showed LDL 137  - Continue lipitor 20 mg daily - patient reports often missing doses. We discussed compliance  - Ordered repeat lipid panel for monitoring   HTN  - BP well controlled. Denies dizziness, syncope, near syncope  - Continue metoprolol  succinate 25 mg daily   OSA  - Was prescribed a CPAP in 03/2022, but never picked it up  - Recommended compliance with CPAP- patient plans to call the company who contacted him about CPAP  in the past to arrange   Dispo: Follow up in 6 months with Dr. Pietro   Signed, Rollo FABIENE Louder, PA-C

## 2023-08-21 ENCOUNTER — Ambulatory Visit: Attending: Cardiology | Admitting: Cardiology

## 2023-08-21 ENCOUNTER — Encounter: Payer: Self-pay | Admitting: Cardiology

## 2023-08-21 VITALS — BP 112/76 | HR 84 | Ht 75.0 in | Wt 253.4 lb

## 2023-08-21 DIAGNOSIS — R002 Palpitations: Secondary | ICD-10-CM | POA: Diagnosis not present

## 2023-08-21 DIAGNOSIS — I471 Supraventricular tachycardia, unspecified: Secondary | ICD-10-CM | POA: Diagnosis not present

## 2023-08-21 DIAGNOSIS — I2510 Atherosclerotic heart disease of native coronary artery without angina pectoris: Secondary | ICD-10-CM

## 2023-08-21 DIAGNOSIS — G4733 Obstructive sleep apnea (adult) (pediatric): Secondary | ICD-10-CM

## 2023-08-21 DIAGNOSIS — E785 Hyperlipidemia, unspecified: Secondary | ICD-10-CM

## 2023-08-21 LAB — LIPID PANEL
Chol/HDL Ratio: 4.5 ratio (ref 0.0–5.0)
Cholesterol, Total: 195 mg/dL (ref 100–199)
HDL: 43 mg/dL (ref >39)
LDL Chol Calc (NIH): 125 mg/dL — ABNORMAL HIGH (ref 0–99)
Triglycerides: 153 mg/dL — ABNORMAL HIGH (ref 0–149)
VLDL Cholesterol Cal: 27 mg/dL (ref 5–40)

## 2023-08-21 NOTE — Patient Instructions (Signed)
 Medication Instructions:  No changes *If you need a refill on your cardiac medications before your next appointment, please call your pharmacy*  Lab Work: Today we are going to draw a Lipid panel If you have labs (blood work) drawn today and your tests are completely normal, you will receive your results only by: MyChart Message (if you have MyChart) OR A paper copy in the mail If you have any lab test that is abnormal or we need to change your treatment, we will call you to review the results.  Testing/Procedures: No testing  Follow-Up: At [facility], you and your health needs are our priority.  As part of our continuing mission to provide you with exceptional heart care, our providers are all part of one team.  This team includes your primary Cardiologist (physician) and Advanced Practice Providers or APPs (Physician Assistants and Nurse Practitioners) who all work together to provide you with the care you need, when you need it.  Your next appointment:   7 month(s)  Provider:   Redell Shallow, MD    We recommend signing up for the patient portal called MyChart.  Sign up information is provided on this After Visit Summary.  MyChart is used to connect with patients for Virtual Visits (Telemedicine).  Patients are able to view lab/test results, encounter notes, upcoming appointments, etc.  Non-urgent messages can be sent to your provider as well.   To learn more about what you can do with MyChart, go to [email or web address].

## 2023-08-22 ENCOUNTER — Ambulatory Visit: Payer: Self-pay | Admitting: Cardiology

## 2023-08-30 ENCOUNTER — Other Ambulatory Visit: Payer: Self-pay | Admitting: Gastroenterology

## 2023-08-30 DIAGNOSIS — R1013 Epigastric pain: Secondary | ICD-10-CM

## 2023-09-04 ENCOUNTER — Encounter (HOSPITAL_COMMUNITY): Payer: Self-pay

## 2023-11-01 ENCOUNTER — Telehealth: Payer: Self-pay | Admitting: Cardiology

## 2023-11-01 DIAGNOSIS — R0683 Snoring: Secondary | ICD-10-CM

## 2023-11-01 DIAGNOSIS — G4733 Obstructive sleep apnea (adult) (pediatric): Secondary | ICD-10-CM

## 2023-11-01 NOTE — Telephone Encounter (Signed)
 Pt would like a c/b regarding obtaining a CPAP machine. Please advise

## 2023-11-14 NOTE — Telephone Encounter (Signed)
 Called [facility]) and checked into your cpap order from 03/2022 and the company had tried several times to reach you for cpap setup. They were able to connect with you once and you were working and said you would call back so they waited but you never called back so on 05/2022 your order was voided. They are going to try to reactivate your order. I updated the cpap order and you saw Rollo Louder in 07/2023. They will submit that information and if anything else is needed they will contact me.

## 2023-11-14 NOTE — Addendum Note (Signed)
 Addended by: JOSHUA DALTON MATSU on: 11/14/2023 01:49 PM   Modules accepted: Orders

## 2023-11-26 ENCOUNTER — Other Ambulatory Visit: Payer: Self-pay | Admitting: Gastroenterology

## 2023-11-26 DIAGNOSIS — R1013 Epigastric pain: Secondary | ICD-10-CM

## 2024-01-09 ENCOUNTER — Other Ambulatory Visit: Payer: Self-pay | Admitting: Student
